# Patient Record
Sex: Female | Born: 1935 | Race: White | Hispanic: No | Smoking: Former smoker
Health system: Southern US, Community
[De-identification: ages and names within clinical notes are randomized; demographics above are authoritative.]

## PROBLEM LIST (undated history)

## (undated) DIAGNOSIS — M1990 Unspecified osteoarthritis, unspecified site: Secondary | ICD-10-CM

## (undated) DIAGNOSIS — I429 Cardiomyopathy, unspecified: Secondary | ICD-10-CM

## (undated) DIAGNOSIS — IMO0001 Reserved for inherently not codable concepts without codable children: Secondary | ICD-10-CM

## (undated) DIAGNOSIS — Z95810 Presence of automatic (implantable) cardiac defibrillator: Secondary | ICD-10-CM

## (undated) DIAGNOSIS — D649 Anemia, unspecified: Secondary | ICD-10-CM

## (undated) DIAGNOSIS — G2581 Restless legs syndrome: Secondary | ICD-10-CM

## (undated) DIAGNOSIS — I4891 Unspecified atrial fibrillation: Secondary | ICD-10-CM

## (undated) DIAGNOSIS — K219 Gastro-esophageal reflux disease without esophagitis: Secondary | ICD-10-CM

## (undated) DIAGNOSIS — R7881 Bacteremia: Secondary | ICD-10-CM

## (undated) DIAGNOSIS — Z8719 Personal history of other diseases of the digestive system: Secondary | ICD-10-CM

## (undated) DIAGNOSIS — K259 Gastric ulcer, unspecified as acute or chronic, without hemorrhage or perforation: Secondary | ICD-10-CM

## (undated) DIAGNOSIS — I509 Heart failure, unspecified: Secondary | ICD-10-CM

## (undated) DIAGNOSIS — A159 Respiratory tuberculosis unspecified: Secondary | ICD-10-CM

## (undated) DIAGNOSIS — J45909 Unspecified asthma, uncomplicated: Secondary | ICD-10-CM

## (undated) DIAGNOSIS — Z950 Presence of cardiac pacemaker: Secondary | ICD-10-CM

## (undated) DIAGNOSIS — J439 Emphysema, unspecified: Secondary | ICD-10-CM

## (undated) DIAGNOSIS — K5190 Ulcerative colitis, unspecified, without complications: Secondary | ICD-10-CM

## (undated) DIAGNOSIS — K56609 Unspecified intestinal obstruction, unspecified as to partial versus complete obstruction: Secondary | ICD-10-CM

## (undated) DIAGNOSIS — J449 Chronic obstructive pulmonary disease, unspecified: Secondary | ICD-10-CM

## (undated) DIAGNOSIS — K5790 Diverticulosis of intestine, part unspecified, without perforation or abscess without bleeding: Secondary | ICD-10-CM

## (undated) HISTORY — PX: CARDIAC CATHETERIZATION: SHX172

## (undated) HISTORY — PX: CHOLECYSTECTOMY: SHX55

## (undated) HISTORY — PX: EYE SURGERY: SHX253

## (undated) HISTORY — PX: COLON SURGERY: SHX602

---

## 1988-02-26 DIAGNOSIS — I429 Cardiomyopathy, unspecified: Secondary | ICD-10-CM

## 1988-02-26 HISTORY — PX: APPENDECTOMY: SHX54

## 1988-02-26 HISTORY — PX: ABDOMINAL HYSTERECTOMY: SHX81

## 1988-02-26 HISTORY — DX: Cardiomyopathy, unspecified: I42.9

## 1990-02-25 HISTORY — PX: COLOSTOMY: SHX63

## 1991-02-26 HISTORY — PX: COLOSTOMY TAKEDOWN: SHX5783

## 2001-02-25 HISTORY — PX: PACEMAKER INSERTION: SHX728

## 2003-02-22 ENCOUNTER — Other Ambulatory Visit: Payer: Self-pay

## 2003-09-08 ENCOUNTER — Other Ambulatory Visit: Payer: Self-pay

## 2003-11-28 ENCOUNTER — Ambulatory Visit: Payer: Self-pay | Admitting: Unknown Physician Specialty

## 2003-12-23 ENCOUNTER — Inpatient Hospital Stay: Payer: Self-pay

## 2003-12-23 ENCOUNTER — Other Ambulatory Visit: Payer: Self-pay

## 2004-01-23 ENCOUNTER — Other Ambulatory Visit: Payer: Self-pay

## 2004-01-24 ENCOUNTER — Inpatient Hospital Stay: Payer: Self-pay | Admitting: Internal Medicine

## 2004-03-28 ENCOUNTER — Ambulatory Visit: Payer: Self-pay | Admitting: Internal Medicine

## 2004-04-23 ENCOUNTER — Other Ambulatory Visit: Payer: Self-pay

## 2004-04-23 ENCOUNTER — Inpatient Hospital Stay: Payer: Self-pay | Admitting: Internal Medicine

## 2005-02-25 DIAGNOSIS — R7881 Bacteremia: Secondary | ICD-10-CM

## 2005-02-25 HISTORY — PX: VENTRICULAR CARDIAC PACEMAKER INSERTION: SHX836

## 2005-02-25 HISTORY — DX: Bacteremia: R78.81

## 2005-03-04 ENCOUNTER — Ambulatory Visit: Payer: Self-pay

## 2005-03-20 ENCOUNTER — Ambulatory Visit: Payer: Self-pay

## 2005-04-30 ENCOUNTER — Ambulatory Visit: Payer: Self-pay | Admitting: Unknown Physician Specialty

## 2005-05-27 ENCOUNTER — Ambulatory Visit: Payer: Self-pay | Admitting: Internal Medicine

## 2005-07-29 ENCOUNTER — Other Ambulatory Visit: Payer: Self-pay

## 2005-07-29 ENCOUNTER — Inpatient Hospital Stay: Payer: Self-pay | Admitting: Internal Medicine

## 2005-07-30 ENCOUNTER — Other Ambulatory Visit: Payer: Self-pay

## 2005-08-30 ENCOUNTER — Emergency Department: Payer: Self-pay | Admitting: Emergency Medicine

## 2005-11-30 ENCOUNTER — Other Ambulatory Visit: Payer: Self-pay

## 2005-11-30 ENCOUNTER — Inpatient Hospital Stay: Payer: Self-pay | Admitting: Vascular Surgery

## 2006-04-12 ENCOUNTER — Inpatient Hospital Stay: Payer: Self-pay | Admitting: Internal Medicine

## 2006-07-10 ENCOUNTER — Ambulatory Visit: Payer: Self-pay | Admitting: Internal Medicine

## 2006-08-26 ENCOUNTER — Ambulatory Visit: Payer: Self-pay | Admitting: Specialist

## 2007-07-21 ENCOUNTER — Ambulatory Visit: Payer: Self-pay | Admitting: Internal Medicine

## 2007-07-28 ENCOUNTER — Encounter: Payer: Self-pay | Admitting: Specialist

## 2008-12-02 ENCOUNTER — Inpatient Hospital Stay: Payer: Self-pay | Admitting: Cardiology

## 2008-12-02 ENCOUNTER — Ambulatory Visit: Payer: Self-pay | Admitting: Cardiology

## 2008-12-07 ENCOUNTER — Ambulatory Visit: Payer: Self-pay | Admitting: Internal Medicine

## 2009-06-04 ENCOUNTER — Inpatient Hospital Stay: Payer: Self-pay | Admitting: Internal Medicine

## 2010-03-24 ENCOUNTER — Inpatient Hospital Stay: Payer: Self-pay | Admitting: Surgery

## 2010-10-02 ENCOUNTER — Inpatient Hospital Stay: Payer: Self-pay | Admitting: Internal Medicine

## 2010-10-16 ENCOUNTER — Inpatient Hospital Stay: Payer: Self-pay | Admitting: Surgery

## 2011-06-30 ENCOUNTER — Emergency Department: Payer: Self-pay | Admitting: Emergency Medicine

## 2011-08-26 ENCOUNTER — Inpatient Hospital Stay: Payer: Self-pay | Admitting: Internal Medicine

## 2011-08-26 LAB — COMPREHENSIVE METABOLIC PANEL
Albumin: 3.8 g/dL (ref 3.4–5.0)
Alkaline Phosphatase: 91 U/L (ref 50–136)
Anion Gap: 9 (ref 7–16)
BUN: 39 mg/dL — ABNORMAL HIGH (ref 7–18)
Bilirubin,Total: 0.5 mg/dL (ref 0.2–1.0)
Creatinine: 1.17 mg/dL (ref 0.60–1.30)
Glucose: 133 mg/dL — ABNORMAL HIGH (ref 65–99)
SGOT(AST): 32 U/L (ref 15–37)
SGPT (ALT): 26 U/L
Total Protein: 7.9 g/dL (ref 6.4–8.2)

## 2011-08-26 LAB — CBC
HCT: 36.9 % (ref 35.0–47.0)
HGB: 12.2 g/dL (ref 12.0–16.0)
MCH: 32.4 pg (ref 26.0–34.0)
MCHC: 33 g/dL (ref 32.0–36.0)
RDW: 15 % — ABNORMAL HIGH (ref 11.5–14.5)
WBC: 15.9 10*3/uL — ABNORMAL HIGH (ref 3.6–11.0)

## 2011-08-26 LAB — CK TOTAL AND CKMB (NOT AT ARMC)
CK, Total: 63 U/L (ref 21–215)
CK-MB: 1.6 ng/mL (ref 0.5–3.6)

## 2011-08-26 LAB — URINALYSIS, COMPLETE
Bacteria: NONE SEEN
Bilirubin,UR: NEGATIVE
Glucose,UR: NEGATIVE mg/dL (ref 0–75)
Hyaline Cast: 17
Leukocyte Esterase: NEGATIVE
Nitrite: NEGATIVE
Protein: NEGATIVE
RBC,UR: 3 /HPF (ref 0–5)
Specific Gravity: 1.017 (ref 1.003–1.030)
WBC UR: <1 /HPF (ref 0–5)

## 2011-08-26 LAB — MAGNESIUM: Magnesium: 2.3 mg/dL

## 2011-08-26 LAB — DIGOXIN LEVEL: Digoxin: 0.83 ng/mL

## 2011-08-26 LAB — TROPONIN I: Troponin-I: <0.02 ng/mL

## 2011-08-27 LAB — BASIC METABOLIC PANEL
Anion Gap: 6 — ABNORMAL LOW (ref 7–16)
Calcium, Total: 7.5 mg/dL — ABNORMAL LOW (ref 8.5–10.1)
Creatinine: 1 mg/dL (ref 0.60–1.30)
EGFR (African American): >60
EGFR (Non-African Amer.): 52 — ABNORMAL LOW
Glucose: 96 mg/dL (ref 65–99)
Osmolality: 293 (ref 275–301)
Potassium: 4.1 mmol/L (ref 3.5–5.1)
Sodium: 144 mmol/L (ref 136–145)

## 2011-08-27 LAB — CBC WITH DIFFERENTIAL/PLATELET
Basophil %: 0.5 %
MCH: 32.6 pg (ref 26.0–34.0)
MCHC: 32.6 g/dL (ref 32.0–36.0)
Neutrophil %: 68.4 %
Platelet: 126 10*3/uL — ABNORMAL LOW (ref 150–440)
WBC: 7.3 10*3/uL (ref 3.6–11.0)

## 2011-08-28 LAB — RETICULOCYTES: Absolute Retic Count: 0.0338 10*6/uL (ref 0.024–0.084)

## 2011-08-28 LAB — BASIC METABOLIC PANEL
Anion Gap: 5 — ABNORMAL LOW (ref 7–16)
BUN: 12 mg/dL (ref 7–18)
Chloride: 113 mmol/L — ABNORMAL HIGH (ref 98–107)
Creatinine: 0.84 mg/dL (ref 0.60–1.30)
EGFR (Non-African Amer.): >60
Glucose: 93 mg/dL (ref 65–99)
Osmolality: 286 (ref 275–301)
Potassium: 4.3 mmol/L (ref 3.5–5.1)

## 2011-08-28 LAB — CBC WITH DIFFERENTIAL/PLATELET
Basophil #: 0 10*3/uL (ref 0.0–0.1)
Basophil %: 0.3 %
Eosinophil #: 0.1 10*3/uL (ref 0.0–0.7)
Eosinophil %: 1.6 %
HGB: 9.9 g/dL — ABNORMAL LOW (ref 12.0–16.0)
Lymphocyte #: 1.3 10*3/uL (ref 1.0–3.6)
MCH: 33.3 pg (ref 26.0–34.0)
MCHC: 33.2 g/dL (ref 32.0–36.0)
MCV: 100 fL (ref 80–100)
Monocyte #: 0.7 x10 3/mm (ref 0.2–0.9)
Neutrophil %: 56.8 %
Platelet: 116 10*3/uL — ABNORMAL LOW (ref 150–440)
RDW: 14.9 % — ABNORMAL HIGH (ref 11.5–14.5)

## 2011-08-28 LAB — FOLATE: Folic Acid: 54.1 ng/mL (ref 3.1–100.0)

## 2011-08-29 LAB — CBC WITH DIFFERENTIAL/PLATELET
Basophil #: 0 10*3/uL (ref 0.0–0.1)
Basophil %: 0.7 %
Eosinophil %: 2.8 %
Eosinophil: 2 %
HCT: 29.1 % — ABNORMAL LOW (ref 35.0–47.0)
Lymphocyte #: 1.7 10*3/uL (ref 1.0–3.6)
Lymphocyte %: 34.2 %
Lymphocytes: 30 %
MCV: 99 fL (ref 80–100)
Monocyte %: 12.9 %
Neutrophil #: 2.4 10*3/uL (ref 1.4–6.5)
RBC: 2.93 10*6/uL — ABNORMAL LOW (ref 3.80–5.20)
Segmented Neutrophils: 54 %
Variant Lymphocyte - H1-Rlymph: 1 %
WBC: 4.9 10*3/uL (ref 3.6–11.0)

## 2011-08-29 LAB — PROTEIN ELECTROPHORESIS(ARMC)

## 2011-08-30 LAB — BASIC METABOLIC PANEL
Anion Gap: 7 (ref 7–16)
Calcium, Total: 8.8 mg/dL (ref 8.5–10.1)
Co2: 27 mmol/L (ref 21–32)
Creatinine: 0.77 mg/dL (ref 0.60–1.30)
EGFR (African American): >60
Osmolality: 279 (ref 275–301)
Potassium: 4.2 mmol/L (ref 3.5–5.1)
Sodium: 140 mmol/L (ref 136–145)

## 2011-08-30 LAB — CBC WITH DIFFERENTIAL/PLATELET
Basophil #: 0.1 10*3/uL (ref 0.0–0.1)
Basophil %: 0.8 %
Eosinophil #: 0.1 10*3/uL (ref 0.0–0.7)
Eosinophil %: 2 %
HCT: 31.1 % — ABNORMAL LOW (ref 35.0–47.0)
HGB: 10.5 g/dL — ABNORMAL LOW (ref 12.0–16.0)
Lymphocyte #: 2.1 10*3/uL (ref 1.0–3.6)
Lymphocyte %: 31.1 %
MCHC: 33.8 g/dL (ref 32.0–36.0)
Monocyte #: 0.8 x10 3/mm (ref 0.2–0.9)
Neutrophil #: 3.7 10*3/uL (ref 1.4–6.5)
RDW: 14.5 % (ref 11.5–14.5)

## 2011-09-02 LAB — UR PROT ELECTROPHORESIS, URINE RANDOM

## 2011-11-17 ENCOUNTER — Inpatient Hospital Stay: Payer: Self-pay | Admitting: Surgery

## 2011-11-17 LAB — COMPREHENSIVE METABOLIC PANEL
Albumin: 3.7 g/dL (ref 3.4–5.0)
Alkaline Phosphatase: 85 U/L (ref 50–136)
Calcium, Total: 9.4 mg/dL (ref 8.5–10.1)
Co2: 30 mmol/L (ref 21–32)
Creatinine: 1.37 mg/dL — ABNORMAL HIGH (ref 0.60–1.30)
EGFR (African American): 42 — ABNORMAL LOW
EGFR (Non-African Amer.): 36 — ABNORMAL LOW
Glucose: 163 mg/dL — ABNORMAL HIGH (ref 65–99)
Osmolality: 291 (ref 275–301)
SGPT (ALT): 22 U/L (ref 12–78)

## 2011-11-17 LAB — URINALYSIS, COMPLETE
Ketone: NEGATIVE
Nitrite: NEGATIVE
Ph: 5 (ref 4.5–8.0)
Protein: NEGATIVE
RBC,UR: 37 /HPF (ref 0–5)
Specific Gravity: 1.017 (ref 1.003–1.030)

## 2011-11-17 LAB — CBC
HCT: 36.4 % (ref 35.0–47.0)
HGB: 12.4 g/dL (ref 12.0–16.0)
MCH: 33.2 pg (ref 26.0–34.0)
MCHC: 34 g/dL (ref 32.0–36.0)
MCV: 98 fL (ref 80–100)
RBC: 3.73 10*6/uL — ABNORMAL LOW (ref 3.80–5.20)

## 2011-11-18 LAB — BASIC METABOLIC PANEL
BUN: 24 mg/dL — ABNORMAL HIGH (ref 7–18)
Calcium, Total: 7.6 mg/dL — ABNORMAL LOW (ref 8.5–10.1)
Chloride: 112 mmol/L — ABNORMAL HIGH (ref 98–107)
Glucose: 129 mg/dL — ABNORMAL HIGH (ref 65–99)
Osmolality: 293 (ref 275–301)
Potassium: 4.6 mmol/L (ref 3.5–5.1)

## 2011-11-18 LAB — CBC WITH DIFFERENTIAL/PLATELET
Basophil #: 0 10*3/uL (ref 0.0–0.1)
Basophil %: 0.3 %
Eosinophil %: 1.1 %
HGB: 9.5 g/dL — ABNORMAL LOW (ref 12.0–16.0)
Lymphocyte #: 1.8 10*3/uL (ref 1.0–3.6)
Lymphocyte %: 20 %
MCH: 33.7 pg (ref 26.0–34.0)
MCHC: 34 g/dL (ref 32.0–36.0)
MCV: 99 fL (ref 80–100)
Monocyte #: 0.9 x10 3/mm (ref 0.2–0.9)
Monocyte %: 10.4 %
Neutrophil %: 68.2 %
Platelet: 138 10*3/uL — ABNORMAL LOW (ref 150–440)
RBC: 2.83 10*6/uL — ABNORMAL LOW (ref 3.80–5.20)
WBC: 9 10*3/uL (ref 3.6–11.0)

## 2012-05-22 ENCOUNTER — Emergency Department: Payer: Self-pay | Admitting: Emergency Medicine

## 2012-05-22 LAB — CBC WITH DIFFERENTIAL/PLATELET
Basophil #: 0 10*3/uL (ref 0.0–0.1)
Eosinophil #: 0 10*3/uL (ref 0.0–0.7)
Eosinophil %: 0.5 %
HCT: 33.2 % — ABNORMAL LOW (ref 35.0–47.0)
HGB: 11.5 g/dL — ABNORMAL LOW (ref 12.0–16.0)
Lymphocyte #: 0.9 10*3/uL — ABNORMAL LOW (ref 1.0–3.6)
Lymphocyte %: 10.2 %
MCH: 33.5 pg (ref 26.0–34.0)
MCHC: 34.6 g/dL (ref 32.0–36.0)
MCV: 97 fL (ref 80–100)
Neutrophil #: 6.9 10*3/uL — ABNORMAL HIGH (ref 1.4–6.5)
Neutrophil %: 78.1 %
RBC: 3.43 10*6/uL — ABNORMAL LOW (ref 3.80–5.20)

## 2012-05-22 LAB — COMPREHENSIVE METABOLIC PANEL
Albumin: 3.1 g/dL — ABNORMAL LOW (ref 3.4–5.0)
Anion Gap: 6 — ABNORMAL LOW (ref 7–16)
BUN: 60 mg/dL — ABNORMAL HIGH (ref 7–18)
Bilirubin,Total: 0.5 mg/dL (ref 0.2–1.0)
Calcium, Total: 8.6 mg/dL (ref 8.5–10.1)
Co2: 26 mmol/L (ref 21–32)
Creatinine: 1.26 mg/dL (ref 0.60–1.30)
EGFR (African American): 46 — ABNORMAL LOW
EGFR (Non-African Amer.): 40 — ABNORMAL LOW
Glucose: 141 mg/dL — ABNORMAL HIGH (ref 65–99)
Osmolality: 288 (ref 275–301)
SGOT(AST): 37 U/L (ref 15–37)
SGPT (ALT): 24 U/L (ref 12–78)
Sodium: 134 mmol/L — ABNORMAL LOW (ref 136–145)
Total Protein: 7.3 g/dL (ref 6.4–8.2)

## 2012-05-22 LAB — PROTIME-INR
INR: 1
Prothrombin Time: 13.2 secs (ref 11.5–14.7)

## 2014-06-14 NOTE — Discharge Summary (Signed)
PATIENT NAME:  Taylor, Abbott MR#:  [id number] DATE OF BIRTH:  October 25, 1927  DATE OF ADMISSION:  11/17/2011 DATE OF DISCHARGE:  11/20/2011  BRIEF HISTORY: [name] is an 81 year old woman with a previous history of multiple small bowel obstructions. She has a well known ventral hernia which has been followed for some time which is incarcerated but not strangulated. She has had multiple episodes of small bowel obstruction which normally resolves spontaneously. She presented again on the 22nd with onset of similar symptoms to her previous admissions. She was nauseated and vomited multiple times but actually began to feel better by the time she presented to the Emergency Room. CT scan was performed which revealed changes consistent with a partial small bowel obstruction although there was no evidence that the hernia, which is chronic, was involved in the obstruction. She was admitted to the hospital for IV rehydration and bowel rest. Her symptoms began to resolve very quickly. We started her on a liquid diet on the 23rd and was able to advance to a soft diet on the 24th. She did have a bowel movement this morning. Her abdomen is soft, although she continues to have an incarcerated hernia. We talked to her about the risks and benefits of surgical intervention. Because her symptoms have resolved, will plan discharge today.    DISCHARGE MEDICATIONS:  1. Advair Diskus 100 mcg/50 mcg b.i.d.  2. Albuterol 90 mcg inhaler 2 puffs b.i.d. p.r.n.  3. Amitriptyline 75 mg p.o. at bedtime. 4. Aspirin 81 mg p.o. daily.  5. Celebrex 200 mg p.o. b.i.d.  6. Cozaar 1 mg p.o. daily.  7. Digoxin 0.125 mg p.o. daily.  8. Colace 100 mg p.o. daily.  9. Furosemide 20 mg 3 tablets p.o. daily. 10. MiraLAX oral powder 1 capful q.3 days.  11. Omeprazole 20 mg p.o. daily.  12. Spiriva 18 mcg inhaled once a day.       FINAL DISCHARGE DIAGNOSIS: Partial small bowel obstruction.   ____________________________ Quentin Ore  III, MD rle:drc D: 11/20/2011 14:09:26 ET T: 11/21/2011 11:48:37 ET JOB#: [id number]  cc: Carmie End, MD, <Dictator> Lynnea Ferrier, MD Quentin Ore MD ELECTRONICALLY SIGNED 11/21/2011 18:21

## 2014-06-14 NOTE — H&P (Signed)
PATIENT NAME:  [name], [name] MR#:  [id number] DATE OF BIRTH:  April 18, 1927  DATE OF ADMISSION:  11/17/2011  ADMITTING DIAGNOSIS:  1. Nausea, vomiting, diarrhea, and abdominal pain.  2. History of partial small bowel obstruction.   HISTORY:   This is an 81 year old white female with a complex past surgical history who has been admitted to the hospital on multiple occasions with partial small bowel obstruction, all of which have resolved with either nasogastric tube decompression and hydration and never requiring surgical intervention. The patient recently was discharged in July of this year with similar type complaints which resolved nonoperatively. Her last admission to the hospital was in August 2012 by our service at which point she had a partial small bowel obstruction and chronic constipation. The patient resolved with nonoperative intervention. 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 Her history also dates back to a previous open cholecystectomy through a right paramedian incision.    The patient was in her usual state of health dealing with her chronic constipation with MiraLAX on an every day or every other day basis up until last night. At around 10:00 last night the patient had profuse emesis followed by several episodes of watery stools and some mild abdominal pain located in her mid abdomen. Prior to this, there was no prodrome of abdominal distention. No sick contacts. No fevers. No recent antibiotic usage. She is brought by EMS after symptoms persisted. While in the Emergency Room, the patient was found to have somewhat low blood pressure which responded nicely to fluid bolus. A CT scan was obtained which I have personally reviewed. It is consistent with partial small bowel obstruction and significant amount of  constipation. There is a distended stomach. While she has been in the Emergency Room, no further emesis has been noted. She states that she is already feeling better.   ALLERGIES: ACE inhibitors and metoprolol.   MEDICATIONS:  1. Advair Diskus. 2. Albuterol. 3. Amitriptyline. 4. Aspirin. 5. Celebrex. 6. Cozaar. 7. Digoxin. 8. Lasix. 9. MiraLAX. 10. Omeprazole. 11. Spiriva.   PAST MEDICAL HISTORY:  1. Chronic constipation.  2. History of intermittent partial small bowel obstruction.  3. History of colovesicular fistula.  4. History of cardiomyopathy, status post implanted cardioverter defibrillator and pacemaker.  5. History of sinus node dysfunction.  6. History of chronic obstructive pulmonary disease.  7. History of depression.   PAST SURGICAL HISTORY:  1. Colectomy. 2. Colostomy. the reverasl of colostomy 3. Anterior and posterior colporrhaphies. 4. Ventral hernia repair with mesh. 5. Hysterectomy. 6. Open cholecystectomy.  SOCIAL HABITS: Ex-smoker. No history of alcohol or drug use.   FAMILY HISTORY: Nonsignificant.   SOCIAL HISTORY: Lives at home alone, widowed. Cared for by multiple sons.   PHYSICAL EXAMINATION:  GENERAL: She is in no obvious distress. She is alert and oriented.   VITAL SIGNS: Temperature 96, pulse 86, blood pressure 80/50 initially, 5 foot 3 inches, 165 pounds, BMI 29.2.   GENERAL: Affect is normal. Extraocular muscles are intact. Facies are symmetrical.   NECK: Supple. No adenopathy or thyromegaly.   LUNGS: Clear bilaterally.   HEART: Regular rate and rhythm.   ABDOMEN: Abdomen demonstrates multiple scars. There is a right lower midline reducible ventral hernia. I appreciate no peritoneal signs. No other masses. No tenderness.   EXTREMITIES: Warm and well perfused.   MUSCULOSKELETAL: Normal.  RECTAL: Deferred.   SKIN: Warm and well perfused.   PSYCHIATRIC: Appropriate mood, judgment, and affect.   LABORATORY VALUES: Urinalysis  negative. White count 16.4, hemoglobin 12.4, hematocrit 36.4, platelet count 194,000. Liver function tests are normal. Creatinine is 1.37. BUN 31, glucose 163, potassium 4.0, sodium 141, chloride 101, CO2 30, total calcium 9.4.   IMPRESSION: Recurrent abdominal pain, nausea, vomiting, and diarrhea. Question of partial small bowel obstruction was raised on CT scan; however, in light of her diarrhea I suspect another etiology. Certainly partial small bowel obstruction can result in diarrhea. However, there is no prodrome of abdominal distention and pain prior to her symptoms starting last night. She does have an elevated white count. She did this last time in July which responded to hydration. She looks to be mildly dehydrated based on her BUN and creatinine and low blood pressure.   PLAN:  1. At present I plan no nasogastric tube insertion.  2. I suggest aggressive hydration with fluid bolus and foley catheter placement. 3. Serial abdominal examinations and repeat x-rays in the morning.  4. We will continue her Advair Diskus, albuterol, blood pressure medicines and digoxin once her blood pressure has stabilized.    All of her questions were answered. Dr. Michela Pitcher will assume her care in the morning.  ____________________________ Redge Gainer. Egbert Garibaldi, MD mab:ap D: 11/17/2011 11:10:09 ET T: 11/17/2011 11:31:05 ET JOB#: [id number]  cc: Loraine Leriche A. Egbert Garibaldi, MD, <Dictator> Raynald Kemp MD ELECTRONICALLY SIGNED 11/17/2011 17:30

## 2014-06-17 NOTE — Consult Note (Signed)
PATIENT NAME:  [name], [name] MR#:  [id number] DATE OF BIRTH:  September 01, 1927  DATE OF CONSULTATION:  05/22/2012  REFERRING PHYSICIAN:      Emergency Department  CONSULTING PHYSICIAN:  Claude Manges, MD  HISTORY OF PRESENT ILLNESS: The patient is an 81 year old white female with a history of multiple small bowel obstructions requiring short-term admission to the hospital. She has been placed on MiraLAX and sometimes alternates with nausea, vomiting and diarrhea. She has a chronic ventral hernia which has been followed for some time and is incarcerated but has never strangulated. She also has a significant heart disease with cardiomyopathy and has an implanted automatic implantable cardiac defibrillator for sinus node dysfunction. In addition, she has chronic obstructive pulmonary disease and depression.   I was called to see the patient in the Emergency Department as she had recurrent abdominal pain by the time I saw the patient her abdominal pain had completely resolved. I did review her CT scan which showed a single loop of intestine in the left upper quadrant that was dilated maximally to 4.5 cm.  This mostly contained air. The remaining small intestine was decompressed but there was stool and gas throughout her colon which is typical for her. Her white blood cell count was 8.8 and her pain had resolved and she wished to go home and not be admitted to the hospital.   I believe this patient is suffering from chronic partial small bowel obstruction with bacterial overgrowth syndrome in the dilated loop of intestine. Therefore I placed her on long-term chronic Flagyl and explained to her the high rate of metallic taste sensation in patients taking this medication she said that she chewed gum all the time for her dry mouth and she felt that she would be able to take it. I advised her to follow with both Dr. Michela Pitcher and Dr. Graciela Husbands and let them know that she had been placed on long-term Flagyl and if this help  her symptomatically, she should likely remain on it indefinitely.     ____________________________ Claude Manges, MD wfm:ct D: 05/22/2012 08:24:19 ET T: 05/22/2012 09:10:02 ET JOB#: [id number]  cc: Claude Manges, MD, <Dictator> Lynnea Ferrier, MD Quentin Ore III, MD Claude Manges MD ELECTRONICALLY SIGNED 05/24/2012 10:55

## 2014-06-19 NOTE — Consult Note (Signed)
PATIENT NAME:  [name], SHIROMA MR#:  [id number] DATE OF BIRTH:  04-Sep-1927  DATE OF CONSULTATION:  08/26/2011  REFERRING PHYSICIAN:   CONSULTING PHYSICIAN:  Adella Hare, MD  HISTORY OF PRESENT ILLNESS: This 81 year old female was admitted emergently with chief complaint of abdominal pain. She reports the pain was in the umbilical area, of moderate severity. She also was nauseated and vomited two times in the ambulance. She did not see what she threw up.   Does not know if it had any bile or blood in it, but does not report any knowledge of any bleeding. She also had diarrhea four times around the time of admission and some of it was incontinent. She has no knowledge of any blood in the bowel movement. She also was found to be hypoxic during her Emergency Room evaluation and was started on some oxygen, although the patient reported no actual dyspnea. She reports no associated chills or fever. She was initially evaluated by the Emergency Room staff and referred for internal medicine admission. She did have a CT scan which was consistent with small intestinal obstruction. She was admitted for hospital care and later had follow-up three-way x-ray of the abdomen this morning, which did show some gas in the small bowel but still was suspicious for small intestinal obstruction. The patient reports that she has had no further nausea or vomiting since admission and also her abdominal pain has resolved. She reports that she had a large formed bowel movement just a short time prior to surgical consultation and also passed some flatus. She had been unable to empty her bladder and just in the last hour had a catheter inserted, draining 700 mL of urine, and now has an indwelling Foley catheter. The patient  reports she feels more comfortable now.   PAST MEDICAL HISTORY:  1. Cardiomyopathy, which dates back to 17. 2. History of atrial fibrillation and congestive heart failure. Does have a biventricular pacemaker and  defibrillator which was implanted in 2007. She reports her pacemaker wires have been infected in the past but was treated with antibiotics and resolved.  3. She has a history of chronic obstructive pulmonary disease for some 20 years' duration. Also describes emphysema and asthma. Recently has been fairly stable with this, has had no episodes of dyspnea but was mildly hypoxic on admission. 4. History of diverticulitis and colovaginal fistula. Had partial colectomy in 1992 with temporary colostomy, which was closed, and since then has had multiple bowel obstructions but not needing further intestinal surgery. She has developed a large ventral hernia but has been told she was in too poor medical condition to have this fixed. 5. 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  6. She has a history of multiple ulcers, history of ulcerative colitis. 7. History of spinal stenosis.   PAST SURGICAL HISTORY:  1. Hysterectomy.  2. Cholecystectomy.  3. Partial colectomy with temporary colostomy.   MEDICATIONS:  1. Aspirin 81 mg daily.  2. Amitriptyline 75 mg daily.  3. Lasix 60 mg daily.  4. Digoxin 0.124 mg daily.  5. Prilosec 20 mg b.i.d.  6. Celebrex 200 mg b.i.d.  7. Cozaar 25 mg daily.  8. Spiriva daily.  9. Advair inhaler 100 b.i.d.  10. Albuterol inhaler 2 puffs 2 times per day.  11. MiraLAX, sometimes every day and sometimes every other day.   ALLERGIES: ACE inhibitors, Toprol, Cardizem.   SOCIAL HISTORY: Her husband died a number of years ago. She smoked in  the past but quit six years ago. Does not drink any alcohol.   FAMILY HISTORY: Positive for uterine cancer and heart disease.   REVIEW OF SYSTEMS: She reports no recent acute illness such as cough, cold, or sore throat. She recently has had eye evaluation and wears corrective lenses but no recent eye problems. She reports no swollen glands. She does occasionally get some ankle edema. She has not had any recent  dyspnea. She does generally walk slowly. She uses a walker due to difficulties with balance. She does do a fair amount of walking with the walker. She reports that prior to this illness she had been eating satisfactorily and actually had a fair amount of vegetables and fiber intake in recent days. She reports she does have a history of constipation but managing with the MiraLAX. She reports some occasional urinary incontinence, also occasional fecal incontinence. She reports no recent chest pains, occasionally may get some gaseous discomfort in the chest.  Review of systems is otherwise negative.    PHYSICAL EXAMINATION:  GENERAL: She is awake, alert, oriented.  VITAL SIGNS: Temperature 98.9, pulse 86, respirations 18, blood pressure 93/59, pulse oximetry 90% on 2 liters oxygen.   SKIN: Warm and dry without rash.   HEENT: Pupils equal, reactive to light. Extraocular movements are intact. Sclerae clear. Pharynx clear.   NECK: No palpable mass.   LUNGS: Lungs sounds were clear.   HEART: Irregular rhythm, S1 and S2. Has palpable defibrillator.  ABDOMEN: Large ventral hernia which is in the lower abdomen and to the right of the midline with a prominent bulge of some 12 to 15 cm in dimension, which is easily reducible. The abdomen otherwise does not appear to be distended. It is soft and nontender with no palpable mass.   RECTAL: Rectal exam demonstrates hypotonic sphincter. No palpable rectal mass.   EXTREMITIES: No dependent edema.   NEUROLOGIC: Awake, alert, and oriented, moving all extremities. Does have an indwelling Foley catheter.   I reviewed her CT images which are consistent with small intestinal obstruction. I also reviewed her three-way x-ray which is also consistent with small bowel obstruction but did show a small amount of gas in the colon.     CLINICAL DATA: Glucose is 133, BUN 39, creatinine 1.17. Liver panel normal. Cardiac panel normal. Digoxin 0.83, in the therapeutic  range. White blood count 15,900, hemoglobin 12.2, platelet count normal.     IMPRESSION:  1. Evidence of small bowel obstruction with current improvement in symptoms. 2. Large ventral hernia.   I discussed this with her, discussed a period of observation. Does not appear to need urgent surgery at present. We will give some time for observation and see if this resolves. I don't think we necessarily need any nasogastric tube at present as she is currently minimally symptomatic.   I will plan another x-ray of the abdomen tomorrow. Keep her n.p.o. for now.    ____________________________ J. Renda Rolls, MD jws:bjt D: 08/26/2011 19:32:05 ET T: 08/27/2011 11:06:10 ET JOB#: [id number] (Entered as 05)  cc: Adella Hare, MD, <Dictator> Adella Hare MD ELECTRONICALLY SIGNED 08/29/2011 12:47

## 2014-06-19 NOTE — H&P (Signed)
PATIENT NAME:  [name], [name] MR#:  [id number] DATE OF BIRTH:  1927/11/28  DATE OF ADMISSION:  08/26/2011  PRIMARY CARE PHYSICIAN: Daniel Nones, MD  CHIEF COMPLAINT: Abdominal pain.   HISTORY OF PRESENT ILLNESS: [name] is an 81 year old pleasant Caucasian female with history of recurrent small bowel obstruction. She has ventral abdominal hernia. She did not seek surgical repair as she was told that her general medical problems made it risky for her to have the operation. The patient was in her usual state of health until the last 24 hours and particular this morning when she started to have central abdominal pain located around the umbilical area. The pain was escalating, described as constant hurt with severity of 8 on a scale of 10. It worsened around 11:00 a.m. associated with two episodes of vomiting without hematemesis, no coffee-ground emesis. The patient also reports diarrhea, at least four bowel movements today. She denies any fever, no chills. The patient came to the Emergency Department for evaluation and a CAT scan here was consistent with high-grade small bowel obstruction. The ER physician tells me that he consulted the surgical team but they told him that since she has diarrhea she does not have bowel obstruction. Therefore, the medical team was consulted.   REVIEW OF SYSTEMS: CONSTITUTIONAL: Denies any fever. No chills. No fatigue. EYES: No blurring of vision. No double vision. ENT: No hearing impairment. No sore throat. No dysphagia. CARDIOVASCULAR: No chest pain. No shortness of breath. No syncope although she reported that she nearly fainted when the pain was severe at home. RESPIRATORY: No shortness of breath. No cough. No sputum production. No hemoptysis. GASTROINTESTINAL: Reports abdominal pain as above, nausea, two episodes of vomiting and four episodes of diarrhea. No hematochezia. No melena. GENITOURINARY: No dysuria. No frequency of urination. MUSCULOSKELETAL: No joint pain or  swelling. No muscular pain or swelling. INTEGUMENTARY: No skin rash. No ulcers. NEUROLOGY: No focal weakness. No seizure activity. No headache. PSYCHIATRY: No anxiety. No depression. ENDOCRINE: No polyuria or polydipsia. No heat or cold intolerance.      PAST MEDICAL HISTORY:  1. History of small bowel obstruction and colovesical fistula. 2. History of cardiomyopathy status post implanted cardioverter defibrillator and pacemaker.  3. History of sinus node dysfunction. 4. Chronic obstructive pulmonary disease. 5. Depression.   PAST SURGICAL HISTORY:  1. Colectomy in the past.  2. Anterior and posterior repair of bladder. 3. Ventral hernia repair. 4. Hysterectomy.  5. Cholecystectomy.   FAMILY HISTORY: Her mother died from uterine cancer. Her father died in his 35s and he was suffering from heart problems.   SOCIAL HABITS: Ex-chronic smoker. She quit about six years ago. No history of alcohol or drug abuse.   SOCIAL HISTORY: She is widowed and lives at home alone and cared for by her sons.   ADMISSION MEDICATIONS:  1. Spiriva 1 inhalation once a day. 2. Albuterol inhalation 2 puffs twice a day p.r.n.  3. Advair Diskus 100/50 one inhalation twice a day. 4. Omeprazole 20 mg twice a day.  5. Norco 5/325 mg 1 to 2 tablets every six hours p.r.n.  6. MiraLax 17 grams once a day.  7. Magnesium hydroxide 30 mL p.r.n.  8. Furosemide 20 mg taking 3 tablets, 60 mg once a day.  9. Digoxin 0.125 mg once a day. 10. Cozaar 25 mg once a day. 11. Celebrex 200 mg once a day. 12. Colace 100 mg 2 tablets twice a day. 13. Aspirin 81 mg a day. 14. Amitriptyline  75 mg once a day at bedtime.   ALLERGIES: ACE inhibitor causing cough and metoprolol probably contraindicated due to her sinus atrial node dysfunction.   PHYSICAL EXAMINATION:   VITAL SIGNS: Blood pressure 107/65, pulse 64, respiratory rate 18, temperature 97.7, and oxygen saturation 93%.   GENERAL APPEARANCE: Elderly thin-looking lady,  very pleasant, lying down in bed in no acute distress, holding her back in her hand due to her nausea and attempted vomiting.   HEAD/NECK: No pallor. No icterus. No cyanosis. Hearing was normal. Nasal mucosa, tongue, and lips were normal.   EYES: Normal iris and conjunctivae. Pupils are about 6 mm, equal and reactive to light.   NECK: Supple. Trachea at midline. No thyromegaly. No cervical lymphadenopathy.   HEART: Normal S1 and S2. No S3 or S4. No murmur. No gallop. No carotid bruits.   RESPIRATORY: Normal breathing pattern without use of accessory muscles. Decreased breath sounds bilaterally. No rales. No wheezing.   ABDOMEN: Soft without tenderness, except some mild tenderness upon deep palpation of the periumbilical area. She has a large right paramedian ventral hernia. There are a few scar tissues from previous operations.   MUSCULOSKELETAL: No joint swelling. No clubbing.   SKIN: No ulcers. No subcutaneous nodules. She has prominent varicose veins of the lower extremities, in particular around the feet and ankles.  NEUROLOGIC: Cranial nerves II through XII are intact. No focal motor deficit.   PSYCHIATRY: The patient is alert and oriented x3. Mood and affect were normal.   LABORATORY, DIAGNOSTIC AND RADIOLOGIC DATA: CAT scan of the abdomen showed findings consistent with high-grade small bowel obstruction as interpreted by the radiologist. There are left paramedian fat-containing upper ventral hernias and right paramedian lower anterior abdominal wall hernia containing nondistended small bowel. Evidence of emphysema. Also reported small 9 x 7 mm posterior lateral left lower lobe irregular subpleural nodule versus focal subpleural scarring.   EKG showed ventricular pacemaker rhythm at rate of 84 per minute.   Blood work-up showed glucose of 133, BUN 39, creatinine 1.1, sodium 138, and potassium 3.9. Normal liver function tests. CPK 63. Troponin less than 0.02. Digoxin 0.83. CBC showed  white count 15,000, hemoglobin 12, hematocrit 36, and platelet count 165.   Urinalysis was unremarkable.   ASSESSMENT:  1. Abdominal pain with findings by CAT scan consistent with high-grade small bowel obstruction along with other multiple ventral hernias.  2. Diarrhea x4 episodes today associated also with two episodes of vomiting.  3. Dehydration evidenced by elevated BUN/creatinine ratio. 4. Chronic obstructive pulmonary disease, oxygen dependent.  5. History of cardiomyopathy status post implantable cardiac defibrillator and pacemaker insertion.  6. History of sinus nodal dysfunction.  7. History of colectomy, anterior and posterior repair of bladder, ventral hernia repair, hysterectomy, and cholecystectomy.   PLAN: Keep the patient n.p.o. Admit to the medical floor. IV hydration. Insert an NG tube. Consult general surgery. Keep oral medications on hold since the patient is vomiting as she likely has partial small bowel obstruction. I spoke with the patient regarding a Living Will. She does have one and she also appointed her son to have the power of attorney.   TIME SPENT EVALUATING THIS PATIENT: More than one hour including reviewing her medical records.  ____________________________ Carney Corners. Rudene Re, MD amd:slb D: 08/26/2011 03:26:19 ET     T: 08/26/2011 08:01:27 ET       JOB#: [id number] cc: Lynnea Ferrier, MD Zollie Scale MD ELECTRONICALLY SIGNED 08/26/2011 22:23

## 2014-06-19 NOTE — Discharge Summary (Signed)
PATIENT NAME:  [name], [name] MR#:  [id number] DATE OF BIRTH:  25-Jan-1928  DATE OF ADMISSION:  08/26/2011 DATE OF DISCHARGE:  08/30/2011  REASON FOR ADMISSION: Abdominal pain.   HISTORY OF PRESENT ILLNESS: Please see the admission note done by Dr. Rudene Re. The patient is an 81 year old lady who came in with abdominal pain, found to have recurrent small bowel obstruction. She had nausea and vomiting with no hematemesis. In the Emergency Room, the patient was found to have small bowel obstruction on x-ray and was admitted for further evaluation.   PAST MEDICAL HISTORY: 1. History of SBO with colovesicular fistula.  2. Cardiomyopathy, status post defibrillator and pacemaker placement.  3. History of sinus node dysfunction. 4. Chronic obstructive pulmonary disease.  5. Depression.  6. Status post partial colectomy.  7. Status post hysterectomy. 8. Status post cholecystectomy.  9. Status post ventral hernia repair.   MEDICATIONS ON ADMISSION: Please see admission note.   ALLERGIES: ACE inhibitors and metoprolol.   SOCIAL HISTORY, FAMILY HISTORY, AND REVIEW OF SYSTEMS: As per admission note.   PHYSICAL EXAM: The patient was in mild distress. Vital signs were stable and she was afebrile. HEENT exam was unremarkable. Neck was supple without JVD. Lungs revealed decreased breath sounds. Cardiac exam revealed a regular rate and rhythm. Normal S1 and S2. Abdomen was soft, except mild tenderness in the periumbilical area. Ventral hernia was noted. Extremities: Without edema. Neurologic exam was grossly nonfocal.   HOSPITAL COURSE: The patient was admitted with recurrent small bowel obstruction. She was admitted with NG tube and IV fluids. Surgery was consulted. Conservative measures were recommended. The patient's symptoms improved with hydration and the NG tube. She did develop some anemia and thrombocytopenia. She was taken off subcutaneous heparin, and this was monitored. She remained mildly anemic  and mildly thrombocytopenic during the hospitalization without evidence of active bleeding or complications from these diagnoses.   Her diet was initiated and subsequently advanced. She was advanced to a mechanical soft diet with good results. Her bowels were moving with Colace and MiraLax. She was up and ambulating. Oxygenation was stable on room air by the time of discharge.  She is now discharged home for further care and outpatient treatment.   DISCHARGE DIAGNOSES: 1. Recurrent small bowel obstruction, resolved.  2. Abdominal pain, resolved.  3. Chronic obstructive pulmonary disease.  4. Cardiomyopathy status post defibrillator placement.  5. History of sinus node dysfunction status post pacemaker implant.  6. Anemia.  7. Thrombocytopenia.   DISCHARGE MEDICATIONS: 1. Aspirin 81 mg p.o. daily.  2. Advair 100/50 one puff b.i.d.  3. Elavil 75 mg p.o. at bedtime.  4. Lanoxin 0.125 mg p.o. daily. 5. Omeprazole 20 mg p.o. b.i.d.  6. Cozaar 25 mg p.o. daily.  7. Spiriva 1 capsule inhaled daily.  8. Albuterol 2 puffs q.4 hours p.r.n. shortness of breath.  9. Colace 100 mg p.o. daily.  10. Lasix 40 mg p.o. daily. 11. MiraLax 17 grams p.o. b.i.d.  FOLLOWUP PLANS AND APPOINTMENTS:  1. The patient was told not to use Celebrex for the time being. 2.  She was discharged on a mechanical soft diet. 3. She will follow up with Dr. Alberteen Spindle next week, sooner if needed.     ____________________________ Duane Lope Judithann Sheen, MD [id number] D: 08/30/2011 08:01:32 ET T: 08/31/2011 15:19:46 ET JOB#: [id number]  cc: Duane Lope. Judithann Sheen, MD, <Dictator> Generoso Cropper Rodena Medin MD ELECTRONICALLY SIGNED 09/01/2011 21:29

## 2014-07-27 ENCOUNTER — Encounter: Payer: Self-pay | Admitting: *Deleted

## 2014-07-27 ENCOUNTER — Inpatient Hospital Stay
Admission: EM | Admit: 2014-07-27 | Discharge: 2014-08-01 | DRG: 389 | Disposition: A | Discharge status: Home / Self Care | Payer: Medicare Other | Attending: Surgery | Admitting: Surgery

## 2014-07-27 DIAGNOSIS — J449 Chronic obstructive pulmonary disease, unspecified: Secondary | ICD-10-CM | POA: Diagnosis present

## 2014-07-27 DIAGNOSIS — Z79899 Other long term (current) drug therapy: Secondary | ICD-10-CM

## 2014-07-27 DIAGNOSIS — Z87891 Personal history of nicotine dependence: Secondary | ICD-10-CM

## 2014-07-27 DIAGNOSIS — K5660 Unspecified intestinal obstruction: Principal | ICD-10-CM | POA: Diagnosis present

## 2014-07-27 DIAGNOSIS — J45909 Unspecified asthma, uncomplicated: Secondary | ICD-10-CM | POA: Diagnosis present

## 2014-07-27 DIAGNOSIS — I509 Heart failure, unspecified: Secondary | ICD-10-CM | POA: Diagnosis present

## 2014-07-27 DIAGNOSIS — K565 Intestinal adhesions [bands], unspecified as to partial versus complete obstruction: Secondary | ICD-10-CM | POA: Diagnosis present

## 2014-07-27 DIAGNOSIS — Z7951 Long term (current) use of inhaled steroids: Secondary | ICD-10-CM

## 2014-07-27 DIAGNOSIS — Z7982 Long term (current) use of aspirin: Secondary | ICD-10-CM

## 2014-07-27 DIAGNOSIS — Z933 Colostomy status: Secondary | ICD-10-CM

## 2014-07-27 DIAGNOSIS — K56609 Unspecified intestinal obstruction, unspecified as to partial versus complete obstruction: Secondary | ICD-10-CM

## 2014-07-27 DIAGNOSIS — K469 Unspecified abdominal hernia without obstruction or gangrene: Secondary | ICD-10-CM | POA: Diagnosis present

## 2014-07-27 DIAGNOSIS — I4891 Unspecified atrial fibrillation: Secondary | ICD-10-CM | POA: Diagnosis present

## 2014-07-27 DIAGNOSIS — Z8711 Personal history of peptic ulcer disease: Secondary | ICD-10-CM

## 2014-07-27 DIAGNOSIS — I429 Cardiomyopathy, unspecified: Secondary | ICD-10-CM | POA: Diagnosis present

## 2014-07-27 DIAGNOSIS — Z8611 Personal history of tuberculosis: Secondary | ICD-10-CM

## 2014-07-27 DIAGNOSIS — Z87440 Personal history of urinary (tract) infections: Secondary | ICD-10-CM

## 2014-07-27 HISTORY — DX: Ulcerative colitis, unspecified, without complications: K51.90

## 2014-07-27 HISTORY — DX: Heart failure, unspecified: I50.9

## 2014-07-27 HISTORY — DX: Bacteremia: R78.81

## 2014-07-27 HISTORY — DX: Unspecified atrial fibrillation: I48.91

## 2014-07-27 HISTORY — DX: Cardiomyopathy, unspecified: I42.9

## 2014-07-27 HISTORY — DX: Unspecified intestinal obstruction, unspecified as to partial versus complete obstruction: K56.609

## 2014-07-27 HISTORY — DX: Chronic obstructive pulmonary disease, unspecified: J44.9

## 2014-07-27 HISTORY — DX: Unspecified asthma, uncomplicated: J45.909

## 2014-07-27 HISTORY — DX: Respiratory tuberculosis unspecified: A15.9

## 2014-07-27 HISTORY — DX: Gastric ulcer, unspecified as acute or chronic, without hemorrhage or perforation: K25.9

## 2014-07-27 NOTE — ED Notes (Signed)
Pt brought in via ems from home with nausea and vomiting.  Alert.

## 2014-07-27 NOTE — ED Notes (Signed)
Pt brought in via ems from home with nausea and vomitng since 2000 tonight.  Pt has abd pain.  No back pain.  Hx bowel obstruction.  Skin warm and dry

## 2014-07-28 ENCOUNTER — Encounter: Payer: Self-pay | Admitting: Occupational Medicine

## 2014-07-28 ENCOUNTER — Emergency Department: Payer: Medicare Other

## 2014-07-28 DIAGNOSIS — Z933 Colostomy status: Secondary | ICD-10-CM | POA: Diagnosis not present

## 2014-07-28 DIAGNOSIS — Z7951 Long term (current) use of inhaled steroids: Secondary | ICD-10-CM | POA: Diagnosis not present

## 2014-07-28 DIAGNOSIS — K469 Unspecified abdominal hernia without obstruction or gangrene: Secondary | ICD-10-CM | POA: Diagnosis present

## 2014-07-28 DIAGNOSIS — K5660 Unspecified intestinal obstruction: Secondary | ICD-10-CM | POA: Diagnosis not present

## 2014-07-28 DIAGNOSIS — Z7982 Long term (current) use of aspirin: Secondary | ICD-10-CM | POA: Diagnosis not present

## 2014-07-28 DIAGNOSIS — J449 Chronic obstructive pulmonary disease, unspecified: Secondary | ICD-10-CM | POA: Diagnosis present

## 2014-07-28 DIAGNOSIS — K565 Intestinal adhesions [bands], unspecified as to partial versus complete obstruction: Secondary | ICD-10-CM | POA: Diagnosis present

## 2014-07-28 DIAGNOSIS — Z8611 Personal history of tuberculosis: Secondary | ICD-10-CM | POA: Diagnosis not present

## 2014-07-28 DIAGNOSIS — Z87440 Personal history of urinary (tract) infections: Secondary | ICD-10-CM | POA: Diagnosis not present

## 2014-07-28 DIAGNOSIS — Z87891 Personal history of nicotine dependence: Secondary | ICD-10-CM | POA: Diagnosis not present

## 2014-07-28 DIAGNOSIS — Z8711 Personal history of peptic ulcer disease: Secondary | ICD-10-CM | POA: Diagnosis not present

## 2014-07-28 DIAGNOSIS — I509 Heart failure, unspecified: Secondary | ICD-10-CM | POA: Diagnosis present

## 2014-07-28 DIAGNOSIS — I4891 Unspecified atrial fibrillation: Secondary | ICD-10-CM | POA: Diagnosis present

## 2014-07-28 DIAGNOSIS — Z79899 Other long term (current) drug therapy: Secondary | ICD-10-CM | POA: Diagnosis not present

## 2014-07-28 DIAGNOSIS — J45909 Unspecified asthma, uncomplicated: Secondary | ICD-10-CM | POA: Diagnosis present

## 2014-07-28 DIAGNOSIS — I429 Cardiomyopathy, unspecified: Secondary | ICD-10-CM | POA: Diagnosis present

## 2014-07-28 LAB — CBC WITH DIFFERENTIAL/PLATELET
BASOS ABS: 0.1 10*3/uL (ref 0–0.1)
Basophils Relative: 0 %
EOS ABS: 0 10*3/uL (ref 0–0.7)
Eosinophils Relative: 0 %
HEMATOCRIT: 46.4 % (ref 35.0–47.0)
Hemoglobin: 15.2 g/dL (ref 12.0–16.0)
Lymphocytes Relative: 4 %
Lymphs Abs: 1.1 10*3/uL (ref 1.0–3.6)
MCH: 33 pg (ref 26.0–34.0)
MCHC: 32.8 g/dL (ref 32.0–36.0)
MCV: 100.6 fL — AB (ref 80.0–100.0)
MONO ABS: 1.4 10*3/uL — AB (ref 0.2–0.9)
MONOS PCT: 6 %
Neutro Abs: 21.9 10*3/uL — ABNORMAL HIGH (ref 1.4–6.5)
Neutrophils Relative %: 90 %
PLATELETS: 192 10*3/uL (ref 150–440)
RBC: 4.61 MIL/uL (ref 3.80–5.20)
RDW: 14.7 % — AB (ref 11.5–14.5)
WBC: 24.5 10*3/uL — AB (ref 3.6–11.0)

## 2014-07-28 LAB — BASIC METABOLIC PANEL
Anion gap: 14 (ref 5–15)
BUN: 43 mg/dL — ABNORMAL HIGH (ref 6–20)
CALCIUM: 10.8 mg/dL — AB (ref 8.9–10.3)
CHLORIDE: 88 mmol/L — AB (ref 101–111)
CO2: 38 mmol/L — AB (ref 22–32)
Creatinine, Ser: 1.51 mg/dL — ABNORMAL HIGH (ref 0.44–1.00)
GFR calc Af Amer: 35 mL/min — ABNORMAL LOW (ref >60)
GFR calc non Af Amer: 30 mL/min — ABNORMAL LOW (ref >60)
Glucose, Bld: 171 mg/dL — ABNORMAL HIGH (ref 65–99)
Potassium: 4.4 mmol/L (ref 3.5–5.1)
SODIUM: 140 mmol/L (ref 135–145)

## 2014-07-28 MED ORDER — MOMETASONE FURO-FORMOTEROL FUM 100-5 MCG/ACT IN AERO
2.0000 | INHALATION_SPRAY | Freq: Two times a day (BID) | RESPIRATORY_TRACT | Status: DC
  Administered 2014-07-28 – 2014-08-01 (×7): 2 via RESPIRATORY_TRACT
  Filled 2014-07-28 (×2): qty 8.8

## 2014-07-28 MED ORDER — ONDANSETRON HCL 4 MG/2ML IJ SOLN
4.0000 mg | Freq: Once | INTRAMUSCULAR | Status: AC
  Administered 2014-07-28: 4 mg via INTRAVENOUS

## 2014-07-28 MED ORDER — HEPARIN SODIUM (PORCINE) 5000 UNIT/ML IJ SOLN
5000.0000 [IU] | Freq: Three times a day (TID) | INTRAMUSCULAR | Status: DC
  Administered 2014-07-28 – 2014-08-01 (×13): 5000 [IU] via SUBCUTANEOUS
  Filled 2014-07-28 (×12): qty 1

## 2014-07-28 MED ORDER — ONDANSETRON HCL 4 MG/2ML IJ SOLN
4.0000 mg | Freq: Once | INTRAMUSCULAR | Status: AC
Start: 2014-07-28 — End: 2014-07-28

## 2014-07-28 MED ORDER — CELECOXIB 200 MG PO CAPS
200.0000 mg | ORAL_CAPSULE | Freq: Two times a day (BID) | ORAL | Status: DC
  Administered 2014-07-30 – 2014-08-01 (×5): 200 mg via ORAL
  Filled 2014-07-28 (×11): qty 1

## 2014-07-28 MED ORDER — MORPHINE SULFATE 2 MG/ML IJ SOLN
2.0000 mg | INTRAMUSCULAR | Status: DC | PRN
  Administered 2014-07-28 (×2): 2 mg via INTRAVENOUS
  Filled 2014-07-28 (×2): qty 1

## 2014-07-28 MED ORDER — FENTANYL CITRATE (PF) 100 MCG/2ML IJ SOLN
INTRAMUSCULAR | Status: AC
  Administered 2014-07-28: 25 ug via INTRAVENOUS
  Filled 2014-07-28: qty 2

## 2014-07-28 MED ORDER — DIGOXIN 125 MCG PO TABS
0.1250 mg | ORAL_TABLET | Freq: Every day | ORAL | Status: DC
  Administered 2014-07-28 – 2014-08-01 (×5): 0.125 mg via ORAL
  Filled 2014-07-28 (×5): qty 1

## 2014-07-28 MED ORDER — AMITRIPTYLINE HCL 50 MG PO TABS
75.0000 mg | ORAL_TABLET | Freq: Every day | ORAL | Status: DC
  Administered 2014-07-28 – 2014-07-31 (×4): 75 mg via ORAL
  Filled 2014-07-28: qty 1
  Filled 2014-07-28 (×3): qty 2

## 2014-07-28 MED ORDER — HEPARIN SODIUM (PORCINE) 5000 UNIT/ML IJ SOLN
INTRAMUSCULAR | Status: AC
  Administered 2014-07-28: 5000 [IU] via SUBCUTANEOUS
  Filled 2014-07-28: qty 1

## 2014-07-28 MED ORDER — IOHEXOL 240 MG/ML SOLN
50.0000 mL | INTRAMUSCULAR | Status: AC
  Administered 2014-07-28: 50 mL via ORAL

## 2014-07-28 MED ORDER — ALBUTEROL SULFATE (2.5 MG/3ML) 0.083% IN NEBU: 3.0000 mL | INHALATION_SOLUTION | Freq: Two times a day (BID) | RESPIRATORY_TRACT | Status: DC

## 2014-07-28 MED ORDER — ONDANSETRON HCL 4 MG/2ML IJ SOLN
4.0000 mg | Freq: Four times a day (QID) | INTRAMUSCULAR | Status: DC | PRN
  Administered 2014-07-28 – 2014-07-29 (×4): 4 mg via INTRAVENOUS
  Filled 2014-07-28 (×4): qty 2

## 2014-07-28 MED ORDER — ASPIRIN 81 MG PO CHEW
81.0000 mg | CHEWABLE_TABLET | Freq: Every day | ORAL | Status: DC
  Administered 2014-07-28 – 2014-08-01 (×5): 81 mg via ORAL
  Filled 2014-07-28 (×5): qty 1

## 2014-07-28 MED ORDER — SODIUM CHLORIDE 0.9 % IV SOLN
INTRAVENOUS | Status: DC
  Administered 2014-07-28 – 2014-07-31 (×6): via INTRAVENOUS

## 2014-07-28 MED ORDER — ONDANSETRON HCL 4 MG/2ML IJ SOLN
INTRAMUSCULAR | Status: AC
  Administered 2014-07-28: 4 mg via INTRAVENOUS
  Filled 2014-07-28: qty 2

## 2014-07-28 MED ORDER — PANTOPRAZOLE SODIUM 40 MG IV SOLR
40.0000 mg | Freq: Every day | INTRAVENOUS | Status: DC
  Administered 2014-07-28: 40 mg via INTRAVENOUS
  Filled 2014-07-28: qty 40

## 2014-07-28 MED ORDER — LOSARTAN POTASSIUM 25 MG PO TABS
25.0000 mg | ORAL_TABLET | Freq: Every day | ORAL | Status: DC
  Administered 2014-07-28 – 2014-08-01 (×4): 25 mg via ORAL
  Filled 2014-07-28 (×5): qty 1

## 2014-07-28 MED ORDER — SODIUM CHLORIDE 0.9 % IV BOLUS (SEPSIS)
500.0000 mL | Freq: Once | INTRAVENOUS | Status: AC
  Administered 2014-07-28: 500 mL via INTRAVENOUS

## 2014-07-28 MED ORDER — METRONIDAZOLE 500 MG PO TABS
ORAL_TABLET | ORAL | Status: AC
  Administered 2014-07-28: 500 mg via ORAL
  Filled 2014-07-28: qty 1

## 2014-07-28 MED ORDER — FENTANYL CITRATE (PF) 100 MCG/2ML IJ SOLN
25.0000 ug | Freq: Once | INTRAMUSCULAR | Status: AC
  Administered 2014-07-28: 25 ug via INTRAVENOUS

## 2014-07-28 MED ORDER — SODIUM CHLORIDE 0.9 % IV BOLUS (SEPSIS)
1000.0000 mL | Freq: Once | INTRAVENOUS | Status: AC
  Administered 2014-07-28: 1000 mL via INTRAVENOUS

## 2014-07-28 MED ORDER — TIOTROPIUM BROMIDE MONOHYDRATE 18 MCG IN CAPS
18.0000 ug | ORAL_CAPSULE | Freq: Every day | RESPIRATORY_TRACT | Status: DC
  Administered 2014-07-28 – 2014-08-01 (×5): 18 ug via RESPIRATORY_TRACT
  Filled 2014-07-28 (×2): qty 5

## 2014-07-28 MED ORDER — ONDANSETRON HCL 4 MG/2ML IJ SOLN
INTRAMUSCULAR | Status: AC
  Administered 2014-07-28: 4 mg
  Filled 2014-07-28: qty 2

## 2014-07-28 MED ORDER — METRONIDAZOLE 500 MG PO TABS
500.0000 mg | ORAL_TABLET | Freq: Once | ORAL | Status: AC
  Administered 2014-07-28: 500 mg via ORAL

## 2014-07-28 MED ORDER — ALBUTEROL SULFATE (2.5 MG/3ML) 0.083% IN NEBU: 3.0000 mL | INHALATION_SOLUTION | Freq: Four times a day (QID) | RESPIRATORY_TRACT | Status: DC | PRN

## 2014-07-28 NOTE — Progress Notes (Signed)
MD notified of pt bladder scan <57ml and no urine output throughout shift. New orders received see MAR

## 2014-07-28 NOTE — Progress Notes (Signed)
Subjective: She was readmitted again last evening with another bowel obstruction. It had been approximately 2 years since her last admission. All of her previous obstructions have resolved without nasogastric suction. Currently she continues to have some moderate abdominal discomfort and persistent nausea. She has not vomited. CT scan did demonstrate what appeared to be a distal small bowel obstruction not related to her known abdominal hernia.  Vital signs in last 24 hours: Temp:  [97.4 F (36.3 C)-98 F (36.7 C)] 97.4 F (36.3 C) (06/02 1555) Pulse Rate:  [40-105] 91 (06/02 1555) Resp:  [13-31] 18 (06/02 0818) BP: (92-130)/(57-96) 105/71 mmHg (06/02 1555) SpO2:  [78 %-100 %] 93 % (06/02 1555) Weight:  [74.844 kg (165 lb)] 74.844 kg (165 lb) (06/01 2311)    Intake/Output from previous day:    Exam:  Her abdomen is mildly distended nontender with active bowel sounds. Her hernia is reducible and her abdomen is otherwise quite soft. She has active bowel sounds.  Lab Results:  CBC  Recent Labs  07/27/14 2357  WBC 24.5*  HGB 15.2  HCT 46.4  PLT 192   CMP     Component Value Date/Time   NA 140 07/27/2014 2357   NA 134* 05/22/2012 0412   K 4.4 07/27/2014 2357   K 4.5 05/22/2012 0412   CL 88* 07/27/2014 2357   CL 102 05/22/2012 0412   CO2 38* 07/27/2014 2357   CO2 26 05/22/2012 0412   GLUCOSE 171* 07/27/2014 2357   GLUCOSE 141* 05/22/2012 0412   BUN 43* 07/27/2014 2357   BUN 60* 05/22/2012 0412   CREATININE 1.51* 07/27/2014 2357   CREATININE 1.26 05/22/2012 0412   CALCIUM 10.8* 07/27/2014 2357   CALCIUM 8.6 05/22/2012 0412   PROT 7.3 05/22/2012 0412   ALBUMIN 3.1* 05/22/2012 0412   AST 37 05/22/2012 0412   ALT 24 05/22/2012 0412   ALKPHOS 74 05/22/2012 0412   GFRNONAA 30* 07/27/2014 2357   GFRNONAA 40* 05/22/2012 0412   GFRAA 35* 07/27/2014 2357   GFRAA 46* 05/22/2012 0412   PT/INR No results for input(s): LABPROT, INR in the last 72  hours.  Studies/Results: Ct Abdomen Pelvis Wo Contrast  07/28/2014   CLINICAL DATA:  Left lower quadrant pain and emesis. Elevated white blood cell count. Nausea. Symptoms for 6 hours. History of appendectomy, cholecystectomy, hysterectomy, diverticulitis with perforation resulting in colostomy then colostomy reversal, ventral hernia repair, and rectovaginal fistula.  EXAM: CT ABDOMEN AND PELVIS WITHOUT CONTRAST  TECHNIQUE: Multidetector CT imaging of the abdomen and pelvis was performed following the standard protocol without IV contrast.  COMPARISON:  CT 05/22/2012  FINDINGS: Emphysema at the lung bases. Partially subpleural nodule in the left lower lobe, unchanged from prior exam.  Marked gastric distension with ingested contrast. Distended small bowel loops, proximally with contrast, distally with fluid. There is mild feculization of small bowel contents in the left lower abdomen. The distal small bowel loops are decompressed, suspect transition in the left lower quadrant. There is a wide necked ventral abdominal wall hernia containing decompressed loops of bowel, both afferent and efferent bowel loops are decompressed, this is not the site of obstruction. Moderate stool throughout the colon without colonic wall thickening. There is distal colonic diverticulosis without acute diverticulitis. Postsurgical change noted in the sigmoid colon. There is no pneumatosis, free air, free fluid or intra-abdominal fluid collection. No findings of perforation.  Postcholecystectomy without disproportionate biliary dilatation. The unenhanced liver is unremarkable. Spleen is small in size without localizing abnormality. The pancreas  is atrophic. The adrenal glands are normal. There is a simple cyst in the upper right kidney that is unchanged from prior exam. No hydronephrosis or renal stone.  No retroperitoneal adenopathy. Abdominal aorta is normal in caliber. Dense atherosclerosis of the abdominal aorta and its branches.   Pelvic anatomy is suboptimally defined. Urinary bladder appears decompressed but is not well demonstrated. The uterus is not discretely identified. Pelvic bowel loops are suboptimally defined. Superior to the bowel containing ventral hernia are upper abdominal ventral hernias containing omental fat.  Extensive degenerative change throughout the lumbar spine with multilevel vacuum phenomenon and advanced facet disease. There are no acute or suspicious osseous abnormalities.  IMPRESSION: 1. Findings consistent with small-bowel obstruction, suspect site of transition in the left lower quadrant. No perforation or free air. 2. Multiple ventral abdominal hernias, lower hernia containing normal appearing loops of decompressed small bowel. This is not the sites of obstruction. Upper abdominal ventral hernias contain only mesenteric fat.   Electronically Signed   By: Rubye Oaks M.D.   On: 07/28/2014 03:46    Assessment/Plan: She does not want to consider a nasogastric tube although I feel like that is probably the best option for her. She would be a very poor surgical candidate were we to choose operative intervention. We will continue to follow her over the next 12 hours repeat her plain films tomorrow and if she does not improve we'll strongly suggest she consider nasogastric decompression.

## 2014-07-28 NOTE — ED Provider Notes (Signed)
[facility] Emergency Department Provider Note  ____________________________________________  Time seen: 12:30 AM  I have reviewed the triage vital signs and the nursing notes.   HISTORY  Chief Complaint Nausea and Emesis      HPI [name] is a 81 y.o. female presents with bilateral lower quadrant 10 out of 10 pain and nonbloody vomiting with onset at 10 PM last night.Patient denies any aggravating or alleviating factors. Admits to 2 bowel movements today. Of note patient has a history of abdominal surgeries and multiple previous bowel obstructions.     History reviewed. No pertinent past medical history.  There are no active problems to display for this patient.   History reviewed. No pertinent past surgical history.  No current outpatient prescriptions on file.  Allergies Review of patient's allergies indicates no known allergies.  No family history on file.  Social History History  Substance Use Topics  . Smoking status: Former Games developer  . Smokeless tobacco: Not on file  . Alcohol Use: No    Review of Systems  Constitutional: Negative for fever. Eyes: Negative for visual changes. ENT: Negative for sore throat. Cardiovascular: Negative for chest pain. Respiratory: Negative for shortness of breath. Gastrointestinal: Positive for abdominal pain, positive vomiting Genitourinary: Negative for dysuria. Musculoskeletal: Negative for back pain. Skin: Negative for rash. Neurological: Negative for headaches, focal weakness or numbness.   10-point ROS otherwise negative.  ____________________________________________   PHYSICAL EXAM:  VITAL SIGNS: ED Triage Vitals  Enc Vitals Group     BP 07/27/14 2311 118/87 mmHg     Pulse Rate 07/27/14 2311 45     Resp 07/28/14 0030 17     Temp 07/27/14 2311 98 F (36.7 C)     Temp Source 07/27/14 2311 Oral     SpO2 07/27/14 2311 99 %     Weight 07/27/14 2311 165 lb (74.844 kg)   Height 07/27/14 2311  (1.6 m)     Head Cir --      Peak Flow --      Pain Score 07/27/14 2313 5     Pain Loc --      Pain Edu? --      Excl. in GC? --      Constitutional: Alert and oriented. Apparent moderate distress. Eyes: Conjunctivae are normal. PERRL. Normal extraocular movements. ENT   Head: Normocephalic and atraumatic.   Nose: No congestion/rhinnorhea.   Mouth/Throat: Mucous membranes are moist.   Neck: No stridor.. Cardiovascular: Normal rate, regular rhythm. Normal and symmetric distal pulses are present in all extremities. No murmurs, rubs, or gallops. Respiratory: Normal respiratory effort without tachypnea nor retractions. Breath sounds are clear and equal bilaterally. No wheezes/rales/rhonchi. Gastrointestinal: Positive right lower quadrant/left lower quadrant pain with palpation. No distention. There is no CVA tenderness. Genitourinary: deferred Musculoskeletal: Nontender with normal range of motion in all extremities. No joint effusions.  No lower extremity tenderness nor edema. Neurologic:  Normal speech and language. No gross focal neurologic deficits are appreciated. Speech is normal.  Skin:  Skin is warm, dry and intact. No rash noted. Psychiatric: Mood and affect are normal. Speech and behavior are normal. Patient exhibits appropriate insight and judgment.  ____________________________________________    LABS (pertinent positives/negatives)  Labs Reviewed  CBC WITH DIFFERENTIAL/PLATELET - Abnormal; Notable for the following:    WBC 24.5 (*)    MCV 100.6 (*)    RDW 14.7 (*)    Neutro Abs 21.9 (*)    Monocytes Absolute 1.4 (*)  All other components within normal limits  BASIC METABOLIC PANEL - Abnormal; Notable for the following:    Chloride 88 (*)    CO2 38 (*)    Glucose, Bld 171 (*)    BUN 43 (*)    Creatinine, Ser 1.51 (*)    Calcium 10.8 (*)    GFR calc non Af Amer 30 (*)    GFR calc Af Amer 35 (*)    All other components  within normal limits     ____________________________________________     RADIOLOGY  CT scan of the abdomen and pelvis reveals small bowel obstruction  _   INITIAL IMPRESSION / ASSESSMENT AND PLAN / ED COURSE  Pertinent labs & imaging results that were available during my care of the patient were reviewed by me and considered in my medical decision making (see chart for details).  History physical exam and CT consistent with small bowel obstruction patient discussed with Dr. Juliann Pulse for admission. Patient received IV Zofran and morphine for  antiemetic and analgesic  ____________________________________________   FINAL CLINICAL IMPRESSION(S) / ED DIAGNOSES  Final diagnoses:  Small bowel obstruction      Darci Current, MD 07/28/14 [phone]

## 2014-07-28 NOTE — H&P (Signed)
CC: Nausea/vomiting x 1 day  HPI: [name] is a pleasant 81 yo F with history of COPD and cardiomyopathy who presents with 1 day of mild abdominal pain, nausea/vomiting.  Has had extensive surgical history including colectomy with colostomy, ventral hernia s/p repair with recurrence and has been admitted multiple times by our service in 2012 and 2013 for presumed small bowel obstruction vs constipation.  Most recently was seen in ER in 2014 and thought to have bacterial overgrowth in small intestine and was began on daily flagyl with no episodes of SBO since.  Approx 8 hours ago began projectile vomiting.  No vomiting while in ER but has received nausea medicine 2x. BM x 2 today but unsure if passing flatus.  Mild abdominal pain but has chronic intermittent abdominal pain at baseline.  Also c/o some leg cramping since this all began.  No fevers/chills, headache, chest pain, shortness of breath, cough, dysuria/hematuria.  Active Ambulatory Problems    Diagnosis Date Noted  . No Active Ambulatory Problems   Resolved Ambulatory Problems    Diagnosis Date Noted  . No Resolved Ambulatory Problems   Past Medical History  Diagnosis Date  . Asthma   . COPD (chronic obstructive pulmonary disease)   . CHF (congestive heart failure)   . Cardiomyopathy 1990  . Bacteremia 2007  . Atrial fibrillation   . Bowel obstruction   . TB (tuberculosis)   . Multiple gastric ulcers   . Ulcerative colitis    Allergies: All ACE-I, metoprolol  Home meds:   Medication List    ASK your doctor about these medications        albuterol 108 (90 BASE) MCG/ACT inhaler  Commonly known as:  PROVENTIL HFA;VENTOLIN HFA  Inhale 2 puffs into the lungs 2 (two) times daily.     amitriptyline 75 MG tablet  Commonly known as:  ELAVIL  Take 75 mg by mouth at bedtime.     aspirin 81 MG chewable tablet  Chew 81 mg by mouth daily.     celecoxib 200 MG capsule  Commonly known as:  CELEBREX  Take 200 mg by mouth 2 (two)  times daily.     digoxin 0.125 MG tablet  Commonly known as:  LANOXIN  Take 0.125 mg by mouth daily.     Fluticasone-Salmeterol 100-50 MCG/DOSE Aepb  Commonly known as:  ADVAIR  Inhale 1 puff into the lungs 2 (two) times daily.     furosemide 20 MG tablet  Commonly known as:  LASIX  Take 60 mg by mouth daily.     losartan 25 MG tablet  Commonly known as:  COZAAR  Take 25 mg by mouth daily.     metroNIDAZOLE 500 MG tablet  Commonly known as:  FLAGYL  Take 500 mg by mouth once.     omeprazole 20 MG capsule  Commonly known as:  PRILOSEC  Take 20 mg by mouth 2 (two) times daily before a meal.     tiotropium 18 MCG inhalation capsule  Commonly known as:  SPIRIVA  Place 18 mcg into inhaler and inhale daily.        History   Social History  . Marital Status: Married    Spouse Name: N/A  . Number of Children: N/A  . Years of Education: N/A   Occupational History  . Not on file.   Social History Main Topics  . Smoking status: Former Games developer  . Smokeless tobacco: Not on file  . Alcohol Use: No  .  Drug Use: No  . Sexual Activity: No   Other Topics Concern  . Not on file   Social History Narrative    History reviewed. No pertinent family history. ROS: Full review of systems obtained, pertinent positives and negatives as above.    Blood pressure 116/63, pulse 94, temperature 98 F (36.7 C), temperature source Oral, resp. rate 22, height 5\' 3"  (1.6 m), weight 74.844 kg (165 lb), SpO2 92 %.  GEN: NAD/A&Ox3 HEAD: normocephalic, atraumatic EYES: No scleral icterus, no conjunctivitis FACE: no obvious facial trauma, normal external ears, normal external nose CV: RRR, no MRG RESP: moving air well, lungs clear to auscultation ABD: soft, min tender, large reducible abdominal hernia EXT: moving all ext well, strength 5/5 NEURO: CN II - XII grossly intact, sensation intact all 4 ext  Labs: All labs personally reviewed, pertinent labs summarized below  WBC 24.5 90%  neutrophils Na 140, Cl 88, Bicarb 38, BUN 43, Cr 1.51 Urinalysis pending  CT: personally reviewed Distended stomach with contrast, proximal distended bowel loops with contrast with some fecalization of sb loops, distal sb loops decompressed, moderate stool in colon Large ventral hernia with bowel loops, decompressed  A/P 81 yo with history of recurrent SBO likely secondary to multiple surgeries but examination of prior hospitalizations indicates that constipation may have been a component of her "bowel obstructions" in past.  Is on chronic flagyl for bacterial overgrowth in the context of chronic sbo.  Has not required NG tube in past.  Will admit for hydration and observation.  If does not improve will discuss NG tube as patient is very poor operative candidate.  F/u on urinalysis as has history of recurrent UTI and this would explain leukocytosis as abdominal exam and CT findings do not.

## 2014-07-28 NOTE — ED Notes (Signed)
md in with pt.  meds given for nausea.

## 2014-07-29 ENCOUNTER — Inpatient Hospital Stay: Payer: Medicare Other

## 2014-07-29 DIAGNOSIS — K5660 Unspecified intestinal obstruction: Secondary | ICD-10-CM

## 2014-07-29 LAB — CBC WITH DIFFERENTIAL/PLATELET
BASOS ABS: 0.1 10*3/uL (ref 0–0.1)
Basophils Relative: 1 %
EOS ABS: 0.1 10*3/uL (ref 0–0.7)
HCT: 35.1 % (ref 35.0–47.0)
Hemoglobin: 11.6 g/dL — ABNORMAL LOW (ref 12.0–16.0)
Lymphocytes Relative: 12 %
Lymphs Abs: 1.1 10*3/uL (ref 1.0–3.6)
MCH: 33.5 pg (ref 26.0–34.0)
MCHC: 33 g/dL (ref 32.0–36.0)
MCV: 101.2 fL — ABNORMAL HIGH (ref 80.0–100.0)
MONO ABS: 0.8 10*3/uL (ref 0.2–0.9)
Monocytes Relative: 9 %
Neutro Abs: 7.1 10*3/uL — ABNORMAL HIGH (ref 1.4–6.5)
PLATELETS: 133 10*3/uL — AB (ref 150–440)
RBC: 3.46 MIL/uL — ABNORMAL LOW (ref 3.80–5.20)
RDW: 14.9 % — AB (ref 11.5–14.5)
WBC: 9.2 10*3/uL (ref 3.6–11.0)

## 2014-07-29 LAB — URINALYSIS COMPLETE WITH MICROSCOPIC (ARMC ONLY)
Bacteria, UA: NONE SEEN
Bilirubin Urine: NEGATIVE
Glucose, UA: NEGATIVE mg/dL
Hgb urine dipstick: NEGATIVE
Ketones, ur: NEGATIVE mg/dL
Leukocytes, UA: NEGATIVE
Nitrite: NEGATIVE
Protein, ur: NEGATIVE mg/dL
Specific Gravity, Urine: 1.018 (ref 1.005–1.030)
pH: 7 (ref 5.0–8.0)

## 2014-07-29 LAB — URINE CULTURE

## 2014-07-29 LAB — BASIC METABOLIC PANEL
Anion gap: 7 (ref 5–15)
BUN: 37 mg/dL — ABNORMAL HIGH (ref 6–20)
CALCIUM: 8.2 mg/dL — AB (ref 8.9–10.3)
CO2: 34 mmol/L — AB (ref 22–32)
CREATININE: 1.03 mg/dL — AB (ref 0.44–1.00)
Chloride: 102 mmol/L (ref 101–111)
GFR calc Af Amer: 56 mL/min — ABNORMAL LOW (ref >60)
GFR, EST NON AFRICAN AMERICAN: 48 mL/min — AB (ref >60)
GLUCOSE: 131 mg/dL — AB (ref 65–99)
POTASSIUM: 3.8 mmol/L (ref 3.5–5.1)
Sodium: 143 mmol/L (ref 135–145)

## 2014-07-29 LAB — MAGNESIUM: MAGNESIUM: 2.3 mg/dL (ref 1.7–2.4)

## 2014-07-29 LAB — PHOSPHORUS: Phosphorus: 3 mg/dL (ref 2.5–4.6)

## 2014-07-29 MED ORDER — PANTOPRAZOLE SODIUM 20 MG PO TBEC
20.0000 mg | DELAYED_RELEASE_TABLET | Freq: Two times a day (BID) | ORAL | Status: DC
Start: 2014-07-29 — End: 2014-07-29
  Filled 2014-07-29 (×2): qty 1

## 2014-07-29 MED ORDER — PANTOPRAZOLE SODIUM 40 MG PO TBEC
40.0000 mg | DELAYED_RELEASE_TABLET | Freq: Two times a day (BID) | ORAL | Status: DC
  Administered 2014-07-29 – 2014-08-01 (×6): 40 mg via ORAL
  Filled 2014-07-29 (×6): qty 1

## 2014-07-29 MED ORDER — SODIUM CHLORIDE 0.9 % IV BOLUS (SEPSIS)
250.0000 mL | Freq: Once | INTRAVENOUS | Status: AC
  Administered 2014-07-29: 250 mL via INTRAVENOUS

## 2014-07-29 NOTE — Progress Notes (Signed)
Spoke with Dr. Juliann Pulse again about pts low output - has still not had any this shift - gave results of bladder scan ( ) - gave order for bolus once - continue to monitor and will call with another update later.

## 2014-07-29 NOTE — Progress Notes (Signed)
Subjective:   She feels poorly this morning but is not having any more obstructive symptoms. Her pain is improved. She does not have any significant fever. She is mildly nauseated. She has not vomited. Her urine output has improved. Her laboratory values are better with a white count down to 9000 and her creatinine back to 1.  Vital signs in last 24 hours: Temp:  [97.4 F (36.3 C)-98.6 F (37 C)] 97.5 F (36.4 C) (06/03 0907) Pulse Rate:  [73-98] 73 (06/03 0907) Resp:  [17-22] 18 (06/03 0452) BP: (105-142)/(47-71) 118/47 mmHg (06/03 0907) SpO2:  [91 %-100 %] 93 % (06/03 0907) Last BM Date: 07/27/14  Intake/Output from previous day: 06/02 0701 - 06/03 0700 In: 3544.3 [I.V.:3544.3] Out: 300 [Urine:300]  Exam:  Her abdomen is moderately distended moderately tender with no new findings. She has active bowel sounds. There is no rebound or guarding. She does not have any change in her hernias.  Lab Results:  CBC  Recent Labs  07/27/14 2357 07/29/14 0312  WBC 24.5* 9.2  HGB 15.2 11.6*  HCT 46.4 35.1  PLT 192 133*   CMP     Component Value Date/Time   NA 143 07/29/2014 0312   NA 134* 05/22/2012 0412   K 3.8 07/29/2014 0312   K 4.5 05/22/2012 0412   CL 102 07/29/2014 0312   CL 102 05/22/2012 0412   CO2 34* 07/29/2014 0312   CO2 26 05/22/2012 0412   GLUCOSE 131* 07/29/2014 0312   GLUCOSE 141* 05/22/2012 0412   BUN 37* 07/29/2014 0312   BUN 60* 05/22/2012 0412   CREATININE 1.03* 07/29/2014 0312   CREATININE 1.26 05/22/2012 0412   CALCIUM 8.2* 07/29/2014 0312   CALCIUM 8.6 05/22/2012 0412   PROT 7.3 05/22/2012 0412   ALBUMIN 3.1* 05/22/2012 0412   AST 37 05/22/2012 0412   ALT 24 05/22/2012 0412   ALKPHOS 74 05/22/2012 0412   GFRNONAA 48* 07/29/2014 0312   GFRNONAA 40* 05/22/2012 0412   GFRAA 56* 07/29/2014 0312   GFRAA 46* 05/22/2012 0412   PT/INR No results for input(s): LABPROT, INR in the last 72 hours.  Studies/Results: Ct Abdomen Pelvis Wo  Contrast  07/28/2014   CLINICAL DATA:  Left lower quadrant pain and emesis. Elevated white blood cell count. Nausea. Symptoms for 6 hours. History of appendectomy, cholecystectomy, hysterectomy, diverticulitis with perforation resulting in colostomy then colostomy reversal, ventral hernia repair, and rectovaginal fistula.  EXAM: CT ABDOMEN AND PELVIS WITHOUT CONTRAST  TECHNIQUE: Multidetector CT imaging of the abdomen and pelvis was performed following the standard protocol without IV contrast.  COMPARISON:  CT 05/22/2012  FINDINGS: Emphysema at the lung bases. Partially subpleural nodule in the left lower lobe, unchanged from prior exam.  Marked gastric distension with ingested contrast. Distended small bowel loops, proximally with contrast, distally with fluid. There is mild feculization of small bowel contents in the left lower abdomen. The distal small bowel loops are decompressed, suspect transition in the left lower quadrant. There is a wide necked ventral abdominal wall hernia containing decompressed loops of bowel, both afferent and efferent bowel loops are decompressed, this is not the site of obstruction. Moderate stool throughout the colon without colonic wall thickening. There is distal colonic diverticulosis without acute diverticulitis. Postsurgical change noted in the sigmoid colon. There is no pneumatosis, free air, free fluid or intra-abdominal fluid collection. No findings of perforation.  Postcholecystectomy without disproportionate biliary dilatation. The unenhanced liver is unremarkable. Spleen is small in size without localizing abnormality. The  pancreas is atrophic. The adrenal glands are normal. There is a simple cyst in the upper right kidney that is unchanged from prior exam. No hydronephrosis or renal stone.  No retroperitoneal adenopathy. Abdominal aorta is normal in caliber. Dense atherosclerosis of the abdominal aorta and its branches.  Pelvic anatomy is suboptimally defined. Urinary  bladder appears decompressed but is not well demonstrated. The uterus is not discretely identified. Pelvic bowel loops are suboptimally defined. Superior to the bowel containing ventral hernia are upper abdominal ventral hernias containing omental fat.  Extensive degenerative change throughout the lumbar spine with multilevel vacuum phenomenon and advanced facet disease. There are no acute or suspicious osseous abnormalities.  IMPRESSION: 1. Findings consistent with small-bowel obstruction, suspect site of transition in the left lower quadrant. No perforation or free air. 2. Multiple ventral abdominal hernias, lower hernia containing normal appearing loops of decompressed small bowel. This is not the sites of obstruction. Upper abdominal ventral hernias contain only mesenteric fat.   Electronically Signed   By: Rubye Oaks M.D.   On: 07/28/2014 03:46   Dg Abd 2 Views  07/29/2014   CLINICAL DATA:  Small bowel obstruction  EXAM: ABDOMEN - 2 VIEW  COMPARISON:  CT abdomen and pelvis [place] 2, 2016  FINDINGS: Supine and upright images obtained. Contrast reaches the colon. The stomach is somewhat distended with air. Most of the small bowel loops are fluid filled. There is mild dilatation of the stomach but currently no small or large bowel dilatation. No free air. No appreciable air-fluid levels.  IMPRESSION: Most of the small bowel loops are fluid filled. There may be a degree of residual obstruction. The appearance is more suspicious for enteritis or ileus, however. No free air. Stomach mildly distended with air.   Electronically Signed   By: Bretta Bang III M.D.   On: 07/29/2014 07:36    Assessment/Plan: She wants to start eating. I discussed situation with her sons in detail. We will put her on some liquids and see how she does. I'm concerned that this episode is worse than her previous episodes I'm not surgically recover without more aggressive intervention. We talked to her sons about the possibility  considering surgery and they are going to discuss those options between themselves. I will check on her later this afternoon.

## 2014-07-29 NOTE — Progress Notes (Signed)
Notified Dr. Juliann Pulse - pt voided - acknowledged.

## 2014-07-29 NOTE — Progress Notes (Signed)
Spoke with Dr. Juliann Pulse about progress after bolus - pt stated she thought she had the urge to urinate but once up to bedpan lost the urge - MD gave orders to have AM labs drawn early and RN will call back once results are in.

## 2014-07-30 DIAGNOSIS — K5660 Unspecified intestinal obstruction: Secondary | ICD-10-CM

## 2014-07-30 LAB — CBC
HEMATOCRIT: 33.5 % — AB (ref 35.0–47.0)
Hemoglobin: 11.1 g/dL — ABNORMAL LOW (ref 12.0–16.0)
MCH: 34.1 pg — ABNORMAL HIGH (ref 26.0–34.0)
MCHC: 33.1 g/dL (ref 32.0–36.0)
MCV: 103.1 fL — AB (ref 80.0–100.0)
Platelets: 114 10*3/uL — ABNORMAL LOW (ref 150–440)
RBC: 3.25 MIL/uL — ABNORMAL LOW (ref 3.80–5.20)
RDW: 15 % — ABNORMAL HIGH (ref 11.5–14.5)
WBC: 6.1 10*3/uL (ref 3.6–11.0)

## 2014-07-30 LAB — BASIC METABOLIC PANEL
Anion gap: 3 — ABNORMAL LOW (ref 5–15)
BUN: 19 mg/dL (ref 6–20)
CO2: 27 mmol/L (ref 22–32)
CREATININE: 0.7 mg/dL (ref 0.44–1.00)
Calcium: 7.3 mg/dL — ABNORMAL LOW (ref 8.9–10.3)
Chloride: 109 mmol/L (ref 101–111)
GFR calc Af Amer: >60 mL/min (ref >60)
GFR calc non Af Amer: >60 mL/min (ref >60)
Glucose, Bld: 98 mg/dL (ref 65–99)
Potassium: 3.9 mmol/L (ref 3.5–5.1)
Sodium: 139 mmol/L (ref 135–145)

## 2014-07-30 MED ORDER — BISACODYL 10 MG RE SUPP
10.0000 mg | Freq: Once | RECTAL | Status: AC
  Administered 2014-07-30: 10 mg via RECTAL
  Filled 2014-07-30: qty 1

## 2014-07-30 MED ORDER — ALUM & MAG HYDROXIDE-SIMETH 200-200-20 MG/5ML PO SUSP
30.0000 mL | Freq: Four times a day (QID) | ORAL | Status: DC | PRN
  Administered 2014-07-30 – 2014-07-31 (×3): 30 mL via ORAL
  Filled 2014-07-30 (×3): qty 30

## 2014-07-30 NOTE — Progress Notes (Signed)
Subjective:   She is feeling better this morning. She feels brighter and more alert mentally. She denies any abdominal pain. She is not nauseated and has tolerated clear liquid diet. She has been up to a chair. She's having a little pulmonary congestion. She's not had a bowel movement yet but is passing gas. Plain films yesterday were improved.  Vital signs in last 24 hours: Temp:  [97.5 F (36.4 C)-98.3 F (36.8 C)] 98.2 F (36.8 C) (06/04 0823) Pulse Rate:  [64-93] 64 (06/04 0823) Resp:  [18-20] 20 (06/04 0823) BP: (79-118)/(47-62) 104/62 mmHg (06/04 0823) SpO2:  [86 %-97 %] 97 % (06/04 0823) Last BM Date: 07/27/14  Intake/Output from previous day: 06/03 0701 - 06/04 0700 In: 4126.9 [P.O.:1080; I.V.:3046.9] Out: 700 [Urine:700]  Exam:  Her abdomen is soft the hernia is still palpable but is not significantly tender. She has active bowel sounds. She does not have any adventitious sounds on pulmonary exam.  Lab Results:  CBC  Recent Labs  07/29/14 0312 07/30/14 0650  WBC 9.2 6.1  HGB 11.6* 11.1*  HCT 35.1 33.5*  PLT 133* 114*   CMP     Component Value Date/Time   NA 139 07/30/2014 0650   NA 134* 05/22/2012 0412   K 3.9 07/30/2014 0650   K 4.5 05/22/2012 0412   CL 109 07/30/2014 0650   CL 102 05/22/2012 0412   CO2 27 07/30/2014 0650   CO2 26 05/22/2012 0412   GLUCOSE 98 07/30/2014 0650   GLUCOSE 141* 05/22/2012 0412   BUN 19 07/30/2014 0650   BUN 60* 05/22/2012 0412   CREATININE 0.70 07/30/2014 0650   CREATININE 1.26 05/22/2012 0412   CALCIUM 7.3* 07/30/2014 0650   CALCIUM 8.6 05/22/2012 0412   PROT 7.3 05/22/2012 0412   ALBUMIN 3.1* 05/22/2012 0412   AST 37 05/22/2012 0412   ALT 24 05/22/2012 0412   ALKPHOS 74 05/22/2012 0412   GFRNONAA >60 07/30/2014 0650   GFRNONAA 40* 05/22/2012 0412   GFRAA >60 07/30/2014 0650   GFRAA 46* 05/22/2012 0412   PT/INR No results for input(s): LABPROT, INR in the last 72 hours.  Studies/Results: Dg Abd 2  Views  07/29/2014   CLINICAL DATA:  Small bowel obstruction  EXAM: ABDOMEN - 2 VIEW  COMPARISON:  CT abdomen and pelvis [place] 2, 2016  FINDINGS: Supine and upright images obtained. Contrast reaches the colon. The stomach is somewhat distended with air. Most of the small bowel loops are fluid filled. There is mild dilatation of the stomach but currently no small or large bowel dilatation. No free air. No appreciable air-fluid levels.  IMPRESSION: Most of the small bowel loops are fluid filled. There may be a degree of residual obstruction. The appearance is more suspicious for enteritis or ileus, however. No free air. Stomach mildly distended with air.   Electronically Signed   By: Bretta Bang III M.D.   On: 07/29/2014 07:36    Assessment/Plan: Overall she is improving. We will advance her diet and give her some Dulcolax suppository to stimulate some bowel function see how she does. We would anticipate discharge in the next 24 hours.

## 2014-07-31 DIAGNOSIS — K5660 Unspecified intestinal obstruction: Secondary | ICD-10-CM

## 2014-07-31 NOTE — Progress Notes (Signed)
Subjective:   She feels better this morning. She has eaten some breakfast and has not had any further nausea. She has not vomited. She still has some mild indigestion symptoms. She relates that her abdomen is much less tender and she is not have the bloating sensation she had earlier. She had 2 bowel movements yesterday. She is breathing comfortably.  Vital signs in last 24 hours: Temp:  [97.7 F (36.5 C)-98.4 F (36.9 C)] 97.7 F (36.5 C) (06/05 0820) Pulse Rate:  [78-85] 78 (06/05 0820) Resp:  [17-18] 17 (06/05 0820) BP: (107-118)/(57-62) 108/61 mmHg (06/05 0820) SpO2:  [91 %-96 %] 96 % (06/05 0820) Last BM Date: 07/30/14  Intake/Output from previous day: 06/04 0701 - 06/05 0700 In: 2302.5 [P.O.:480; I.V.:1822.5] Out: 1200 [Urine:1200]  Exam:  Her abdomen is soft and nontender with no abdominal distention and active bowel sounds. She does not have any tenderness in her midline hernias.  Lab Results:  CBC  Recent Labs  07/29/14 0312 07/30/14 0650  WBC 9.2 6.1  HGB 11.6* 11.1*  HCT 35.1 33.5*  PLT 133* 114*   CMP     Component Value Date/Time   NA 139 07/30/2014 0650   NA 134* 05/22/2012 0412   K 3.9 07/30/2014 0650   K 4.5 05/22/2012 0412   CL 109 07/30/2014 0650   CL 102 05/22/2012 0412   CO2 27 07/30/2014 0650   CO2 26 05/22/2012 0412   GLUCOSE 98 07/30/2014 0650   GLUCOSE 141* 05/22/2012 0412   BUN 19 07/30/2014 0650   BUN 60* 05/22/2012 0412   CREATININE 0.70 07/30/2014 0650   CREATININE 1.26 05/22/2012 0412   CALCIUM 7.3* 07/30/2014 0650   CALCIUM 8.6 05/22/2012 0412   PROT 7.3 05/22/2012 0412   ALBUMIN 3.1* 05/22/2012 0412   AST 37 05/22/2012 0412   ALT 24 05/22/2012 0412   ALKPHOS 74 05/22/2012 0412   GFRNONAA >60 07/30/2014 0650   GFRNONAA 40* 05/22/2012 0412   GFRAA >60 07/30/2014 0650   GFRAA 46* 05/22/2012 0412   PT/INR No results for input(s): LABPROT, INR in the last 72 hours.  Studies/Results: No results  found.  Assessment/Plan: Overall seems be doing pretty well. We will increase her activity today and tentatively plan for discharge tomorrow.

## 2014-08-01 NOTE — Progress Notes (Signed)
Subjective:   Patient feels markedly improved. She wishes to go home today. She has been having bowel movements and passing lots of flatus. She is tolerating a soft diet.  Vital signs in last 24 hours: Temp:  [97.9 F (36.6 C)-98.5 F (36.9 C)] 97.9 F (36.6 C) (06/06 0752) Pulse Rate:  [78-88] 88 (06/06 0752) Resp:  [18-20] 20 (06/05 2323) BP: (111-153)/(74-96) 122/81 mmHg (06/06 0752) SpO2:  [93 %-99 %] 93 % (06/06 0752) Last BM Date: 07/30/14  Intake/Output from previous day: 06/05 0701 - 06/06 0700 In: 1096.1 [P.O.:240; I.V.:856.1] Out: 1950 [Urine:1950]  Exam:  Abdomen is soft, flat, nontender, nondistended.  Lab Results:  CBC  Recent Labs  07/30/14 0650  WBC 6.1  HGB 11.1*  HCT 33.5*  PLT 114*   CMP     Component Value Date/Time   NA 139 07/30/2014 0650   NA 134* 05/22/2012 0412   K 3.9 07/30/2014 0650   K 4.5 05/22/2012 0412   CL 109 07/30/2014 0650   CL 102 05/22/2012 0412   CO2 27 07/30/2014 0650   CO2 26 05/22/2012 0412   GLUCOSE 98 07/30/2014 0650   GLUCOSE 141* 05/22/2012 0412   BUN 19 07/30/2014 0650   BUN 60* 05/22/2012 0412   CREATININE 0.70 07/30/2014 0650   CREATININE 1.26 05/22/2012 0412   CALCIUM 7.3* 07/30/2014 0650   CALCIUM 8.6 05/22/2012 0412   PROT 7.3 05/22/2012 0412   ALBUMIN 3.1* 05/22/2012 0412   AST 37 05/22/2012 0412   ALT 24 05/22/2012 0412   ALKPHOS 74 05/22/2012 0412   GFRNONAA >60 07/30/2014 0650   GFRNONAA 40* 05/22/2012 0412   GFRAA >60 07/30/2014 0650   GFRAA 46* 05/22/2012 0412   PT/INR No results for input(s): LABPROT, INR in the last 72 hours.  Studies/Results: No results found.  Assessment/Plan: Resolved small bowel obstruction. Discharge home.

## 2014-08-01 NOTE — Care Management (Signed)
Spoke with patient who is up for discharge. Patient is alert and oriented and in good spirits.  She stated that she uses a Retail banker and has a wheelchair at home. She stated that she if fairly independent and that she is able to perform ADLs.  She relies on son for help with transportation who lives very close to her. Does not use 02 at home. PCP is Dr Graciela Husbands. Is able to afford medications. Patient has ambulated around the floor with son several time per self report. No CM need identified.

## 2014-08-01 NOTE — Progress Notes (Signed)
Patient going home today and wants to wait until she gets home to have a bath.

## 2014-08-01 NOTE — Progress Notes (Signed)
Pt a A and O x 4 . VSS. Pt tolerating diet well. No complaints of pain or nausea. No skin issues. Pt was given discharge instructions an voiced that she understood with no further questions. Pt left via wheelchair with nurse aide.

## 2014-08-10 ENCOUNTER — Ambulatory Visit: Payer: Self-pay | Admitting: Surgery

## 2014-08-17 ENCOUNTER — Encounter: Payer: Self-pay | Admitting: Surgery

## 2014-08-17 ENCOUNTER — Ambulatory Visit (INDEPENDENT_AMBULATORY_CARE_PROVIDER_SITE_OTHER): Payer: Medicare Other | Admitting: Surgery

## 2014-08-17 VITALS — BP 129/69 | HR 65 | Temp 98.4°F | Ht 66.0 in | Wt 155.4 lb

## 2014-08-17 DIAGNOSIS — K565 Intestinal adhesions [bands], unspecified as to partial versus complete obstruction: Secondary | ICD-10-CM

## 2014-08-17 NOTE — Patient Instructions (Signed)
Follow-up with our office as needed.  Please call with any questions or concerns.

## 2014-08-19 NOTE — Discharge Summary (Signed)
Patient ID: [name] MRN: [id number] DOB/AGE: 79-Dec-1929 79 y.o.  Admit date: 07/27/2014 Discharge date: 08/19/2014  Discharge Diagnoses:  Small bowel obstruction  Ventral hernia  Procedures Performed: None  Discharged Condition: fair  Hospital Course: She was admitted on [facility] 2 with symptoms consistent with partial small bowel obstruction. She's had intermittent problems over the last several years. She's had multiple previous abdominal surgeries. CT scan on admission demonstrated what appeared to be transition zone away from her abdominal wall hernia consistent with previous bowel obstructions. She has significant chronic obstructive lung disease after discussing the situation with her children and the patient we elected to treat her conservatively.  She declines nasogastric suction and underwent IV rehydration. Her symptoms improved over the next 48 hours. She is able tolerate liquid diet and was passing gas without difficulty. We were able start regular diet on the fifth and she was up active tolerating a standard diet by [place] 6. She was discharged home at that time for follow-up in the office.  Discharge Orders: Discharge Instructions    Call MD for:  persistant nausea and vomiting    Complete by:  As directed      Call MD for:  severe uncontrolled pain    Complete by:  As directed      Diet - low sodium heart healthy    Complete by:  As directed      Increase activity slowly    Complete by:  As directed            Disposition: 01-Home or Self Care  Discharge Medications: No current facility-administered medications for this encounter.  Current outpatient prescriptions:  .  albuterol (PROVENTIL HFA;VENTOLIN HFA) 108 (90 BASE) MCG/ACT inhaler, Inhale 2 puffs into the lungs 2 (two) times daily., Disp: , Rfl:  .  amitriptyline (ELAVIL) 75 MG tablet, Take 75 mg by mouth at bedtime., Disp: , Rfl:  .  aspirin 81 MG chewable tablet, Chew 81 mg by mouth daily., Disp: , Rfl:   .  celecoxib (CELEBREX) 200 MG capsule, Take 200 mg by mouth 2 (two) times daily., Disp: , Rfl:  .  digoxin (LANOXIN) 0.125 MG tablet, Take 0.125 mg by mouth daily., Disp: , Rfl:  .  Fluticasone-Salmeterol (ADVAIR) 100-50 MCG/DOSE AEPB, Inhale 1 puff into the lungs 2 (two) times daily., Disp: , Rfl:  .  furosemide (LASIX) 20 MG tablet, Take 60 mg by mouth daily., Disp: , Rfl:  .  losartan (COZAAR) 25 MG tablet, Take 25 mg by mouth daily., Disp: , Rfl:  .  omeprazole (PRILOSEC) 20 MG capsule, Take 20 mg by mouth daily. , Disp: , Rfl:  .  tiotropium (SPIRIVA) 18 MCG inhalation capsule, Place 18 mcg into inhaler and inhale daily., Disp: , Rfl:  .  magnesium oxide (MAG-OX) 400 MG tablet, Take 800 mg by mouth daily., Disp: , Rfl:  .  metroNIDAZOLE (FLAGYL) 500 MG tablet, Take 1 tablet by mouth every 8 (eight) hours as needed., Disp: , Rfl: 1 .  polyethylene glycol (MIRALAX / GLYCOLAX) packet, Take 17 g by mouth daily., Disp: , Rfl:   Follwup: Follow-up Information    Follow up with [facility] On 08/10/2014.   Why:  at 9:15am in the [place] office with Dr. Anda Kraft for follow up SBO   Contact information:   [address] [place] [address] [phone]      Signed: Tiney Rouge III 08/19/2014, 7:54 PM

## 2014-08-22 NOTE — Progress Notes (Signed)
Patient ID: [name], female   DOB: [birth year], 81 y.o.   MRN: [id number]  Chief Complaint  Patient presents with  . Hospitalization Follow-up    Small Bowel Obstruction    [name] is a 81 y.o. female. Recently discharged from the hospital with a partial small bowel obstruction which resolved without nasogastric tube insertion. The patient's had a previous history of multiple partial small bowel obstructions and multiple previous abdominal procedures. Today she is feeling well. She is on chronic laxities. She is accompanied by her son. Prior to this she was on chronic Flagyl.  HPI  Past Medical History  Diagnosis Date  . Asthma   . COPD (chronic obstructive pulmonary disease)   . CHF (congestive heart failure)   . Cardiomyopathy 1990  . Bacteremia 2007    asystole pacemaker failed to capture due to lead wire  . Atrial fibrillation     elected not to take coumadin  . Bowel obstruction     multiple  . 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 Multiple gastric ulcers     in recent years  . Ulcerative colitis     2007    Past Surgical History  Procedure Laterality Date  . Pacemaker insertion  2003  . Ventricular cardiac pacemaker insertion      biventricular pacer with defib implanted 2007 concerto [id number] serial [id number]    Family History  Problem Relation Age of Onset  . Cancer Mother     Uterine  . Diabetes Father   . Heart disease Father     Social History History  Substance Use Topics  . Smoking status: Former Games developer  . Smokeless tobacco: Not on file  . Alcohol Use: No    No Known Allergies  Current Outpatient Prescriptions  Medication Sig Dispense Refill  . albuterol (PROVENTIL HFA;VENTOLIN HFA) 108 (90 BASE) MCG/ACT inhaler Inhale 2 puffs into the lungs 2 (two) times daily.    Marland Kitchen amitriptyline (ELAVIL) 75 MG tablet Take 75 mg by mouth at bedtime.    Marland Kitchen aspirin 81 MG chewable tablet Chew 81 mg by mouth daily.    . celecoxib  (CELEBREX) 200 MG capsule Take 200 mg by mouth 2 (two) times daily.    . digoxin (LANOXIN) 0.125 MG tablet Take 0.125 mg by mouth daily.    . Fluticasone-Salmeterol (ADVAIR) 100-50 MCG/DOSE AEPB Inhale 1 puff into the lungs 2 (two) times daily.    . furosemide (LASIX) 20 MG tablet Take 60 mg by mouth daily.    Marland Kitchen losartan (COZAAR) 25 MG tablet Take 25 mg by mouth daily.    . magnesium oxide (MAG-OX) 400 MG tablet Take 800 mg by mouth daily.    . metroNIDAZOLE (FLAGYL) 500 MG tablet Take 1 tablet by mouth every 8 (eight) hours as needed.  1  . omeprazole (PRILOSEC) 20 MG capsule Take 20 mg by mouth daily.     . polyethylene glycol (MIRALAX / GLYCOLAX) packet Take 17 g by mouth daily.    Marland Kitchen tiotropium (SPIRIVA) 18 MCG inhalation capsule Place 18 mcg into inhaler and inhale daily.     No current facility-administered medications for this visit.      Review of Systems A 10 point review of systems was asked and was negative except for the following positive findings as outlined above.  Blood pressure 129/69, pulse 65, temperature 98.4 F (36.9 C), temperature source Oral, height 5\' 6"  (1.676 m), weight 155 lb  6.4 oz (70.489 kg).  Physical Exam CONSTITUTIONAL:  Pleasant, well-developed, well-nourished, and in no acute distress. EYES: Pupils equal and reactive to light, Sclera non-icteric EARS, NOSE, MOUTH AND THROAT:  The oropharynx was clear.  Dentition is good repair.  Oral mucosa pink and moist. LYMPH NODES:  Lymph nodes in the neck and axillae were normal RESPIRATORY:  Lungs were clear.  Normal respiratory effort without pathologic use of accessory muscles of respiration CARDIOVASCULAR: Heart was regular without murmurs.  There were no carotid bruits. GI: The abdomen was soft, nontender, and nondistended. There were no palpable masses. There was no hepatosplenomegaly. There were normal bowel sounds in all quadrants. Scars are present. There is no tenderness. There is a large reducible  ventral hernia.  GU:  Rectal deferred.   MUSCULOSKELETAL:  Normal muscle strength and tone.  No clubbing or cyanosis.   SKIN:  There were no pathologic skin lesions.  There were no nodules on palpation. NEUROLOGIC:  Sensation is normal.  Cranial nerves are grossly intact. PSYCH:  Oriented to person, place and time.  Mood and affect are normal.  Data Reviewed CT scans from the hospitalization were reviewed.  I have personally reviewed the patient's imaging, laboratory findings and medical records.    Assessment    81 year old white female with chronic intermittent partial small bowel obstruction and chronic constipation. Currently she is doing well.    Plan    The refill was provided for 250 mg by mouth Flagyl by mouth daily.  She will see [place] back in the office as needed.  She has a follow-up with her PCP next week.       Natale Lay 08/22/2014, 1:27 PM

## 2014-10-11 ENCOUNTER — Inpatient Hospital Stay
Admission: EM | Admit: 2014-10-11 | Discharge: 2014-10-13 | DRG: 389 | Disposition: A | Discharge status: Home / Self Care | Payer: Medicare Other | Attending: General Surgery | Admitting: General Surgery

## 2014-10-11 ENCOUNTER — Emergency Department: Payer: Medicare Other

## 2014-10-11 ENCOUNTER — Encounter: Payer: Self-pay | Admitting: Emergency Medicine

## 2014-10-11 DIAGNOSIS — Z9981 Dependence on supplemental oxygen: Secondary | ICD-10-CM | POA: Diagnosis not present

## 2014-10-11 DIAGNOSIS — Z888 Allergy status to other drugs, medicaments and biological substances status: Secondary | ICD-10-CM

## 2014-10-11 DIAGNOSIS — Z7951 Long term (current) use of inhaled steroids: Secondary | ICD-10-CM | POA: Diagnosis not present

## 2014-10-11 DIAGNOSIS — Z8711 Personal history of peptic ulcer disease: Secondary | ICD-10-CM

## 2014-10-11 DIAGNOSIS — I509 Heart failure, unspecified: Secondary | ICD-10-CM | POA: Diagnosis present

## 2014-10-11 DIAGNOSIS — K5660 Unspecified intestinal obstruction: Secondary | ICD-10-CM | POA: Diagnosis not present

## 2014-10-11 DIAGNOSIS — Z833 Family history of diabetes mellitus: Secondary | ICD-10-CM | POA: Diagnosis not present

## 2014-10-11 DIAGNOSIS — Z87891 Personal history of nicotine dependence: Secondary | ICD-10-CM | POA: Diagnosis not present

## 2014-10-11 DIAGNOSIS — I429 Cardiomyopathy, unspecified: Secondary | ICD-10-CM | POA: Diagnosis present

## 2014-10-11 DIAGNOSIS — Z808 Family history of malignant neoplasm of other organs or systems: Secondary | ICD-10-CM

## 2014-10-11 DIAGNOSIS — Z95810 Presence of automatic (implantable) cardiac defibrillator: Secondary | ICD-10-CM | POA: Diagnosis not present

## 2014-10-11 DIAGNOSIS — Z8674 Personal history of sudden cardiac arrest: Secondary | ICD-10-CM | POA: Diagnosis not present

## 2014-10-11 DIAGNOSIS — Z8249 Family history of ischemic heart disease and other diseases of the circulatory system: Secondary | ICD-10-CM

## 2014-10-11 DIAGNOSIS — Z7982 Long term (current) use of aspirin: Secondary | ICD-10-CM

## 2014-10-11 DIAGNOSIS — I4891 Unspecified atrial fibrillation: Secondary | ICD-10-CM | POA: Diagnosis present

## 2014-10-11 DIAGNOSIS — Z79899 Other long term (current) drug therapy: Secondary | ICD-10-CM | POA: Diagnosis not present

## 2014-10-11 DIAGNOSIS — J449 Chronic obstructive pulmonary disease, unspecified: Secondary | ICD-10-CM | POA: Diagnosis present

## 2014-10-11 DIAGNOSIS — R0902 Hypoxemia: Secondary | ICD-10-CM | POA: Diagnosis present

## 2014-10-11 DIAGNOSIS — K565 Intestinal adhesions [bands], unspecified as to partial versus complete obstruction: Secondary | ICD-10-CM | POA: Diagnosis present

## 2014-10-11 DIAGNOSIS — J45909 Unspecified asthma, uncomplicated: Secondary | ICD-10-CM | POA: Diagnosis present

## 2014-10-11 DIAGNOSIS — K5669 Other intestinal obstruction: Secondary | ICD-10-CM | POA: Diagnosis not present

## 2014-10-11 DIAGNOSIS — K56609 Unspecified intestinal obstruction, unspecified as to partial versus complete obstruction: Secondary | ICD-10-CM | POA: Diagnosis present

## 2014-10-11 LAB — COMPREHENSIVE METABOLIC PANEL
ALBUMIN: 3.9 g/dL (ref 3.5–5.0)
ALT: 23 U/L (ref 14–54)
AST: 31 U/L (ref 15–41)
Alkaline Phosphatase: 60 U/L (ref 38–126)
Anion gap: 13 (ref 5–15)
BUN: 29 mg/dL — AB (ref 6–20)
CHLORIDE: 97 mmol/L — AB (ref 101–111)
CO2: 29 mmol/L (ref 22–32)
Calcium: 9.8 mg/dL (ref 8.9–10.3)
Creatinine, Ser: 0.89 mg/dL (ref 0.44–1.00)
GFR calc Af Amer: >60 mL/min (ref >60)
GFR calc non Af Amer: 57 mL/min — ABNORMAL LOW (ref >60)
GLUCOSE: 149 mg/dL — AB (ref 65–99)
POTASSIUM: 4.1 mmol/L (ref 3.5–5.1)
Sodium: 139 mmol/L (ref 135–145)
Total Bilirubin: 0.6 mg/dL (ref 0.3–1.2)
Total Protein: 7.6 g/dL (ref 6.5–8.1)

## 2014-10-11 LAB — CBC WITH DIFFERENTIAL/PLATELET
BASOS ABS: 0 10*3/uL (ref 0–0.1)
Eosinophils Absolute: 0 10*3/uL (ref 0–0.7)
HCT: 39.5 % (ref 35.0–47.0)
Hemoglobin: 12.9 g/dL (ref 12.0–16.0)
Lymphocytes Relative: 6 %
Lymphs Abs: 1 10*3/uL (ref 1.0–3.6)
MCH: 32.4 pg (ref 26.0–34.0)
MCHC: 32.7 g/dL (ref 32.0–36.0)
MCV: 99 fL (ref 80.0–100.0)
MONO ABS: 0.8 10*3/uL (ref 0.2–0.9)
Monocytes Relative: 5 %
NEUTROS ABS: 13.8 10*3/uL — AB (ref 1.4–6.5)
Neutrophils Relative %: 88 %
PLATELETS: 168 10*3/uL (ref 150–440)
RBC: 3.99 MIL/uL (ref 3.80–5.20)
RDW: 14.1 % (ref 11.5–14.5)
WBC: 15.6 10*3/uL — ABNORMAL HIGH (ref 3.6–11.0)

## 2014-10-11 LAB — URINALYSIS COMPLETE WITH MICROSCOPIC (ARMC ONLY)
BILIRUBIN URINE: NEGATIVE
Bacteria, UA: NONE SEEN
GLUCOSE, UA: NEGATIVE mg/dL
HGB URINE DIPSTICK: NEGATIVE
KETONES UR: NEGATIVE mg/dL
LEUKOCYTES UA: NEGATIVE
Nitrite: NEGATIVE
Protein, ur: NEGATIVE mg/dL
Specific Gravity, Urine: 1.015 (ref 1.005–1.030)
pH: 7 (ref 5.0–8.0)

## 2014-10-11 LAB — SAMPLE TO BLOOD BANK

## 2014-10-11 LAB — DIGOXIN LEVEL: DIGOXIN LVL: 1.1 ng/mL (ref 0.8–2.0)

## 2014-10-11 LAB — LIPASE, BLOOD: LIPASE: 13 U/L — AB (ref 22–51)

## 2014-10-11 LAB — TROPONIN I: Troponin I: 0.03 ng/mL (ref <0.031)

## 2014-10-11 LAB — BRAIN NATRIURETIC PEPTIDE: B NATRIURETIC PEPTIDE 5: 148 pg/mL — AB (ref 0.0–100.0)

## 2014-10-11 MED ORDER — DIGOXIN 125 MCG PO TABS
0.1250 mg | ORAL_TABLET | Freq: Every day | ORAL | Status: DC
Start: 2014-10-11 — End: 2014-10-13
  Administered 2014-10-11 – 2014-10-13 (×3): 0.125 mg via ORAL
  Filled 2014-10-11 (×3): qty 1

## 2014-10-11 MED ORDER — SODIUM CHLORIDE 0.9 % IV BOLUS (SEPSIS)
1000.0000 mL | Freq: Once | INTRAVENOUS | Status: AC
  Administered 2014-10-11: 1000 mL via INTRAVENOUS

## 2014-10-11 MED ORDER — IOHEXOL 240 MG/ML SOLN
50.0000 mL | INTRAMUSCULAR | Status: AC
  Administered 2014-10-11 (×2): 50 mL via ORAL

## 2014-10-11 MED ORDER — ONDANSETRON HCL 4 MG/2ML IJ SOLN
4.0000 mg | Freq: Once | INTRAMUSCULAR | Status: AC
  Administered 2014-10-11: 4 mg via INTRAVENOUS
  Filled 2014-10-11: qty 2

## 2014-10-11 MED ORDER — ONDANSETRON 4 MG PO TBDP: 4.0000 mg | ORAL_TABLET | Freq: Four times a day (QID) | ORAL | Status: DC | PRN

## 2014-10-11 MED ORDER — IOHEXOL 300 MG/ML  SOLN
100.0000 mL | Freq: Once | INTRAMUSCULAR | Status: AC | PRN
  Administered 2014-10-11: 100 mL via INTRAVENOUS

## 2014-10-11 MED ORDER — KCL IN DEXTROSE-NACL 20-5-0.45 MEQ/L-%-% IV SOLN
INTRAVENOUS | Status: DC
  Administered 2014-10-11 – 2014-10-12 (×3): via INTRAVENOUS
  Filled 2014-10-11 (×8): qty 1000

## 2014-10-11 MED ORDER — HYDRALAZINE HCL 20 MG/ML IJ SOLN: 10.0000 mg | INTRAMUSCULAR | Status: DC | PRN

## 2014-10-11 MED ORDER — ALBUTEROL SULFATE (2.5 MG/3ML) 0.083% IN NEBU
3.0000 mL | INHALATION_SOLUTION | Freq: Two times a day (BID) | RESPIRATORY_TRACT | Status: DC
  Administered 2014-10-11 – 2014-10-13 (×4): 3 mL via RESPIRATORY_TRACT
  Filled 2014-10-11 (×4): qty 3

## 2014-10-11 MED ORDER — AMITRIPTYLINE HCL 25 MG PO TABS
75.0000 mg | ORAL_TABLET | Freq: Every day | ORAL | Status: DC
  Administered 2014-10-11 – 2014-10-12 (×2): 75 mg via ORAL
  Filled 2014-10-11 (×3): qty 3

## 2014-10-11 MED ORDER — MOMETASONE FURO-FORMOTEROL FUM 100-5 MCG/ACT IN AERO
2.0000 | INHALATION_SPRAY | Freq: Two times a day (BID) | RESPIRATORY_TRACT | Status: DC
  Administered 2014-10-12 – 2014-10-13 (×3): 2 via RESPIRATORY_TRACT
  Filled 2014-10-11: qty 8.8

## 2014-10-11 MED ORDER — ONDANSETRON HCL 4 MG/2ML IJ SOLN
4.0000 mg | Freq: Four times a day (QID) | INTRAMUSCULAR | Status: DC | PRN
Start: 2014-10-11 — End: 2014-10-13

## 2014-10-11 MED ORDER — TIOTROPIUM BROMIDE MONOHYDRATE 18 MCG IN CAPS
18.0000 ug | ORAL_CAPSULE | Freq: Every day | RESPIRATORY_TRACT | Status: DC
  Administered 2014-10-11 – 2014-10-13 (×3): 18 ug via RESPIRATORY_TRACT
  Filled 2014-10-11: qty 5

## 2014-10-11 MED ORDER — LOSARTAN POTASSIUM 50 MG PO TABS
25.0000 mg | ORAL_TABLET | Freq: Every day | ORAL | Status: DC
  Administered 2014-10-11 – 2014-10-13 (×3): 25 mg via ORAL
  Filled 2014-10-11: qty 1
  Filled 2014-10-11: qty 2
  Filled 2014-10-11: qty 1

## 2014-10-11 MED ORDER — MORPHINE SULFATE (PF) 2 MG/ML IV SOLN: 2.0000 mg | INTRAVENOUS | Status: DC | PRN

## 2014-10-11 MED ORDER — ALBUTEROL SULFATE (2.5 MG/3ML) 0.083% IN NEBU: 3.0000 mL | INHALATION_SOLUTION | Freq: Two times a day (BID) | RESPIRATORY_TRACT | Status: DC

## 2014-10-11 MED ORDER — ENOXAPARIN SODIUM 40 MG/0.4ML ~~LOC~~ SOLN
40.0000 mg | SUBCUTANEOUS | Status: DC
  Administered 2014-10-11 – 2014-10-12 (×2): 40 mg via SUBCUTANEOUS
  Filled 2014-10-11 (×2): qty 0.4

## 2014-10-11 NOTE — ED Provider Notes (Signed)
CSN: [id number]     Arrival date & time 10/11/14  1057 History   First MD Initiated Contact with Patient 10/11/14 1059     Chief Complaint  Patient presents with  . Abdominal Pain     (Consider location/radiation/quality/duration/timing/severity/associated sxs/prior Treatment) The history is provided by the patient.  [name] is a 81 y.o. female hx of COPD, CHF, afib not on anticoagulation, multiple abdominal surgeries here with abdominal pain, vomiting. Patient has bilious and yellow vomit since this morning. Has lower abdominal pain as well. Denies fevers. Still passing some gas, last bowel movement was yesterday. Was admitted several months ago for SBO and resolved with NG tube.    Past Medical History  Diagnosis Date  . Asthma   . COPD (chronic obstructive pulmonary disease)   . CHF (congestive heart failure)   . Cardiomyopathy 1990  . Bacteremia 2007    asystole pacemaker failed to capture due to lead wire  . Atrial fibrillation     elected not to take coumadin  . Bowel obstruction     multiple  . 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 Multiple gastric ulcers     in recent years  . Ulcerative colitis     2007   Past Surgical History  Procedure Laterality Date  . Pacemaker insertion  2003  . Ventricular cardiac pacemaker insertion      biventricular pacer with defib implanted 2007 concerto [id number] serial [id number]   Family History  Problem Relation Age of Onset  . Cancer Mother     Uterine  . Diabetes Father   . Heart disease Father    Social History  Substance Use Topics  . Smoking status: Former Games developer  . Smokeless tobacco: None  . Alcohol Use: No   OB History    No data available     Review of Systems  Gastrointestinal: Positive for vomiting and abdominal pain.  All other systems reviewed and are negative.     Allergies  Review of patient's allergies indicates no known allergies.  Home Medications   Prior to Admission  medications   Medication Sig Start Date End Date Taking? Authorizing Provider  albuterol (PROVENTIL HFA;VENTOLIN HFA) 108 (90 BASE) MCG/ACT inhaler Inhale 2 puffs into the lungs 2 (two) times daily.    Historical Provider, MD  amitriptyline (ELAVIL) 75 MG tablet Take 75 mg by mouth at bedtime.    Historical Provider, MD  aspirin 81 MG chewable tablet Chew 81 mg by mouth daily.    Historical Provider, MD  celecoxib (CELEBREX) 200 MG capsule Take 200 mg by mouth 2 (two) times daily.    Historical Provider, MD  digoxin (LANOXIN) 0.125 MG tablet Take 0.125 mg by mouth daily.    Historical Provider, MD  Fluticasone-Salmeterol (ADVAIR) 100-50 MCG/DOSE AEPB Inhale 1 puff into the lungs 2 (two) times daily.    Historical Provider, MD  furosemide (LASIX) 20 MG tablet Take 60 mg by mouth daily.    Historical Provider, MD  losartan (COZAAR) 25 MG tablet Take 25 mg by mouth daily.    Historical Provider, MD  magnesium oxide (MAG-OX) 400 MG tablet Take 800 mg by mouth daily.    Historical Provider, MD  metroNIDAZOLE (FLAGYL) 500 MG tablet Take 1 tablet by mouth every 8 (eight) hours as needed. 06/21/14   Historical Provider, MD  omeprazole (PRILOSEC) 20 MG capsule Take 20 mg by mouth daily.     Historical Provider, MD  polyethylene glycol (MIRALAX / GLYCOLAX) packet Take 17 g by mouth daily.    Historical Provider, MD  tiotropium (SPIRIVA) 18 MCG inhalation capsule Place 18 mcg into inhaler and inhale daily.    Historical Provider, MD   BP 116/87 mmHg  Pulse 95  Temp(Src) 97.8 F (36.6 C) (Oral)  Resp 19  Ht 5\' 3"  (1.6 m)  Wt 160 lb (72.576 kg)  BMI 28.35 kg/m2  SpO2 96% Physical Exam  Constitutional: She is oriented to person, place, and time.  Chronically ill, dehydrated   HENT:  Head: Normocephalic.  Eyes: Conjunctivae are normal. Pupils are equal, round, and reactive to light.  Neck: Normal range of motion. Neck supple.  Cardiovascular: Normal rate, regular rhythm and normal heart sounds.    Pulmonary/Chest: Effort normal.  Crackles bilateral bases   Abdominal: Soft. Bowel sounds are normal.  Mild lower abdominal tenderness, + distended   Musculoskeletal: Normal range of motion.  1+ edema bilaterally   Neurological: She is alert and oriented to person, place, and time.  Skin: Skin is warm and dry.  Psychiatric: She has a normal mood and affect. Her behavior is normal. Judgment and thought content normal.  Nursing note and vitals reviewed.   ED Course  Procedures (including critical care time) Labs Review Labs Reviewed  URINALYSIS COMPLETEWITH MICROSCOPIC ([facility] ONLY) - Abnormal; Notable for the following:    Color, Urine AMBER (*)    APPearance CLOUDY (*)    Squamous Epithelial / LPF 0-5 (*)    All other components within normal limits  CBC WITH DIFFERENTIAL/PLATELET  COMPREHENSIVE METABOLIC PANEL  LIPASE, BLOOD  TROPONIN I  DIGOXIN LEVEL  BRAIN NATRIURETIC PEPTIDE  SAMPLE TO BLOOD BANK    Imaging Review Ct Abdomen Pelvis W Contrast  10/11/2014   CLINICAL DATA:  Nausea and vomiting with abdominal pain starting this morning. History of bowel obstructions. Ulcerative colitis with prior colon resection.  EXAM: CT ABDOMEN AND PELVIS WITH CONTRAST  TECHNIQUE: Multidetector CT imaging of the abdomen and pelvis was performed using the standard protocol following bolus administration of intravenous contrast.  CONTRAST:  OMNIPAQUE IOHEXOL 300 MG/ML  SOLN  COMPARISON:  Multiple exams, including 07/28/2014  FINDINGS: Lower chest: Bandlike and nodular opacities in the right middle lobe have an inflammatory appearance on images 1 through 7 of series 4. 0.7 by 0.6 cm left lower lobe nodule, image 5 series 4, previously 0.8 by 0.5 cm on 06/04/2009. Various leads project over the atrial appendage and right heart. Old rib deformities noted from prior fractures.  Hepatobiliary: Mild intrahepatic biliary dilatation. Common bile duct 1.1 cm diameter. Gallbladder absent.  Pancreas:  Atrophic.  Spleen: Unremarkable  Adrenals/Urinary Tract: Stable fluid density lesions the right kidney, largest 2.0 cm in the lower pole, compatible with cysts. Smaller nonspecific hypodense lesions in the left kidney are likely cysts as well.  Stomach/Bowel: Distended dome stomach with mildly dilated loops of proximal small bowel. Progressive dilution of contrast column in the anatomic pelvis. Mildly angulated loops of small bowel in the left lower quadrant as on images 47 through 51 of series 2 including in the vicinity of apparent transition in caliber on image 50 of series 2, raising the possibility of adhesions. Proximal small bowel loops up to 4 cm. Distal small bowel loops are nondistended.  Prominence of stool in the colon. Rectosigmoid anastomotic staple line observed.  Vascular/Lymphatic: Aortoiliac atherosclerotic vascular disease.  Reproductive: Uterus absent.  Ovaries not well seen.  Other: No supplemental non-categorized findings.  Musculoskeletal: Right paracentral ventral hernia contains nondilated loops of small bowel as on image 50 series 2. Hernia mesh is present in the upper abdomen along the midline. To the left of this are several ventral herniations of omental adipose tissue, as on images 27-41 of series 2.  As before, there is prominent lumbar spondylosis and degenerative disc disease along with a chronic appearing superior endplate compression fracture at L2. Probable impingement at the L3-4 level.  IMPRESSION: 1. Mild small bowel obstruction, with angulated loop of small bowel in the left lower quadrant anteriorly at the lead point, suspicious for adhesion. 2. Mild prominence of stool in the colon may reflect constipation or functional colonic abnormality. 3. Several hernias are present including a right lower quadrant ventral hernia containing loops of small bowel and left upper abdominal paramedian herniations of omental adipose tissue. None of these appear to be contributing to the  obstruction. 4. Chronic intrahepatic and extrahepatic biliary dilatation. Although potentially related to the cholecystectomy, other causes of chronic biliary dilatation are not excluded. 5. Lumbar spondylosis and degenerative disc disease, with suspected impingement at the L3-4 level. 6. Chronic left lower lobe pulmonary nodule is stable for years and considered benign. Clustered nodularity in the right middle lobe appears inflammatory, likely from atypical infectious process.   Electronically Signed   By: Gaylyn Rong M.D.   On: 10/11/2014 14:19   Dg Chest Port 1 View  10/11/2014   CLINICAL DATA:  Nausea, vomiting since this morning. History of bowel obstruction. Low O2 sats  EXAM: PORTABLE CHEST - 1 VIEW  COMPARISON:  08/26/2011  FINDINGS: Right pacer remains in place, unchanged. Heart is normal size. Mediastinal contours are within normal limits. No confluent airspace opacity or effusion. No acute bony abnormality.  IMPRESSION: No active disease.   Electronically Signed   By: Charlett Nose M.D.   On: 10/11/2014 11:53   I have personally reviewed and evaluated these images and lab results as part of my medical decision-making.   EKG Interpretation None       ED ECG REPORT I, YAO, DAVID, the attending physician, personally viewed and interpreted this ECG.   Date: 10/11/2014  EKG Time: 11:07  Rate: 110  Rhythm: sinus tachycardia  Axis: normal  Intervals:none  ST&T Change: PVCs               MDM   Final diagnoses:  None   [name] is a 81 y.o. female here with vomiting, ab pain. Likely SBO again. She is hypoxic to 89% but patient is on oxygen at home and patient states that this is baseline and has no worsening shortness of breath or chest pain. Will get labs, CXR, CT ab/pel   2:59 PM CT showed SBO. NG tube ordered. Called Dr. Tonita Cong, surgery, who will see patient and likely admit.    Richardean Canal, MD 10/11/14 [phone]

## 2014-10-11 NOTE — ED Notes (Signed)
Pt reports that she has had nausea and vomiting this morning with some abdominal pain. Pt reports hx of bowel obstructions. Pt reports that her pain is 4/10 right now, but has been more intense at times this morning. Pt alert & oriented with warm, dry skin. NAD noted.

## 2014-10-11 NOTE — ED Notes (Signed)
Resumed care from [facility] rn.  Pt alert.  Family at bedside.  Pt waiting on admission.

## 2014-10-11 NOTE — ED Notes (Signed)
Pt reports abd pain onset this am. N/v.  pts O2 sat 89 but states that's normal for her, no resp distress

## 2014-10-11 NOTE — H&P (Signed)
Patient ID: [name], female   DOB: 12/30/1927, 81 y.o.   MRN: [id number]  History of Present Illness HPI: [name] is a pleasant 81 yo F with history of COPD and cardiomyopathy who presents with 1 day of mild abdominal pain, nausea/vomiting. She reports that she has not had any vomiting since about 9 this morning but has remained distended Has had extensive surgical history including colectomy with colostomy, ventral hernia s/p repair with recurrence and has been admitted multiple times by our service in 2012 and 2013 and earlier this year for presumed small bowel obstruction vs constipation. No vomiting while in ER but has received nausea medicine. BM yesterday, unsure if passing flatus today. Mild abdominal pain but has chronic intermittent abdominal pain at baseline.  No fevers/chills, headache, chest pain, shortness of breath, cough, dysuria/hematuria.  Past Medical History Past Medical History  Diagnosis Date  . Asthma   . COPD (chronic obstructive pulmonary disease)   . CHF (congestive heart failure)   . Cardiomyopathy 1990  . Bacteremia 2007    asystole pacemaker failed to capture due to lead wire  . Atrial fibrillation     elected not to take coumadin  . Bowel obstruction     multiple  . 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 Multiple gastric ulcers     in recent years  . Ulcerative colitis     2007    unchanged  Past Surgical History  Procedure Laterality Date  . Pacemaker insertion  2003  . Ventricular cardiac pacemaker insertion      biventricular pacer with defib implanted 2007 concerto [id number] serial [id number]    Allergies  Allergen Reactions  . Toprol Xl [Metoprolol] Other (See Comments)    Reaction: skin pulls away from the bottom of feet    No current facility-administered medications for this encounter.   Current Outpatient Prescriptions  Medication Sig Dispense Refill  . albuterol (PROVENTIL HFA;VENTOLIN HFA) 108 (90 BASE)  MCG/ACT inhaler Inhale 2 puffs into the lungs 2 (two) times daily.    Marland Kitchen amitriptyline (ELAVIL) 75 MG tablet Take 75 mg by mouth at bedtime.    Marland Kitchen aspirin 81 MG chewable tablet Chew 81 mg by mouth daily.    . celecoxib (CELEBREX) 200 MG capsule Take 200 mg by mouth 2 (two) times daily.    . digoxin (LANOXIN) 0.125 MG tablet Take 0.125 mg by mouth daily.    . Fluticasone-Salmeterol (ADVAIR) 100-50 MCG/DOSE AEPB Inhale 1 puff into the lungs 2 (two) times daily.    . furosemide (LASIX) 20 MG tablet Take 60 mg by mouth daily.    Marland Kitchen losartan (COZAAR) 25 MG tablet Take 25 mg by mouth daily.    Marland Kitchen omeprazole (PRILOSEC) 20 MG capsule Take 20 mg by mouth daily.     . polyethylene glycol (MIRALAX / GLYCOLAX) packet Take 17 g by mouth daily.    Marland Kitchen tiotropium (SPIRIVA) 18 MCG inhalation capsule Place 18 mcg into inhaler and inhale daily.      Family History Family History  Problem Relation Age of Onset  . Cancer Mother     Uterine  . Diabetes Father   . Heart disease Father      unchanged  Social History Social History  Substance Use Topics  . Smoking status: Former Games developer  . Smokeless tobacco: None  . Alcohol Use: No     No tobacco use in 10 years, occasional wine consumption only  Review of Systems  Constitutional: Negative for fever, chills and malaise/fatigue.  HENT: Negative for congestion.   Eyes: Negative for photophobia and pain.  Respiratory: Negative for cough.   Cardiovascular: Negative for chest pain and palpitations.  Gastrointestinal: Positive for nausea, vomiting and abdominal pain. Negative for heartburn, diarrhea, constipation, blood in stool and melena.  Genitourinary: Negative for dysuria.  Skin: Negative for itching.  Neurological: Negative for dizziness and headaches.  Endo/Heme/Allergies: Does not bruise/bleed easily.  Psychiatric/Behavioral: Negative for substance abuse.     Physical Exam Blood pressure 110/81, pulse 77, temperature 97.8 F (36.6 C),  temperature source Oral, resp. rate 13, height 5\' 3"  (1.6 m), weight 72.576 kg (160 lb), SpO2 93 %.  CONSTITUTIONAL: No acute distress EYES: Pupils equal, round, and reactive to light, Sclera non-icteric. EARS, NOSE, MOUTH AND THROAT: The oropharynx is clear. Oral mucosa is pink and moist. Hearing is intact to voice.  NECK: Trachea is midline, and there is no jugular venous distension. Thyroid is without palpable abnormalities. LYMPH NODES:  Lymph nodes in the neck are not enlarged. RESPIRATORY:  Lungs are clear, and breath sounds are equal bilaterally. Normal respiratory effort without pathologic use of accessory muscles. CARDIOVASCULAR: Heart is regular without murmurs, gallops, or rubs. Pacer/Defibrilator present in right chest GI: The abdomen is  soft, nontender, and minimally distended. There is a palpable hernia in her right lower abdomen that is soft and easily reducible. There was no hepatosplenomegaly. There were normal bowel sounds. GU: Deferred  MUSCULOSKELETAL:  Normal muscle strength and tone in all four extremities.    SKIN: Skin turgor is normal. There are no pathologic skin lesions.  NEUROLOGIC:  Motor and sensation is grossly normal.  Cranial nerves are grossly intact. PSYCH:  Alert and oriented to person, place and time. Affect is normal.  Data Reviewed Images and labs reviewed my myself. CT scan with dilated loops of small bowel in left lower abdomen with a possible transition point consistent with small bowel obstruction. Labs with a leukocytosis, but otherwise unremarkable.  I have personally reviewed the patient's imaging and medical records.    Assessment    81 year old female with a small bowel obstruction  Plan    Will admit to ward Keep NPO, if persistent nausea/vomiting discussed with patient that she would have an NGT placed IVF and IV medications  Face-to-face time spent with the patient and care providers was 30 minutes, with more than 50% of the time  spent counseling, educating, and coordinating care of the patient.     [name] 10/11/2014, 4:17 PM

## 2014-10-12 DIAGNOSIS — K565 Intestinal adhesions [bands] with obstruction (postprocedural) (postinfection): Secondary | ICD-10-CM

## 2014-10-12 LAB — CBC WITH DIFFERENTIAL/PLATELET
BASOS ABS: 0 10*3/uL (ref 0–0.1)
BASOS PCT: 0 %
EOS ABS: 0.1 10*3/uL (ref 0–0.7)
EOS PCT: 1 %
HCT: 33 % — ABNORMAL LOW (ref 35.0–47.0)
Hemoglobin: 11 g/dL — ABNORMAL LOW (ref 12.0–16.0)
Lymphocytes Relative: 17 %
Lymphs Abs: 1.4 10*3/uL (ref 1.0–3.6)
MCH: 33.6 pg (ref 26.0–34.0)
MCHC: 33.3 g/dL (ref 32.0–36.0)
MCV: 100.8 fL — AB (ref 80.0–100.0)
MONO ABS: 0.8 10*3/uL (ref 0.2–0.9)
Monocytes Relative: 10 %
Neutro Abs: 5.9 10*3/uL (ref 1.4–6.5)
Neutrophils Relative %: 72 %
PLATELETS: 135 10*3/uL — AB (ref 150–440)
RBC: 3.28 MIL/uL — ABNORMAL LOW (ref 3.80–5.20)
RDW: 14.5 % (ref 11.5–14.5)
WBC: 8.2 10*3/uL (ref 3.6–11.0)

## 2014-10-12 LAB — BASIC METABOLIC PANEL
Anion gap: 4 — ABNORMAL LOW (ref 5–15)
BUN: 22 mg/dL — ABNORMAL HIGH (ref 6–20)
CALCIUM: 8.2 mg/dL — AB (ref 8.9–10.3)
CO2: 31 mmol/L (ref 22–32)
CREATININE: 0.86 mg/dL (ref 0.44–1.00)
Chloride: 105 mmol/L (ref 101–111)
GFR calc non Af Amer: 60 mL/min — ABNORMAL LOW (ref >60)
Glucose, Bld: 129 mg/dL — ABNORMAL HIGH (ref 65–99)
Potassium: 4.8 mmol/L (ref 3.5–5.1)
SODIUM: 140 mmol/L (ref 135–145)

## 2014-10-12 LAB — MAGNESIUM: MAGNESIUM: 1.9 mg/dL (ref 1.7–2.4)

## 2014-10-12 LAB — PHOSPHORUS: PHOSPHORUS: 3.4 mg/dL (ref 2.5–4.6)

## 2014-10-12 MED ORDER — PNEUMOCOCCAL VAC POLYVALENT 25 MCG/0.5ML IJ INJ: 0.5000 mL | INJECTION | INTRAMUSCULAR | Status: AC

## 2014-10-12 NOTE — Progress Notes (Signed)
Subjective: 81 year old female admitted for recurrent SBO. She reports no nausea/vomitting since admission. Does report continued bloating and a lack of flatus. She is not hungry. Pain she was having is much improved, but tenderness remains in LLQ.  Vital signs in last 24 hours: Temp:  [97.8 F (36.6 C)-98.9 F (37.2 C)] 97.8 F (36.6 C) (08/17 0805) Pulse Rate:  [69-95] 69 (08/17 0805) Resp:  [13-24] 18 (08/17 0805) BP: (90-136)/(41-87) 95/53 mmHg (08/17 0805) SpO2:  [89 %-100 %] 96 % (08/17 0900) Weight:  [72.576 kg (160 lb)] 72.576 kg (160 lb) (08/16 1102) Last BM Date: 10/10/14  Intake/Output from previous day: 08/16 0701 - 08/17 0700 In: 706 [I.V.:706] Out: 350 [Urine:350]  Exam: GEN: NAD Resp: CTA CV: RRR ABD: Soft, TTP to deep palpation in LLQ, hernia in RLQ remains soft and easily reducible.  Lab Results:  CBC  Recent Labs  10/11/14 1120 10/12/14 0551  WBC 15.6* 8.2  HGB 12.9 11.0*  HCT 39.5 33.0*  PLT 168 135*   CMP     Component Value Date/Time   NA 140 10/12/2014 0551   NA 134* 05/22/2012 0412   K 4.8 10/12/2014 0551   K 4.5 05/22/2012 0412   CL 105 10/12/2014 0551   CL 102 05/22/2012 0412   CO2 31 10/12/2014 0551   CO2 26 05/22/2012 0412   GLUCOSE 129* 10/12/2014 0551   GLUCOSE 141* 05/22/2012 0412   BUN 22* 10/12/2014 0551   BUN 60* 05/22/2012 0412   CREATININE 0.86 10/12/2014 0551   CREATININE 1.26 05/22/2012 0412   CALCIUM 8.2* 10/12/2014 0551   CALCIUM 8.6 05/22/2012 0412   PROT 7.6 10/11/2014 1120   PROT 7.3 05/22/2012 0412   ALBUMIN 3.9 10/11/2014 1120   ALBUMIN 3.1* 05/22/2012 0412   AST 31 10/11/2014 1120   AST 37 05/22/2012 0412   ALT 23 10/11/2014 1120   ALT 24 05/22/2012 0412   ALKPHOS 60 10/11/2014 1120   ALKPHOS 74 05/22/2012 0412   BILITOT 0.6 10/11/2014 1120   BILITOT 0.5 05/22/2012 0412   GFRNONAA 60* 10/12/2014 0551   GFRNONAA 40* 05/22/2012 0412   GFRAA >60 10/12/2014 0551   GFRAA 46* 05/22/2012 0412   PT/INR No  results for input(s): LABPROT, INR in the last 72 hours.  Studies/Results: Ct Abdomen Pelvis W Contrast  10/11/2014   CLINICAL DATA:  Nausea and vomiting with abdominal pain starting this morning. History of bowel obstructions. Ulcerative colitis with prior colon resection.  EXAM: CT ABDOMEN AND PELVIS WITH CONTRAST  TECHNIQUE: Multidetector CT imaging of the abdomen and pelvis was performed using the standard protocol following bolus administration of intravenous contrast.  CONTRAST:  OMNIPAQUE IOHEXOL 300 MG/ML  SOLN  COMPARISON:  Multiple exams, including 07/28/2014  FINDINGS: Lower chest: Bandlike and nodular opacities in the right middle lobe have an inflammatory appearance on images 1 through 7 of series 4. 0.7 by 0.6 cm left lower lobe nodule, image 5 series 4, previously 0.8 by 0.5 cm on 06/04/2009. Various leads project over the atrial appendage and right heart. Old rib deformities noted from prior fractures.  Hepatobiliary: Mild intrahepatic biliary dilatation. Common bile duct 1.1 cm diameter. Gallbladder absent.  Pancreas: Atrophic.  Spleen: Unremarkable  Adrenals/Urinary Tract: Stable fluid density lesions the right kidney, largest 2.0 cm in the lower pole, compatible with cysts. Smaller nonspecific hypodense lesions in the left kidney are likely cysts as well.  Stomach/Bowel: Distended dome stomach with mildly dilated loops of proximal small bowel. Progressive  dilution of contrast column in the anatomic pelvis. Mildly angulated loops of small bowel in the left lower quadrant as on images 47 through 51 of series 2 including in the vicinity of apparent transition in caliber on image 50 of series 2, raising the possibility of adhesions. Proximal small bowel loops up to 4 cm. Distal small bowel loops are nondistended.  Prominence of stool in the colon. Rectosigmoid anastomotic staple line observed.  Vascular/Lymphatic: Aortoiliac atherosclerotic vascular disease.  Reproductive: Uterus absent.   Ovaries not well seen.  Other: No supplemental non-categorized findings.  Musculoskeletal: Right paracentral ventral hernia contains nondilated loops of small bowel as on image 50 series 2. Hernia mesh is present in the upper abdomen along the midline. To the left of this are several ventral herniations of omental adipose tissue, as on images 27-41 of series 2.  As before, there is prominent lumbar spondylosis and degenerative disc disease along with a chronic appearing superior endplate compression fracture at L2. Probable impingement at the L3-4 level.  IMPRESSION: 1. Mild small bowel obstruction, with angulated loop of small bowel in the left lower quadrant anteriorly at the lead point, suspicious for adhesion. 2. Mild prominence of stool in the colon may reflect constipation or functional colonic abnormality. 3. Several hernias are present including a right lower quadrant ventral hernia containing loops of small bowel and left upper abdominal paramedian herniations of omental adipose tissue. None of these appear to be contributing to the obstruction. 4. Chronic intrahepatic and extrahepatic biliary dilatation. Although potentially related to the cholecystectomy, other causes of chronic biliary dilatation are not excluded. 5. Lumbar spondylosis and degenerative disc disease, with suspected impingement at the L3-4 level. 6. Chronic left lower lobe pulmonary nodule is stable for years and considered benign. Clustered nodularity in the right middle lobe appears inflammatory, likely from atypical infectious process.   Electronically Signed   By: Gaylyn Rong M.D.   On: 10/11/2014 14:19   Dg Chest Port 1 View  10/11/2014   CLINICAL DATA:  Nausea, vomiting since this morning. History of bowel obstruction. Low O2 sats  EXAM: PORTABLE CHEST - 1 VIEW  COMPARISON:  08/26/2011  FINDINGS: Right pacer remains in place, unchanged. Heart is normal size. Mediastinal contours are within normal limits. No confluent  airspace opacity or effusion. No acute bony abnormality.  IMPRESSION: No active disease.   Electronically Signed   By: Charlett Nose M.D.   On: 10/11/2014 11:53    Assessment/Plan: 81 year old female with SBO. Appears to be resolving. WIll continue NPO. Should Nausea or vomiting return will place NGT Continue IVF Will possibly give trial of clears later today   [name], MD [facility] General Surgeon [facility]

## 2014-10-13 LAB — CBC WITH DIFFERENTIAL/PLATELET
Basophils Absolute: 0 10*3/uL (ref 0–0.1)
Basophils Relative: 0 %
EOS ABS: 0.1 10*3/uL (ref 0–0.7)
EOS PCT: 1 %
HCT: 30.9 % — ABNORMAL LOW (ref 35.0–47.0)
Hemoglobin: 10.3 g/dL — ABNORMAL LOW (ref 12.0–16.0)
LYMPHS ABS: 1.3 10*3/uL (ref 1.0–3.6)
Lymphocytes Relative: 22 %
MCH: 33.7 pg (ref 26.0–34.0)
MCHC: 33.3 g/dL (ref 32.0–36.0)
MCV: 101.1 fL — ABNORMAL HIGH (ref 80.0–100.0)
Monocytes Absolute: 0.7 10*3/uL (ref 0.2–0.9)
Monocytes Relative: 11 %
Neutro Abs: 3.9 10*3/uL (ref 1.4–6.5)
Neutrophils Relative %: 66 %
PLATELETS: 129 10*3/uL — AB (ref 150–440)
RBC: 3.06 MIL/uL — AB (ref 3.80–5.20)
RDW: 13.8 % (ref 11.5–14.5)
WBC: 6 10*3/uL (ref 3.6–11.0)

## 2014-10-13 LAB — BASIC METABOLIC PANEL
ANION GAP: 6 (ref 5–15)
BUN: 12 mg/dL (ref 6–20)
CALCIUM: 8.1 mg/dL — AB (ref 8.9–10.3)
CO2: 26 mmol/L (ref 22–32)
Chloride: 107 mmol/L (ref 101–111)
Creatinine, Ser: 0.63 mg/dL (ref 0.44–1.00)
GLUCOSE: 117 mg/dL — AB (ref 65–99)
Potassium: 4.3 mmol/L (ref 3.5–5.1)
Sodium: 139 mmol/L (ref 135–145)

## 2014-10-13 LAB — MAGNESIUM: Magnesium: 1.9 mg/dL (ref 1.7–2.4)

## 2014-10-13 LAB — PHOSPHORUS: Phosphorus: 2.5 mg/dL (ref 2.5–4.6)

## 2014-10-13 NOTE — Discharge Instructions (Signed)

## 2014-10-13 NOTE — Discharge Summary (Signed)
Patient ID: [name] MRN: [id number] DOB/AGE: [birth year] 79 y.o.  Admit date: 10/11/2014 Discharge date: 10/13/2014  Discharge Diagnoses:  Small bowel obstruction  Procedures Performed: None   Discharged Condition: good  Hospital Course: Admitted from ER with small bowel obstruction. Resolved with bowel rest. Able to tolerate diet and have bowel function prior to discharge  Discharge Orders: Discharged to home; resume normal diet, activity and medications  Disposition: 01-Home or Self Care  Discharge Medications:  Current facility-administered medications:  .  albuterol (PROVENTIL) (2.5 MG/3ML) 0.083% nebulizer solution 3 mL, 3 mL, Inhalation, BID, [name], MD, 3 mL at 10/13/14 0829 .  amitriptyline (ELAVIL) tablet 75 mg, 75 mg, Oral, QHS, [name], MD, 75 mg at 10/12/14 2115 .  digoxin (LANOXIN) tablet 0.125 mg, 0.125 mg, Oral, Daily, [name], MD, 0.125 mg at 10/13/14 1010 .  losartan (COZAAR) tablet 25 mg, 25 mg, Oral, Daily, [name], MD, 25 mg at 10/13/14 1009 .  mometasone-formoterol (DULERA) 100-5 MCG/ACT inhaler 2 puff, 2 puff, Inhalation, BID, [name], MD, 2 puff at 10/13/14 1014 .  tiotropium [facility]) inhalation capsule 18 mcg, 18 mcg, Inhalation, Daily, [name], MD, 18 mcg at 10/13/14 1014  Follwup: Follow-up Information    Call [facility].   Why:  As needed   Contact information:   [address] [place] [address] [phone]      Signed: [name] 10/13/2014, 2:04 PM

## 2014-10-13 NOTE — Care Management Important Message (Signed)
Important Message  Patient Details  Name: Ja LAURAANNE EYMARD MRN: [id number] Date of Birth: [birth year]   Medicare Important Message Given:  Yes-second notification given    Verita Schneiders Allmond 10/13/2014, 10:47 AM

## 2014-10-13 NOTE — Progress Notes (Signed)
CC: SBO Subjective: 81 year old female admitted for SBO. Has tolerated clear liquids overnight and states that she is hungry this morning and wants "real food". Denies any pain, nausea or vomiting and has continued to pass flatus and had a BM yesterday.  Objective: Vital signs in last 24 hours: Temp:  [97.9 F (36.6 C)-98.4 F (36.9 C)] 98.1 F (36.7 C) (08/18 0756) Pulse Rate:  [63-97] 68 (08/18 0756) Resp:  [16-18] 18 (08/18 0756) BP: (103-119)/(46-60) 118/60 mmHg (08/18 0756) SpO2:  [91 %-96 %] 94 % (08/18 0831) Last BM Date: 10/12/14  Intake/Output from previous day: 08/17 0701 - 08/18 0700 In: 3163 [P.O.:800; I.V.:2363] Out: 2250 [Urine:2250] Intake/Output this shift:    Physical exam:  GEN: NAD CHEST: CTA and RRR ABD: Soft, NT, ND. Hernia in RLQ remains soft and easily reducible.  Lab Results: CBC   Recent Labs  10/12/14 0551 10/13/14 0555  WBC 8.2 6.0  HGB 11.0* 10.3*  HCT 33.0* 30.9*  PLT 135* 129*   BMET  Recent Labs  10/12/14 0551 10/13/14 0555  NA 140 139  K 4.8 4.3  CL 105 107  CO2 31 26  GLUCOSE 129* 117*  BUN 22* 12  CREATININE 0.86 0.63  CALCIUM 8.2* 8.1*   PT/INR No results for input(s): LABPROT, INR in the last 72 hours. ABG No results for input(s): PHART, HCO3 in the last 72 hours.  Invalid input(s): PCO2, PO2  Studies/Results: Ct Abdomen Pelvis W Contrast  10/11/2014   CLINICAL DATA:  Nausea and vomiting with abdominal pain starting this morning. History of bowel obstructions. Ulcerative colitis with prior colon resection.  EXAM: CT ABDOMEN AND PELVIS WITH CONTRAST  TECHNIQUE: Multidetector CT imaging of the abdomen and pelvis was performed using the standard protocol following bolus administration of intravenous contrast.  CONTRAST:  OMNIPAQUE IOHEXOL 300 MG/ML  SOLN  COMPARISON:  Multiple exams, including 07/28/2014  FINDINGS: Lower chest: Bandlike and nodular opacities in the right middle lobe have an inflammatory appearance  on images 1 through 7 of series 4. 0.7 by 0.6 cm left lower lobe nodule, image 5 series 4, previously 0.8 by 0.5 cm on 06/04/2009. Various leads project over the atrial appendage and right heart. Old rib deformities noted from prior fractures.  Hepatobiliary: Mild intrahepatic biliary dilatation. Common bile duct 1.1 cm diameter. Gallbladder absent.  Pancreas: Atrophic.  Spleen: Unremarkable  Adrenals/Urinary Tract: Stable fluid density lesions the right kidney, largest 2.0 cm in the lower pole, compatible with cysts. Smaller nonspecific hypodense lesions in the left kidney are likely cysts as well.  Stomach/Bowel: Distended dome stomach with mildly dilated loops of proximal small bowel. Progressive dilution of contrast column in the anatomic pelvis. Mildly angulated loops of small bowel in the left lower quadrant as on images 47 through 51 of series 2 including in the vicinity of apparent transition in caliber on image 50 of series 2, raising the possibility of adhesions. Proximal small bowel loops up to 4 cm. Distal small bowel loops are nondistended.  Prominence of stool in the colon. Rectosigmoid anastomotic staple line observed.  Vascular/Lymphatic: Aortoiliac atherosclerotic vascular disease.  Reproductive: Uterus absent.  Ovaries not well seen.  Other: No supplemental non-categorized findings.  Musculoskeletal: Right paracentral ventral hernia contains nondilated loops of small bowel as on image 50 series 2. Hernia mesh is present in the upper abdomen along the midline. To the left of this are several ventral herniations of omental adipose tissue, as on images 27-41 of series 2.  As before, there is prominent lumbar spondylosis and degenerative disc disease along with a chronic appearing superior endplate compression fracture at L2. Probable impingement at the L3-4 level.  IMPRESSION: 1. Mild small bowel obstruction, with angulated loop of small bowel in the left lower quadrant anteriorly at the lead point,  suspicious for adhesion. 2. Mild prominence of stool in the colon may reflect constipation or functional colonic abnormality. 3. Several hernias are present including a right lower quadrant ventral hernia containing loops of small bowel and left upper abdominal paramedian herniations of omental adipose tissue. None of these appear to be contributing to the obstruction. 4. Chronic intrahepatic and extrahepatic biliary dilatation. Although potentially related to the cholecystectomy, other causes of chronic biliary dilatation are not excluded. 5. Lumbar spondylosis and degenerative disc disease, with suspected impingement at the L3-4 level. 6. Chronic left lower lobe pulmonary nodule is stable for years and considered benign. Clustered nodularity in the right middle lobe appears inflammatory, likely from atypical infectious process.   Electronically Signed   By: Gaylyn Rong M.D.   On: 10/11/2014 14:19   Dg Chest Port 1 View  10/11/2014   CLINICAL DATA:  Nausea, vomiting since this morning. History of bowel obstruction. Low O2 sats  EXAM: PORTABLE CHEST - 1 VIEW  COMPARISON:  08/26/2011  FINDINGS: Right pacer remains in place, unchanged. Heart is normal size. Mediastinal contours are within normal limits. No confluent airspace opacity or effusion. No acute bony abnormality.  IMPRESSION: No active disease.   Electronically Signed   By: Charlett Nose M.D.   On: 10/11/2014 11:53    Anti-infectives: Anti-infectives    None      Assessment/Plan:  81 y/o female with resolving small bowel obstruction Will advance diet, encourage ambulation If tolerates regular diet potential discharge later today  Omaria Plunk T. Tonita Cong, MD, FACS  10/13/2014

## 2014-10-13 NOTE — Care Management (Signed)
Spoke with patient and son at bedside. From home with family support. Has walker at home. No other DME. Son stated no needs at this time .

## 2015-05-05 ENCOUNTER — Other Ambulatory Visit: Payer: Self-pay | Admitting: Otolaryngology

## 2015-05-05 DIAGNOSIS — R1310 Dysphagia, unspecified: Secondary | ICD-10-CM

## 2015-05-08 ENCOUNTER — Encounter
Admission: RE | Admit: 2015-05-08 | Discharge: 2015-05-08 | Disposition: A | Discharge status: Home / Self Care | Payer: Medicare Other | Source: Ambulatory Visit | Attending: Cardiology | Admitting: Cardiology

## 2015-05-08 DIAGNOSIS — Z01818 Encounter for other preprocedural examination: Secondary | ICD-10-CM | POA: Insufficient documentation

## 2015-05-08 DIAGNOSIS — R1310 Dysphagia, unspecified: Secondary | ICD-10-CM | POA: Insufficient documentation

## 2015-05-08 HISTORY — DX: Personal history of other diseases of the digestive system: Z87.19

## 2015-05-08 HISTORY — DX: Anemia, unspecified: D64.9

## 2015-05-08 HISTORY — DX: Emphysema, unspecified: J43.9

## 2015-05-08 HISTORY — DX: Diverticulosis of intestine, part unspecified, without perforation or abscess without bleeding: K57.90

## 2015-05-08 HISTORY — DX: Presence of automatic (implantable) cardiac defibrillator: Z95.810

## 2015-05-08 HISTORY — DX: Restless legs syndrome: G25.81

## 2015-05-08 HISTORY — DX: Presence of cardiac pacemaker: Z95.0

## 2015-05-08 HISTORY — DX: Reserved for inherently not codable concepts without codable children: IMO0001

## 2015-05-08 HISTORY — DX: Unspecified osteoarthritis, unspecified site: M19.90

## 2015-05-08 HISTORY — DX: Gastro-esophageal reflux disease without esophagitis: K21.9

## 2015-05-08 LAB — DIFFERENTIAL
BASOS PCT: 1 %
Basophils Absolute: 0.1 10*3/uL (ref 0–0.1)
EOS ABS: 0.1 10*3/uL (ref 0–0.7)
EOS PCT: 2 %
LYMPHS PCT: 36 %
Lymphs Abs: 2.3 10*3/uL (ref 1.0–3.6)
MONO ABS: 0.6 10*3/uL (ref 0.2–0.9)
Monocytes Relative: 9 %
NEUTROS PCT: 52 %
Neutro Abs: 3.3 10*3/uL (ref 1.4–6.5)

## 2015-05-08 LAB — URINALYSIS COMPLETE WITH MICROSCOPIC (ARMC ONLY)
BILIRUBIN URINE: NEGATIVE
Bacteria, UA: NONE SEEN
GLUCOSE, UA: NEGATIVE mg/dL
Ketones, ur: NEGATIVE mg/dL
NITRITE: NEGATIVE
Protein, ur: NEGATIVE mg/dL
RBC / HPF: NONE SEEN RBC/hpf (ref 0–5)
SPECIFIC GRAVITY, URINE: 1.008 (ref 1.005–1.030)
pH: 7 (ref 5.0–8.0)

## 2015-05-08 LAB — SURGICAL PCR SCREEN
MRSA, PCR: NEGATIVE
Staphylococcus aureus: NEGATIVE

## 2015-05-08 LAB — BASIC METABOLIC PANEL
Anion gap: 8 (ref 5–15)
BUN: 34 mg/dL — AB (ref 6–20)
CALCIUM: 9.5 mg/dL (ref 8.9–10.3)
CHLORIDE: 100 mmol/L — AB (ref 101–111)
CO2: 29 mmol/L (ref 22–32)
CREATININE: 0.92 mg/dL (ref 0.44–1.00)
GFR calc non Af Amer: 55 mL/min — ABNORMAL LOW (ref >60)
Glucose, Bld: 100 mg/dL — ABNORMAL HIGH (ref 65–99)
Potassium: 4.1 mmol/L (ref 3.5–5.1)
SODIUM: 137 mmol/L (ref 135–145)

## 2015-05-08 LAB — CBC
HCT: 39.1 % (ref 35.0–47.0)
Hemoglobin: 13 g/dL (ref 12.0–16.0)
MCH: 33.1 pg (ref 26.0–34.0)
MCHC: 33.2 g/dL (ref 32.0–36.0)
MCV: 99.5 fL (ref 80.0–100.0)
PLATELETS: 180 10*3/uL (ref 150–440)
RBC: 3.92 MIL/uL (ref 3.80–5.20)
RDW: 14.6 % — AB (ref 11.5–14.5)
WBC: 6.4 10*3/uL (ref 3.6–11.0)

## 2015-05-08 LAB — PROTIME-INR
INR: 0.97
PROTHROMBIN TIME: 13.1 s (ref 11.4–15.0)

## 2015-05-08 LAB — APTT: aPTT: 29 seconds (ref 24–36)

## 2015-05-08 NOTE — Patient Instructions (Signed)
  Your procedure is scheduled on: May 19, 2015 (Friday) Report to Day Surgery.[facility]) Second Floor To find out your arrival time please call [phone] between 1PM - 3PM on May 18, 2915 (Thursday).  Remember: Instructions that are not followed completely may result in serious medical risk, up to and including death, or upon the discretion of your surgeon and anesthesiologist your surgery may need to be rescheduled.    __x__ 1. Do not eat food or drink liquids after midnight. No gum chewing or hard candies.     __x__ 2. No Alcohol for 24 hours before or after surgery.   ____ 3. Bring all medications with you on the day of surgery if instructed.    __x__ 4. Notify your doctor if there is any change in your medical condition     (cold, fever, infections).     Do not wear jewelry, make-up, hairpins, clips or nail polish.  Do not wear lotions, powders, or perfumes. You may wear deodorant.  Do not shave 48 hours prior to surgery. Men may shave face and neck.  Do not bring valuables to the hospital.    [facility] is not responsible for any belongings or valuables.               Contacts, dentures or bridgework may not be worn into surgery.  Leave your suitcase in the car. After surgery it may be brought to your room.  For patients admitted to the hospital, discharge time is determined by your                treatment team.   Patients discharged the day of surgery will not be allowed to drive home.   Please read over the following fact sheets that you were given:   MRSA Information and Surgical Site Infection Prevention   ____ Take these medicines the morning of surgery with A SIP OF WATER:    1. TAKE ALL MEDICATIONS AS INSTRUCTED BY YOUR CARDIOLOGIST THE MORNING OF SURGERY   2.   3.   4.  5.  6.  ____ Fleet Enema (as directed)   ____ Use CHG Soap as directed  _x___ Use inhalers on the day of surgery  (use inhalers the morning of surgery)   ____ Stop metformin 2  days prior to surgery    ____ Take 1/2 of usual insulin dose the night before surgery and none on the morning of surgery.   ____ Stop Coumadin/Plavix/aspirin on   __x_ Stop Anti-inflammatories on (NO NSAIDS) Tylenol ok to take for pain if needed   ____ Stop supplements until after surgery.    ____ Bring C-Pap to the hospital.

## 2015-05-26 ENCOUNTER — Encounter
Admission: RE | Disposition: A | Discharge status: Home / Self Care | Payer: Self-pay | Source: Ambulatory Visit | Attending: Cardiology

## 2015-05-26 ENCOUNTER — Encounter: Payer: Self-pay | Admitting: *Deleted

## 2015-05-26 ENCOUNTER — Ambulatory Visit
Admission: RE | Admit: 2015-05-26 | Discharge: 2015-05-26 | Disposition: A | Discharge status: Home / Self Care | Payer: Medicare Other | Source: Ambulatory Visit | Attending: Cardiology | Admitting: Cardiology

## 2015-05-26 ENCOUNTER — Ambulatory Visit: Payer: Medicare Other | Admitting: Certified Registered Nurse Anesthetist

## 2015-05-26 ENCOUNTER — Other Ambulatory Visit: Payer: Self-pay | Admitting: Cardiology

## 2015-05-26 DIAGNOSIS — Z8711 Personal history of peptic ulcer disease: Secondary | ICD-10-CM | POA: Insufficient documentation

## 2015-05-26 DIAGNOSIS — R062 Wheezing: Secondary | ICD-10-CM | POA: Diagnosis not present

## 2015-05-26 DIAGNOSIS — Z9049 Acquired absence of other specified parts of digestive tract: Secondary | ICD-10-CM | POA: Insufficient documentation

## 2015-05-26 DIAGNOSIS — Z9849 Cataract extraction status, unspecified eye: Secondary | ICD-10-CM | POA: Diagnosis not present

## 2015-05-26 DIAGNOSIS — Z7951 Long term (current) use of inhaled steroids: Secondary | ICD-10-CM | POA: Diagnosis not present

## 2015-05-26 DIAGNOSIS — Z8611 Personal history of tuberculosis: Secondary | ICD-10-CM | POA: Diagnosis not present

## 2015-05-26 DIAGNOSIS — Z90710 Acquired absence of both cervix and uterus: Secondary | ICD-10-CM | POA: Insufficient documentation

## 2015-05-26 DIAGNOSIS — G2581 Restless legs syndrome: Secondary | ICD-10-CM | POA: Diagnosis not present

## 2015-05-26 DIAGNOSIS — Z45018 Encounter for adjustment and management of other part of cardiac pacemaker: Secondary | ICD-10-CM | POA: Diagnosis present

## 2015-05-26 DIAGNOSIS — I255 Ischemic cardiomyopathy: Secondary | ICD-10-CM | POA: Diagnosis not present

## 2015-05-26 DIAGNOSIS — K589 Irritable bowel syndrome without diarrhea: Secondary | ICD-10-CM | POA: Diagnosis not present

## 2015-05-26 DIAGNOSIS — J449 Chronic obstructive pulmonary disease, unspecified: Secondary | ICD-10-CM | POA: Insufficient documentation

## 2015-05-26 DIAGNOSIS — Z79899 Other long term (current) drug therapy: Secondary | ICD-10-CM | POA: Diagnosis not present

## 2015-05-26 DIAGNOSIS — I447 Left bundle-branch block, unspecified: Secondary | ICD-10-CM | POA: Insufficient documentation

## 2015-05-26 DIAGNOSIS — I5022 Chronic systolic (congestive) heart failure: Secondary | ICD-10-CM | POA: Insufficient documentation

## 2015-05-26 DIAGNOSIS — Z7982 Long term (current) use of aspirin: Secondary | ICD-10-CM | POA: Diagnosis not present

## 2015-05-26 DIAGNOSIS — Z87891 Personal history of nicotine dependence: Secondary | ICD-10-CM | POA: Diagnosis not present

## 2015-05-26 HISTORY — PX: IMPLANTABLE CARDIOVERTER DEFIBRILLATOR (ICD) GENERATOR CHANGE: SHX5469

## 2015-05-26 SURGERY — ICD GENERATOR CHANGE
Anesthesia: Monitor Anesthesia Care | Wound class: Clean

## 2015-05-26 MED ORDER — PROPOFOL 500 MG/50ML IV EMUL
INTRAVENOUS | Status: DC | PRN
  Administered 2015-05-26: 35 ug/kg/min via INTRAVENOUS

## 2015-05-26 MED ORDER — PHENYLEPHRINE HCL 10 MG/ML IJ SOLN
INTRAMUSCULAR | Status: DC | PRN
  Administered 2015-05-26 (×3): 100 ug via INTRAVENOUS

## 2015-05-26 MED ORDER — LIDOCAINE HCL (PF) 1 % IJ SOLN
INTRAMUSCULAR | Status: AC
  Filled 2015-05-26: qty 30

## 2015-05-26 MED ORDER — LIDOCAINE HCL (CARDIAC) 20 MG/ML IV SOLN
INTRAVENOUS | Status: DC | PRN
  Administered 2015-05-26: 60 mg via INTRAVENOUS

## 2015-05-26 MED ORDER — LIDOCAINE 1 % OPTIME INJ - NO CHARGE
INTRAMUSCULAR | Status: DC | PRN
  Administered 2015-05-26: 17 mL

## 2015-05-26 MED ORDER — SODIUM CHLORIDE 0.9 % IV SOLN
INTRAVENOUS | Status: DC
  Administered 2015-05-26: 12:00:00 via INTRAVENOUS

## 2015-05-26 MED ORDER — FENTANYL CITRATE (PF) 100 MCG/2ML IJ SOLN
INTRAMUSCULAR | Status: DC | PRN
  Administered 2015-05-26: 50 ug via INTRAVENOUS
  Administered 2015-05-26 (×2): 25 ug via INTRAVENOUS

## 2015-05-26 MED ORDER — SODIUM CHLORIDE 0.9 % IR SOLN
Freq: Once | Status: DC
  Filled 2015-05-26: qty 2

## 2015-05-26 MED ORDER — CEFAZOLIN SODIUM-DEXTROSE 2-4 GM/100ML-% IV SOLN
2.0000 g | Freq: Once | INTRAVENOUS | Status: AC
  Administered 2015-05-26: 2 g via INTRAVENOUS

## 2015-05-26 MED ORDER — SODIUM CHLORIDE 0.9 % IR SOLN
Status: DC | PRN
  Administered 2015-05-26: 200 mL

## 2015-05-26 SURGICAL SUPPLY — 46 items
BAG DECANTER FOR FLEXI CONT (MISCELLANEOUS) ×3 IMPLANT
BLADE SURG SZ10 CARB STEEL (BLADE) ×3 IMPLANT
CABLE SURG 12 DISP A/V CHANNEL (MISCELLANEOUS) ×3 IMPLANT
CANISTER SUCT 1200ML W/VALVE (MISCELLANEOUS) ×3 IMPLANT
CHLORAPREP W/TINT 26ML (MISCELLANEOUS) ×3 IMPLANT
CLOSURE WOUND 1/2 X4 (GAUZE/BANDAGES/DRESSINGS) ×1
COVER LIGHT HANDLE STERIS (MISCELLANEOUS) ×6 IMPLANT
DRAPE C-ARM XRAY 36X54 (DRAPES) ×3 IMPLANT
DRAPE CAMERA CLOSED 9X96 (DRAPES) ×3 IMPLANT
DRAPE LAPAROTOMY 77X122 PED (DRAPES) ×3 IMPLANT
DRSG TEGADERM 4X4.75 (GAUZE/BANDAGES/DRESSINGS) ×3 IMPLANT
DRSG TEGADERM 6X8 (GAUZE/BANDAGES/DRESSINGS) ×3 IMPLANT
ELECT REM PT RETURN 9FT ADLT (ELECTROSURGICAL) ×3
ELECTRODE REM PT RTRN 9FT ADLT (ELECTROSURGICAL) ×1 IMPLANT
GLOVE BIO SURGEON STRL SZ7.5 (GLOVE) ×3 IMPLANT
GOWN STRL REUS W/ TWL LRG LVL3 (GOWN DISPOSABLE) ×1 IMPLANT
GOWN STRL REUS W/ TWL XL LVL3 (GOWN DISPOSABLE) ×1 IMPLANT
GOWN STRL REUS W/TWL LRG LVL3 (GOWN DISPOSABLE) ×2
GOWN STRL REUS W/TWL XL LVL3 (GOWN DISPOSABLE) ×2
GRADUATE 1200CC STRL 31836 (MISCELLANEOUS) ×3 IMPLANT
IV NS 1000ML (IV SOLUTION) ×2
IV NS 1000ML BAXH (IV SOLUTION) ×1 IMPLANT
KIT RM TURNOVER STRD PROC AR (KITS) ×3 IMPLANT
NEEDLE FILTER BLUNT 18X 1/2SAF (NEEDLE) ×2
NEEDLE FILTER BLUNT 18X1 1/2 (NEEDLE) ×1 IMPLANT
NEEDLE HYPO 25X1 1.5 SAFETY (NEEDLE) ×3 IMPLANT
NEEDLE SPNL 22GX3.5 QUINCKE BK (NEEDLE) ×3 IMPLANT
NS IRRIG 500ML POUR BTL (IV SOLUTION) ×3 IMPLANT
PACEMAKER VIVA CRT-P (Pacemaker) ×3 IMPLANT
PACK BASIN MINOR ARMC (MISCELLANEOUS) ×3 IMPLANT
PAD STATPAD (MISCELLANEOUS) ×3 IMPLANT
SPONGE XRAY 4X4 16PLY STRL (MISCELLANEOUS) ×3 IMPLANT
STRIP CLOSURE SKIN 1/2X4 (GAUZE/BANDAGES/DRESSINGS) ×2 IMPLANT
SUT DVC V-LOC 4-0 90 CLR P-12 (SUTURE) ×3
SUT DVC VLOC 3-0 CL 6 P-12 (SUTURE) ×3 IMPLANT
SUT SILK 0 SH 30 (SUTURE) ×3 IMPLANT
SUT SILK 2 0 SH (SUTURE) ×3 IMPLANT
SUT VIC AB 2-0 CT1 27 (SUTURE) ×2
SUT VIC AB 2-0 CT1 TAPERPNT 27 (SUTURE) ×1 IMPLANT
SUT VIC AB 2-0 CT2 27 (SUTURE) ×3 IMPLANT
SUT VIC AB 3-0 PS2 18 (SUTURE) ×3 IMPLANT
SUT VIC AB 4-0 PS2 18 (SUTURE) ×3 IMPLANT
SUTURE DVC V-LC4-0 90 CLR P-12 (SUTURE) ×1 IMPLANT
SYR BULB IRRIG 60ML STRL (SYRINGE) ×3 IMPLANT
SYR CONTROL 10ML (SYRINGE) ×3 IMPLANT
SYRINGE 10CC LL (SYRINGE) ×3 IMPLANT

## 2015-05-26 NOTE — Transfer of Care (Signed)
Immediate Anesthesia Transfer of Care Note  Patient: [name]  Procedure(s) Performed: Procedure(s): ICD GENERATOR CHANGE (N/A)  Patient Location: PACU  Anesthesia Type:General  Level of Consciousness: awake, alert  and oriented  Airway & Oxygen Therapy: Patient Spontanous Breathing and Patient connected to face mask oxygen  Post-op Assessment: Report given to RN and Post -op Vital signs reviewed and stable  Post vital signs: Reviewed and stable  Last Vitals:  Filed Vitals:   05/26/15 1203  BP: 142/72  Pulse: 68  Temp: 35.6 C  Resp: 18    Complications: No apparent anesthesia complications

## 2015-05-26 NOTE — Discharge Instructions (Signed)

## 2015-05-26 NOTE — Anesthesia Preprocedure Evaluation (Signed)
Anesthesia Evaluation  Patient identified by MRN, date of birth, ID band Patient awake    Reviewed: Allergy & Precautions, H&P , NPO status , Patient's Chart, lab work & pertinent test results  History of Anesthesia Complications Negative for: history of anesthetic complications  Airway Mallampati: III  TM Distance: >3 FB Neck ROM: limited    Dental  (+) Poor Dentition, Missing, Upper Dentures, Lower Dentures   Pulmonary asthma , COPD,  COPD inhaler, former smoker,    Pulmonary exam normal breath sounds clear to auscultation       Cardiovascular Exercise Tolerance: Good (-) angina+CHF  (-) Past MI Normal cardiovascular exam+ pacemaker + Cardiac Defibrillator  Rhythm:regular Rate:Normal     Neuro/Psych negative neurological ROS  negative psych ROS   GI/Hepatic Neg liver ROS, PUD, GERD  Controlled,  Endo/Other  negative endocrine ROS  Renal/GU negative Renal ROS  negative genitourinary   Musculoskeletal  (+) Arthritis ,   Abdominal   Peds  Hematology negative hematology ROS (+)   Anesthesia Other Findings Past Medical History:   Asthma                                                       COPD (chronic obstructive pulmonary disease) (*              CHF (congestive heart failure) (HCC)                         Cardiomyopathy (HCC)                            1990         Bacteremia                                      2007           Comment:asystole pacemaker failed to capture due to               lead wire   Atrial fibrillation (HCC)                                      Comment:elected not to take coumadin   Bowel obstruction (HCC)                                        Comment:multiple   TB (tuberculosis)                                              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   Multiple gastric ulcers                                        Comment:in recent years   Ulcerative colitis (HCC)  Comment:2007   Presence of permanent cardiac pacemaker                      AICD (automatic cardioverter/defibrillator) pr*              Emphysema of lung (HCC)                                      Shortness of breath dyspnea                                    Comment:with exertion   Diverticulosis                                               GERD (gastroesophageal reflux disease)                       Arthritis                                                    Anemia                                                       History of IBS                                               Restless leg syndrome                                       Past Surgical History:   PACEMAKER INSERTION                              2003         CHOLECYSTECTOMY                                               COLOSTOMY                                       Left 1992         APPENDECTOMY                                     1990         ABDOMINAL HYSTERECTOMY  1990         VENTRICULAR CARDIAC PACEMAKER INSERTION          2007           Comment:biventricular pacer with defib implanted 2007               concerto [id number] serial [id number]   COLON SURGERY                                                 EYE SURGERY                                     Bilateral                Comment:Cataract Extraction with IOL   CARDIAC CATHETERIZATION                                       COLOSTOMY TAKEDOWN                               1993        BMI    Body Mass Index   32.70 kg/m 2      Reproductive/Obstetrics negative OB ROS                             Anesthesia Physical Anesthesia Plan  ASA: IV  Anesthesia Plan: General and MAC   Post-op Pain Management:    Induction:   Airway Management Planned:   Additional Equipment:   Intra-op Plan:   Post-operative Plan:   Informed Consent: I have reviewed the patients History  and Physical, chart, labs and discussed the procedure including the risks, benefits and alternatives for the proposed anesthesia with the patient or authorized representative who has indicated his/her understanding and acceptance.   Dental Advisory Given  Plan Discussed with: Anesthesiologist, CRNA and Surgeon  Anesthesia Plan Comments:         Anesthesia Quick Evaluation

## 2015-05-26 NOTE — Anesthesia Postprocedure Evaluation (Signed)
Anesthesia Post Note  Patient: [name]  Procedure(s) Performed: Procedure(s) (LRB): ICD GENERATOR CHANGE (N/A)  Patient location during evaluation: PACU Anesthesia Type: General Level of consciousness: awake and alert Pain management: pain level controlled Vital Signs Assessment: post-procedure vital signs reviewed and stable Respiratory status: spontaneous breathing, nonlabored ventilation, respiratory function stable and patient connected to nasal cannula oxygen Cardiovascular status: blood pressure returned to baseline and stable Postop Assessment: no signs of nausea or vomiting Anesthetic complications: no    Last Vitals:  Filed Vitals:   05/26/15 1509 05/26/15 1524  BP: 143/77 143/84  Pulse:  71  Temp:  36.4 C  Resp: 14 17    Last Pain: There were no vitals filed for this visit.               Cleda Mccreedy Trinitie Mcgirr

## 2015-05-26 NOTE — H&P (Signed)
Patient Identification: [name] is a 82 y.o. female here for cardiac device management.  History: [name] is a 82 y.o. female with a history of ischemic cardiomyopathy left bundle branch block prior device infection in 2007 who presents today as her biventricular ICD is approaching ERI. He has stable New York Heart Association class II symptoms she denies any recent hospitalizations is accompanied today by her son who helps assist her with her healthcare decisions, he is a former Engineer, civil (consulting) at [employer] for many years. Distally her device was placed for some traumatic bradycardia back in 2003. In 2007 she was admitted for endocarditis and underwent device extraction. At that time it was noted that her ejection fraction was less than 15% and subsequently had a biventricular ICD placed. In 2013 she had a generator change out and was noted to have a [employer] FiDellis lead. The time of the generator change her lead was capped and a Medtronic single coil RV high-voltage lead was implanted. Device is currently right-sided the prior site with the infection was on the left side. The vendor of her current device is Medtronic  She denies chest pain, chest pressure/discomfort, claudication, dyspnea, exertional chest pressure/discomfort, irregular heart beat, lower extremity edema, near-syncope, orthopnea, paroxysmal nocturnal dyspnea and syncope   Review of symptoms is positive for fatigue dyspnea on exertion palpitations and arthritis in her hands otherwise 12 point review of systems negative    Medications: Current Outpatient Prescriptions  Medication Sig Dispense Refill  . albuterol (PROVENTIL HFA) 90 mcg/actuation inhaler Inhale 2 inhalations into the lungs every 6 (six) hours as needed for Wheezing.  Marland Kitchen amitriptyline (ELAVIL) 75 MG tablet take 1 tablet by mouth at bedtime 30 tablet 11  . aspirin 81 MG EC tablet Take 81 mg by mouth once daily.  . celecoxib (CELEBREX) 200 MG capsule Take 1 capsule (200 mg  total) by mouth 2 (two) times daily. 60 capsule 3  . digoxin (DIGITEK) 0.125 MG tablet Take 1 tablet (0.125 mg total) by mouth once daily. 30 tablet 5  . fluticasone-salmeterol (ADVAIR DISKUS) 500-50 mcg/dose diskus inhaler Inhale 1 inhalation into the lungs every 12 (twelve) hours.  . FUROsemide (LASIX) 20 MG tablet take 2-3 tablets by mouth once daily if needed 90 tablet 11  . losartan (COZAAR) 25 MG tablet Take 1 tablet (25 mg total) by mouth once daily. 30 tablet 5  . magnesium oxide (MAG-OX) 400 mg tablet Take 800 mg by mouth as needed.  Marland Kitchen omeprazole (PRILOSEC) 20 MG DR capsule Take 1 capsule (20 mg total) by mouth every morning. 30 capsule 5  . pantoprazole (PROTONIX) 40 MG DR tablet Take 1 tablet (40 mg total) by mouth once daily. 30 tablet 11  . polyethylene glycol (MIRALAX) powder Take 17 g by mouth every other day. Mix in 4-8ounces of fluid prior to taking. 527 g 2  . tiotropium (SPIRIVA WITH HANDIHALER) 18 mcg inhalation capsule Place 1 capsule (18 mcg total) into inhaler and inhale once daily. 90 capsule 4   No current facility-administered medications for this visit.   An updated, reconciled list of all medications, including OTC and herbal, was reviewed and a copy of the updated list was provided to the patient. Treatment plan and medications were discussed with the patient who voiced understanding. No barriers to learning.  Allergies: Allergies as of 04/28/2015 Loraine Leriche as Reviewed 04/28/2015  Allergen Reaction Noted  . Ace inhibitors Unknown  . Cardizem [diltiazem hcl] Other (See Comments) 05/07/2013  . Toprol  xl [metoprolol succinate] Other (See Comments) 05/07/2013   Social History: Social History   Social History  . Marital status: Widowed  Spouse name: N/A  . Number of children: N/A  . Years of education: N/A   Occupational History  . retired   Social History Main Topics  . Smoking status: Former Smoker  Types: Cigarettes  . Smokeless tobacco: Never Used  .  Alcohol use 0.0 oz/week  0 Standard drinks or equivalent per week  . Drug use: Not on file  . Sexual activity: Not on file   Other Topics Concern  . Not on file   Social History Narrative   Family History: Family History  Problem Relation Age of Onset  . Cancer Mother  . Heart disease Father   ROS: See above Review of systems otherwise negative except as indicated above in the history.  Physical Exam:  Vitals: Visit Vitals  . BP 120/60 (BP Location: Left upper arm, Patient Position: Sitting, BP Cuff Size: Small Adult)  . Pulse 61  . Ht 149.9 cm (4\' 11" )  . Wt 73.5 kg (162 lb 2 oz)  . SpO2 (!) 89%  . BMI 32.75 kg/m2   BMI: Body mass index is 32.75 kg/(m^2).  General Appearance: Appears well, no distress; Alert and oriented x 3  Neck: Carotids 2+ bilaterally, bilateral carotid bruits, JVD is flat  Lungs: clear to auscultation, without rales or wheeze, good air exchange  Heart: normal rate and regular rhythm  Abdomen: Soft, non-tender, bowel sounds active, no masses, no organomegaly  Extremities: Extremities normal, no cyanosis. no edema Bilateral  Skin: No rashes or ulcers. Device is located on the right infra-clavicular region.  Neurologic: Alert, interactive, and appropriate, grossly moving all 4 extremities      EKG 11/2014  Vent Rate (bpm) 81  QRS Interval (msec) 140  QT Interval (msec) 416  QTc (msec) 483  Narrative  Ventricular-paced rhythm Abnormal ECG   No recent echocardiograms  Assessment and plan: 1). BiV ICD- generator at ERI had a discussion with the patient and her family regarding her wishes given that she is recently a DNR at present they would like to remove the ICD generator andproceed with placement of a BiV pacemaker.   Risks and benefits explained and consent obtained.

## 2015-05-26 NOTE — Op Note (Signed)
Preoperative diagnosis Chronic systolic HF BIV ICD at [facility] Postoperative diagnosis same/ implantation of BIV PM  * BIV-ICD was replaced with BIV pacemaker  Procedure: Generator replacement; Assessment of leads   Following informed consent the patient was brought to the electrophysiology laboratory in place of the fluoroscopic table in the supine position after routine prep and drape lidocaine was infiltrated in the region of the previous incision and carried down to later the device pocket using sharp dissection and electrocautery. The pocket was opened the device was freed up and was explanted.  The leads were inspected. Repair was not needed. The leads were then attached to a VIVA CRT-Ppulse generator, serial number [id number].    Through the device the P-wave amplitude  Was  1.4 milllivolts and impedance of  437 ohms, and a pacing threshold of  1.0 volts at @ 0.62milliseconds; the  R ventricular lead  impedance of 342 ohms, and a pacing threshold of  1.2volts at 0.4 msec and the  Left ventricular lead demonstrated an  impedance of  703 ohms, and a pacing threshold of  1.5volts at 0.4 msec.      The pocket was irrigated with antibiotic containing saline solution hemostasis was assured and the leads and the device were placed in the pocket. The wound was then closed in 3 layers in normal fashion.  The patient tolerated the procedure without apparent complication.    EBL Minimal    Device:Medtronic Viva CRT-P SN [id number] Existing Leads: Atrial MDT 5076 [id number] 08/19/2005 RV MDT 2707 [id number] V 04/11/2011 HV capped LV 4194 [id number] V 08/19/2005 RV MDT 6949 capped 08/19/2005  Parameters: DDDR LR 60 UTR 120 [id number] Paced av 130  Sensed av 100

## 2015-05-26 NOTE — Anesthesia Procedure Notes (Signed)
Procedure Name: MAC Performed by: Yadhira Mckneely Pre-anesthesia Checklist: Patient identified, Emergency Drugs available, Suction available, Patient being monitored and Timeout performed Oxygen Delivery Method: Simple face mask       

## 2015-05-29 ENCOUNTER — Encounter: Payer: Self-pay | Admitting: Cardiology

## 2015-05-30 ENCOUNTER — Encounter: Payer: Self-pay | Admitting: Cardiology

## 2015-06-14 ENCOUNTER — Ambulatory Visit
Admission: RE | Admit: 2015-06-14 | Discharge: 2015-06-14 | Disposition: A | Discharge status: Home / Self Care | Payer: Medicare Other | Source: Ambulatory Visit | Attending: Otolaryngology | Admitting: Otolaryngology

## 2015-06-14 DIAGNOSIS — R1310 Dysphagia, unspecified: Secondary | ICD-10-CM | POA: Diagnosis present

## 2015-06-14 DIAGNOSIS — K224 Dyskinesia of esophagus: Secondary | ICD-10-CM | POA: Diagnosis not present

## 2015-06-14 DIAGNOSIS — K219 Gastro-esophageal reflux disease without esophagitis: Secondary | ICD-10-CM | POA: Insufficient documentation

## 2016-01-11 ENCOUNTER — Inpatient Hospital Stay
Admission: EM | Admit: 2016-01-11 | Discharge: 2016-01-15 | DRG: 389 | Disposition: A | Discharge status: Home Health | Payer: Medicare Other | Attending: General Surgery | Admitting: General Surgery

## 2016-01-11 ENCOUNTER — Emergency Department: Payer: Medicare Other

## 2016-01-11 ENCOUNTER — Inpatient Hospital Stay: Payer: Medicare Other

## 2016-01-11 ENCOUNTER — Encounter: Payer: Self-pay | Admitting: Occupational Medicine

## 2016-01-11 DIAGNOSIS — N179 Acute kidney failure, unspecified: Secondary | ICD-10-CM | POA: Diagnosis present

## 2016-01-11 DIAGNOSIS — Z7951 Long term (current) use of inhaled steroids: Secondary | ICD-10-CM | POA: Diagnosis not present

## 2016-01-11 DIAGNOSIS — Z7982 Long term (current) use of aspirin: Secondary | ICD-10-CM | POA: Diagnosis not present

## 2016-01-11 DIAGNOSIS — Z950 Presence of cardiac pacemaker: Secondary | ICD-10-CM | POA: Diagnosis not present

## 2016-01-11 DIAGNOSIS — I429 Cardiomyopathy, unspecified: Secondary | ICD-10-CM | POA: Diagnosis present

## 2016-01-11 DIAGNOSIS — I509 Heart failure, unspecified: Secondary | ICD-10-CM | POA: Diagnosis present

## 2016-01-11 DIAGNOSIS — Z800 Family history of malignant neoplasm of digestive organs: Secondary | ICD-10-CM

## 2016-01-11 DIAGNOSIS — Z8249 Family history of ischemic heart disease and other diseases of the circulatory system: Secondary | ICD-10-CM | POA: Diagnosis not present

## 2016-01-11 DIAGNOSIS — Z66 Do not resuscitate: Secondary | ICD-10-CM | POA: Diagnosis present

## 2016-01-11 DIAGNOSIS — Z79899 Other long term (current) drug therapy: Secondary | ICD-10-CM

## 2016-01-11 DIAGNOSIS — Z9841 Cataract extraction status, right eye: Secondary | ICD-10-CM

## 2016-01-11 DIAGNOSIS — E875 Hyperkalemia: Secondary | ICD-10-CM | POA: Diagnosis present

## 2016-01-11 DIAGNOSIS — Z90710 Acquired absence of both cervix and uterus: Secondary | ICD-10-CM

## 2016-01-11 DIAGNOSIS — K56600 Partial intestinal obstruction, unspecified as to cause: Principal | ICD-10-CM | POA: Diagnosis present

## 2016-01-11 DIAGNOSIS — K56609 Unspecified intestinal obstruction, unspecified as to partial versus complete obstruction: Secondary | ICD-10-CM | POA: Diagnosis present

## 2016-01-11 DIAGNOSIS — R2681 Unsteadiness on feet: Secondary | ICD-10-CM

## 2016-01-11 DIAGNOSIS — R0902 Hypoxemia: Secondary | ICD-10-CM | POA: Diagnosis present

## 2016-01-11 DIAGNOSIS — Z833 Family history of diabetes mellitus: Secondary | ICD-10-CM | POA: Diagnosis not present

## 2016-01-11 DIAGNOSIS — M6281 Muscle weakness (generalized): Secondary | ICD-10-CM

## 2016-01-11 DIAGNOSIS — Z888 Allergy status to other drugs, medicaments and biological substances status: Secondary | ICD-10-CM

## 2016-01-11 DIAGNOSIS — K219 Gastro-esophageal reflux disease without esophagitis: Secondary | ICD-10-CM | POA: Diagnosis present

## 2016-01-11 DIAGNOSIS — Z87891 Personal history of nicotine dependence: Secondary | ICD-10-CM

## 2016-01-11 DIAGNOSIS — I4891 Unspecified atrial fibrillation: Secondary | ICD-10-CM | POA: Diagnosis present

## 2016-01-11 DIAGNOSIS — Z9842 Cataract extraction status, left eye: Secondary | ICD-10-CM | POA: Diagnosis not present

## 2016-01-11 DIAGNOSIS — Z4659 Encounter for fitting and adjustment of other gastrointestinal appliance and device: Secondary | ICD-10-CM

## 2016-01-11 DIAGNOSIS — Z961 Presence of intraocular lens: Secondary | ICD-10-CM | POA: Diagnosis present

## 2016-01-11 LAB — CBC WITH DIFFERENTIAL/PLATELET
Basophils Absolute: 0.1 10*3/uL (ref 0–0.1)
Basophils Relative: 1 %
EOS PCT: 0 %
Eosinophils Absolute: 0 10*3/uL (ref 0–0.7)
HCT: 36 % (ref 35.0–47.0)
Hemoglobin: 11.6 g/dL — ABNORMAL LOW (ref 12.0–16.0)
LYMPHS ABS: 0.7 10*3/uL — AB (ref 1.0–3.6)
LYMPHS PCT: 4 %
MCH: 31.3 pg (ref 26.0–34.0)
MCHC: 32.2 g/dL (ref 32.0–36.0)
MCV: 97.1 fL (ref 80.0–100.0)
MONO ABS: 0.6 10*3/uL (ref 0.2–0.9)
Monocytes Relative: 4 %
Neutro Abs: 15.2 10*3/uL — ABNORMAL HIGH (ref 1.4–6.5)
Neutrophils Relative %: 91 %
PLATELETS: 236 10*3/uL (ref 150–440)
RBC: 3.71 MIL/uL — ABNORMAL LOW (ref 3.80–5.20)
RDW: 14 % (ref 11.5–14.5)
WBC: 16.6 10*3/uL — ABNORMAL HIGH (ref 3.6–11.0)

## 2016-01-11 LAB — COMPREHENSIVE METABOLIC PANEL
ALT: 18 U/L (ref 14–54)
AST: 29 U/L (ref 15–41)
Albumin: 4.1 g/dL (ref 3.5–5.0)
Alkaline Phosphatase: 57 U/L (ref 38–126)
Anion gap: 10 (ref 5–15)
BUN: 40 mg/dL — ABNORMAL HIGH (ref 6–20)
CHLORIDE: 99 mmol/L — AB (ref 101–111)
CO2: 29 mmol/L (ref 22–32)
CREATININE: 1.23 mg/dL — AB (ref 0.44–1.00)
Calcium: 9.5 mg/dL (ref 8.9–10.3)
GFR, EST AFRICAN AMERICAN: 45 mL/min — AB (ref >60)
GFR, EST NON AFRICAN AMERICAN: 39 mL/min — AB (ref >60)
Glucose, Bld: 183 mg/dL — ABNORMAL HIGH (ref 65–99)
POTASSIUM: 4.6 mmol/L (ref 3.5–5.1)
Sodium: 138 mmol/L (ref 135–145)
Total Bilirubin: 0.6 mg/dL (ref 0.3–1.2)
Total Protein: 7.8 g/dL (ref 6.5–8.1)

## 2016-01-11 LAB — LIPASE, BLOOD: LIPASE: 25 U/L (ref 11–51)

## 2016-01-11 MED ORDER — AMITRIPTYLINE HCL 25 MG PO TABS
75.0000 mg | ORAL_TABLET | Freq: Every day | ORAL | Status: DC
  Administered 2016-01-11 – 2016-01-14 (×4): 75 mg via ORAL
  Filled 2016-01-11 (×4): qty 3

## 2016-01-11 MED ORDER — IOPAMIDOL (ISOVUE-300) INJECTION 61%
75.0000 mL | Freq: Once | INTRAVENOUS | Status: AC | PRN
Start: 2016-01-11 — End: 2016-01-11
  Administered 2016-01-11: 75 mL via INTRAVENOUS

## 2016-01-11 MED ORDER — TIOTROPIUM BROMIDE MONOHYDRATE 18 MCG IN CAPS
18.0000 ug | ORAL_CAPSULE | Freq: Every day | RESPIRATORY_TRACT | Status: DC
  Administered 2016-01-11 – 2016-01-14 (×4): 18 ug via RESPIRATORY_TRACT
  Filled 2016-01-11: qty 5

## 2016-01-11 MED ORDER — SODIUM CHLORIDE 0.9 % IV SOLN
1000.0000 mL | Freq: Once | INTRAVENOUS | Status: AC
  Administered 2016-01-11: 1000 mL via INTRAVENOUS

## 2016-01-11 MED ORDER — FLUTICASONE FUROATE-VILANTEROL 100-25 MCG/INH IN AEPB
1.0000 | INHALATION_SPRAY | Freq: Every day | RESPIRATORY_TRACT | Status: DC
  Administered 2016-01-11 – 2016-01-14 (×4): 1 via RESPIRATORY_TRACT
  Filled 2016-01-11: qty 28

## 2016-01-11 MED ORDER — FUROSEMIDE 40 MG PO TABS
40.0000 mg | ORAL_TABLET | Freq: Every day | ORAL | Status: DC
  Administered 2016-01-11 – 2016-01-15 (×5): 40 mg via ORAL
  Filled 2016-01-11 (×6): qty 1

## 2016-01-11 MED ORDER — HYDROMORPHONE HCL 1 MG/ML IJ SOLN
0.5000 mg | Freq: Once | INTRAMUSCULAR | Status: AC
  Administered 2016-01-11: 0.5 mg via INTRAVENOUS
  Filled 2016-01-11: qty 1

## 2016-01-11 MED ORDER — ONDANSETRON HCL 4 MG/2ML IJ SOLN
4.0000 mg | Freq: Four times a day (QID) | INTRAMUSCULAR | Status: DC | PRN
  Administered 2016-01-11 – 2016-01-12 (×2): 4 mg via INTRAVENOUS
  Filled 2016-01-11 (×2): qty 2

## 2016-01-11 MED ORDER — DIPHENHYDRAMINE HCL 50 MG/ML IJ SOLN: 12.5000 mg | Freq: Four times a day (QID) | INTRAMUSCULAR | Status: DC | PRN

## 2016-01-11 MED ORDER — ORAL CARE MOUTH RINSE
15.0000 mL | Freq: Two times a day (BID) | OROMUCOSAL | Status: DC
  Administered 2016-01-11 – 2016-01-14 (×6): 15 mL via OROMUCOSAL

## 2016-01-11 MED ORDER — MORPHINE SULFATE (PF) 4 MG/ML IV SOLN: 2.0000 mg | INTRAVENOUS | Status: DC | PRN

## 2016-01-11 MED ORDER — ALBUTEROL SULFATE (2.5 MG/3ML) 0.083% IN NEBU: 2.5000 mg | INHALATION_SOLUTION | Freq: Two times a day (BID) | RESPIRATORY_TRACT | Status: DC

## 2016-01-11 MED ORDER — LACTATED RINGERS IV SOLN
INTRAVENOUS | Status: DC
  Administered 2016-01-11 – 2016-01-13 (×6): via INTRAVENOUS

## 2016-01-11 MED ORDER — DIPHENHYDRAMINE HCL 12.5 MG/5ML PO ELIX: 12.5000 mg | ORAL_SOLUTION | Freq: Four times a day (QID) | ORAL | Status: DC | PRN

## 2016-01-11 MED ORDER — ALBUTEROL SULFATE (2.5 MG/3ML) 0.083% IN NEBU
2.5000 mg | INHALATION_SOLUTION | Freq: Four times a day (QID) | RESPIRATORY_TRACT | Status: DC | PRN
  Administered 2016-01-14: 2.5 mg via RESPIRATORY_TRACT
  Filled 2016-01-11: qty 3

## 2016-01-11 MED ORDER — PANTOPRAZOLE SODIUM 40 MG IV SOLR
40.0000 mg | Freq: Every day | INTRAVENOUS | Status: DC
  Administered 2016-01-11 – 2016-01-12 (×2): 40 mg via INTRAVENOUS
  Filled 2016-01-11 (×2): qty 40

## 2016-01-11 MED ORDER — DIGOXIN 125 MCG PO TABS
0.1250 mg | ORAL_TABLET | Freq: Every day | ORAL | Status: DC
  Administered 2016-01-11 – 2016-01-15 (×5): 0.125 mg via ORAL
  Filled 2016-01-11 (×7): qty 1

## 2016-01-11 MED ORDER — ENOXAPARIN SODIUM 40 MG/0.4ML ~~LOC~~ SOLN
40.0000 mg | SUBCUTANEOUS | Status: DC
  Administered 2016-01-11 – 2016-01-14 (×4): 40 mg via SUBCUTANEOUS
  Filled 2016-01-11 (×4): qty 0.4

## 2016-01-11 MED ORDER — IOPAMIDOL (ISOVUE-300) INJECTION 61%
30.0000 mL | Freq: Once | INTRAVENOUS | Status: AC
  Administered 2016-01-11: 30 mL via ORAL

## 2016-01-11 MED ORDER — ONDANSETRON HCL 4 MG/2ML IJ SOLN
4.0000 mg | Freq: Once | INTRAMUSCULAR | Status: AC
  Administered 2016-01-11: 4 mg via INTRAVENOUS
  Filled 2016-01-11: qty 2

## 2016-01-11 MED ORDER — LOSARTAN POTASSIUM 50 MG PO TABS
25.0000 mg | ORAL_TABLET | Freq: Every day | ORAL | Status: DC
  Administered 2016-01-11: 25 mg via ORAL
  Filled 2016-01-11: qty 1

## 2016-01-11 MED ORDER — HYDRALAZINE HCL 20 MG/ML IJ SOLN: 10.0000 mg | INTRAMUSCULAR | Status: DC | PRN

## 2016-01-11 MED ORDER — MORPHINE SULFATE (PF) 4 MG/ML IV SOLN
4.0000 mg | Freq: Once | INTRAVENOUS | Status: AC
  Administered 2016-01-11: 4 mg via INTRAVENOUS
  Filled 2016-01-11: qty 1

## 2016-01-11 MED ORDER — ONDANSETRON 4 MG PO TBDP: 4.0000 mg | ORAL_TABLET | Freq: Four times a day (QID) | ORAL | Status: DC | PRN

## 2016-01-11 NOTE — Progress Notes (Signed)
Pt requesting NG tube despite having any emisis. Pt stating that she is very nauseas and feels bad. Primary nurse notified Dr. Pola Corn. MD to place orders. Primary nurse to continue to monitor.

## 2016-01-11 NOTE — ED Triage Notes (Signed)
Pt present from home with abd pain lower left and right since 11pm. Nausea and vomiting since 11pm. Pt reports vomiting to many count. Four times with ems. EMS gave zofran 4mg . Pt cant remember last BM. Pt has hx bowel obstruction, abd surgery, diverticula. Pt states some SOB. O2 status 76% RA. MD at the bedside and O2 3L via [place] applied.

## 2016-01-11 NOTE — ED Provider Notes (Signed)
[facility] Emergency Department Provider Note        Time seen: ----------------------------------------- 7:05 AM on 01/11/2016 -----------------------------------------    I have reviewed the triage vital signs and the nursing notes.   HISTORY  Chief Complaint Abdominal Pain; Emesis; and Shortness of Breath    HPI [name] is a 82 y.o. female who presents ER being brought from home for abdominal pain. Patient has had abdominal pain and vomiting this morning, she received Zofran in route with some improvement in her symptoms. She does report a history of diverticulitis and bowel obstruction. Patient states she is not moving her bowels or has not passed gas and lasts 48 hours. She denies fevers or chills, states she does feel somewhat short of breath.   Past Medical History:  Diagnosis Date  . AICD (automatic cardioverter/defibrillator) present   . Anemia   . Arthritis   . Asthma   . Atrial fibrillation (HCC)    elected not to take coumadin  . Bacteremia 2007   asystole pacemaker failed to capture due to lead wire  . Bowel obstruction    multiple  . Cardiomyopathy (HCC) 1990  . CHF (congestive heart failure) (HCC)   . COPD (chronic obstructive pulmonary disease) (HCC)   . Diverticulosis   . Emphysema of lung (HCC)   . GERD (gastroesophageal reflux disease)   . History of IBS   . Multiple gastric ulcers    in recent years  . Presence of permanent cardiac pacemaker   . Restless leg syndrome   . Shortness of breath dyspnea    with exertion  . 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 Ulcerative colitis (HCC)    2007    Patient Active Problem List   Diagnosis Date Noted  . Small bowel obstruction 10/11/2014  . 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 ABDOMINAL HYSTERECTOMY  1990  . APPENDECTOMY  1990  . CARDIAC CATHETERIZATION    . CHOLECYSTECTOMY     . COLON SURGERY    . COLOSTOMY Left 1992  . COLOSTOMY TAKEDOWN  1993  . EYE SURGERY Bilateral    Cataract Extraction with IOL  . IMPLANTABLE CARDIOVERTER DEFIBRILLATOR (ICD) GENERATOR CHANGE N/A 05/26/2015   Procedure: ICD GENERATOR CHANGE;  Surgeon: Sharion Settler, MD;  Location: [facility] ORS;  Service: Cardiovascular;  Laterality: N/A;  . PACEMAKER INSERTION  2003  . VENTRICULAR CARDIAC PACEMAKER INSERTION  2007   biventricular pacer with defib implanted 2007 concerto [id number] serial [id number]    Allergies Toprol xl [metoprolol] and Ace inhibitors  Social History Social History  Substance Use Topics  . Smoking status: Former Smoker    Packs/day: 1.00    Types: Cigarettes    Quit date: 03/28/2005  . Smokeless tobacco: Never Used  . Alcohol use Yes     Comment: occ    Review of Systems Constitutional: Negative for fever. Cardiovascular: Negative for chest pain. Respiratory: Positive for shortness of breath Gastrointestinal: Positive for abdominal pain, vomiting Genitourinary: Negative for dysuria. Musculoskeletal: Negative for back pain. Skin: Negative for rash. Neurological: Negative for headaches, focal weakness or numbness.  10-point ROS otherwise negative.  ____________________________________________   PHYSICAL EXAM:  VITAL SIGNS: ED Triage Vitals  Enc Vitals Group     BP      Pulse      Resp      Temp  Temp src      SpO2      Weight      Height      Head Circumference      Peak Flow      Pain Score      Pain Loc      Pain Edu?      Excl. in GC?     Constitutional: Alert and oriented. Mild distress Eyes: Conjunctivae are normal. PERRL. Normal extraocular movements. ENT   Head: Normocephalic and atraumatic.   Nose: No congestion/rhinnorhea.   Mouth/Throat: Mucous membranes are moist.   Neck: No stridor. Cardiovascular: Normal rate, regular rhythm. No murmurs, rubs, or gallops. Respiratory: Normal respiratory effort without  tachypnea nor retractions. Breath sounds are clear and equal bilaterally. No wheezes/rales/rhonchi. Gastrointestinal: Tender, distended. Bowel sounds normal at this time Musculoskeletal: Nontender with normal range of motion in all extremities. No lower extremity tenderness nor edema. Neurologic:  Normal speech and language. No gross focal neurologic deficits are appreciated.  Skin:  Skin is warm, dry and intact. No rash noted. Psychiatric: Mood and affect are normal. Speech and behavior are normal.  ____________________________________________  EKG: Interpreted by me. Atrial sensed ventricular paced complexes with a rate of 77 bpm, no other acute changes are identified  ____________________________________________  ED COURSE:  Pertinent labs & imaging results that were available during my care of the patient were reviewed by me and considered in my medical decision making (see chart for details). Clinical Course   Patient presents the ER with abdominal pain and vomiting, likely bowel obstruction. We will assess with labs and imaging. Unclear etiology for her hypoxia  Procedures ____________________________________________   LABS (pertinent positives/negatives)  Labs Reviewed  CBC WITH DIFFERENTIAL/PLATELET - Abnormal; Notable for the following:       Result Value   WBC 16.6 (*)    RBC 3.71 (*)    Hemoglobin 11.6 (*)    Neutro Abs 15.2 (*)    Lymphs Abs 0.7 (*)    All other components within normal limits  COMPREHENSIVE METABOLIC PANEL - Abnormal; Notable for the following:    Chloride 99 (*)    Glucose, Bld 183 (*)    BUN 40 (*)    Creatinine, Ser 1.23 (*)    GFR calc non Af Amer 39 (*)    GFR calc Af Amer 45 (*)    All other components within normal limits  LIPASE, BLOOD  URINALYSIS COMPLETEWITH MICROSCOPIC ([facility] ONLY)    RADIOLOGY Images were viewed by me  Acute abdominal series IMPRESSION: Moderate stool throughout colon. No bowel obstruction or free air. There  is upper lobe bullous disease. No edema or consolidation. There is aortic atherosclerosis. IMPRESSION: Recurrent high-grade small bowel obstruction, probably at the jejunal ileal junction. No evidence of perforation.  Chronic intra and extra hepatic biliary duct dilatation.  Aortic atherosclerosis.  Partial compression fractures L2 and L3, slightly progressive at L3 since August of 2016.  Ventral hernia containing fat and nonobstructed small intestine.   ____________________________________________  FINAL ASSESSMENT AND PLAN  Bowel obstruction  Plan: Patient with labs and imaging as dictated above. Patient presented clinically with small bowel traction and CT findings confirmed same. I discuss with general surgery who will evaluate the patient the ER and likely admit with bowel rest.   Emily FilbertWilliams, Maresha Anastos E, MD   Note: This dictation was prepared with Dragon dictation. Any transcriptional errors that result from this process are unintentional    Emily FilbertJonathan E Alee Katen, MD 01/11/16 1037

## 2016-01-11 NOTE — H&P (Signed)
Patient ID: [name], female   DOB: [birth year], 81 y.o.   MRN: [id number]  CC: Bowel obstruction  HPI [name] is a 82 y.o. female that is well known to the surgery department presents to the emergency department with complaints of abdominal pain, nausea, vomiting. This has happened numerous times before. This attack initially started with her not feeling well over the last 2 weeks however last 24 hours her abdominal pain worsened to the point where she decided to come the ER for further evaluation and treatment. Her last admission for bowel obstruction was August of last year. She's had numerous abdominal surgeries including bowel resections and hernia repairs. She states she has felt ill but denies any fevers or chills. She has chronic shortness of breath which is unchanged, she denies any chest pain, diarrhea, constipation. She's also had some dysuria that she was seen for in the clinic recently with a negative UA  HPI  Past Medical History:  Diagnosis Date  . AICD (automatic cardioverter/defibrillator) present   . Anemia   . Arthritis   . Asthma   . Atrial fibrillation (HCC)    elected not to take coumadin  . Bacteremia 2007   asystole pacemaker failed to capture due to lead wire  . Bowel obstruction    multiple  . Cardiomyopathy (HCC) 1990  . CHF (congestive heart failure) (HCC)   . COPD (chronic obstructive pulmonary disease) (HCC)   . Diverticulosis   . Emphysema of lung (HCC)   . GERD (gastroesophageal reflux disease)   . History of IBS   . Multiple gastric ulcers    in recent years  . Presence of permanent cardiac pacemaker   . Restless leg syndrome   . Shortness of breath dyspnea    with exertion  . 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 Ulcerative colitis (HCC)    2007    Past Surgical History:  Procedure Laterality Date  . ABDOMINAL HYSTERECTOMY  1990  . APPENDECTOMY  1990  . CARDIAC CATHETERIZATION    . CHOLECYSTECTOMY    . COLON  SURGERY    . COLOSTOMY Left 1992  . COLOSTOMY TAKEDOWN  1993  . EYE SURGERY Bilateral    Cataract Extraction with IOL  . IMPLANTABLE CARDIOVERTER DEFIBRILLATOR (ICD) GENERATOR CHANGE N/A 05/26/2015   Procedure: ICD GENERATOR CHANGE;  Surgeon: Sharion Settler, MD;  Location: [facility] ORS;  Service: Cardiovascular;  Laterality: N/A;  . PACEMAKER INSERTION  2003  . VENTRICULAR CARDIAC PACEMAKER INSERTION  2007   biventricular pacer with defib implanted 2007 concerto [id number] serial [id number]    Family History  Problem Relation Age of Onset  . Cancer Mother     Uterine  . Diabetes Father   . Heart disease Father     Social History Social History  Substance Use Topics  . Smoking status: Former Smoker    Packs/day: 1.00    Types: Cigarettes    Quit date: 03/28/2005  . Smokeless tobacco: Never Used  . Alcohol use Yes     Comment: occ    Allergies  Allergen Reactions  . Toprol Xl [Metoprolol] Other (See Comments)    Reaction: skin pulls away from the bottom of feet  . Ace Inhibitors Cough    Current Facility-Administered Medications  Medication Dose Route Frequency Provider Last Rate Last Dose  . HYDROmorphone (DILAUDID) injection 0.5 mg  0.5 mg Intravenous Once Emily Filbert, MD  Current Outpatient Prescriptions  Medication Sig Dispense Refill  . albuterol (PROVENTIL HFA;VENTOLIN HFA) 108 (90 BASE) MCG/ACT inhaler Inhale 2 puffs into the lungs 2 (two) times daily.    Marland Kitchen amitriptyline (ELAVIL) 75 MG tablet Take 75 mg by mouth at bedtime.    Marland Kitchen aspirin 81 MG chewable tablet Chew 81 mg by mouth daily.    . celecoxib (CELEBREX) 200 MG capsule Take 200 mg by mouth daily.     . digoxin (LANOXIN) 0.125 MG tablet Take 0.125 mg by mouth daily.    . Fluticasone-Salmeterol (ADVAIR) 100-50 MCG/DOSE AEPB Inhale 1 puff into the lungs 2 (two) times daily.    . furosemide (LASIX) 20 MG tablet Take 40 mg by mouth daily.     Marland Kitchen losartan (COZAAR) 25 MG tablet Take 25 mg by mouth daily.     Marland Kitchen omeprazole (PRILOSEC) 20 MG capsule Take 20 mg by mouth daily.     . pantoprazole (PROTONIX) 20 MG tablet Take 20 mg by mouth at bedtime.    . polyethylene glycol (MIRALAX / GLYCOLAX) packet Take 17 g by mouth daily.    Marland Kitchen tiotropium (SPIRIVA) 18 MCG inhalation capsule Place 18 mcg into inhaler and inhale daily.       Review of Systems A Multi-point review of systems was asked and was negative except for the findings documented in the history of present illness  Physical Exam Blood pressure 134/68, pulse 83, temperature 98 F (36.7 C), temperature source Oral, resp. rate 20, height 5\' 3"  (1.6 m), weight 73 kg (161 lb), SpO2 97 %. CONSTITUTIONAL: Resting in bed in no acute distress. EYES: Pupils are equal, round, and reactive to light, Sclera are non-icteric. EARS, NOSE, MOUTH AND THROAT: The oropharynx is clear. The oral mucosa is pink and moist. Hearing is intact to voice. LYMPH NODES:  Lymph nodes in the neck are normal. RESPIRATORY:  Lungs are clear. There is normal respiratory effort, with equal breath sounds bilaterally, and without pathologic use of accessory muscles. CARDIOVASCULAR: Heart is regular without murmurs, gallops, or rubs. GI: The abdomen is soft, nontender, and mildly. There is a large, soft midline hernia that is reducible. There is no hepatosplenomegaly. There are normal bowel sounds in all quadrants. GU: Rectal deferred.   MUSCULOSKELETAL: Normal muscle strength and tone. No cyanosis or edema.   SKIN: Turgor is good and there are no pathologic skin lesions or ulcers. NEUROLOGIC: Motor and sensation is grossly normal. Cranial nerves are grossly intact. PSYCH:  Oriented to person, place and time. Affect is normal.  Data Reviewed Images and labs reviewed. Labs show a leukocytosis of 16.6, a mildly elevated creatinine of 1.23. CT scan shows a dilated stomach and proximal small bowel with a transition point in the pelvis at the site of the prior anastomosis. There is  decompressed normal bowel distal to this. The hernia is uninvolved with the obstruction. There is no evidence of free fluid or free air. I have personally reviewed the patient's imaging, laboratory findings and medical records.    Assessment    Small bowel obstruction    Plan    82 year old female with a recurrent small bowel obstruction. Discussed the diagnosis with the patient again. She understands the treatment protocol of bowel rest and IV fluid. She strongly requests not to have an NG tube placed unless she actually has to. Discussed that should she develop vomiting again in spite of her anti-emetics that we should place 1 and she accepted this plan. Plan for admission, IV  fluids for hydration, electrolyte replacement as needed, serial exams.     Time spent with the patient was 50 minutes, with more than 50% of the time spent in face-to-face education, counseling and care coordination.     Ricarda Frameharles Osric Klopf, MD FACS General Surgeon 01/11/2016, 11:39 AM

## 2016-01-11 NOTE — Progress Notes (Signed)
Anticoagulation monitoring(Lovenox):  82 yo  female ordered Lovenox 30 mg Q24h  Filed Weights   01/11/16 0710  Weight: 161 lb (73 kg)   BMI    Lab Results  Component Value Date   CREATININE 1.23 (H) 01/11/2016   CREATININE 0.92 05/08/2015   CREATININE 0.63 10/13/2014   Estimated Creatinine Clearance: 31.4 mL/min (by C-G formula based on SCr of 1.23 mg/dL (H)). Hemoglobin & Hematocrit     Component Value Date/Time   HGB 11.6 (L) 01/11/2016 0716   HGB 11.5 (L) 05/22/2012 0412   HCT 36.0 01/11/2016 0716   HCT 33.2 (L) 05/22/2012 1021     Per Protocol for Patient with estCrcl > 30 ml/min and BMI < 40, will transition to Lovenox 40 mg Q24h.

## 2016-01-11 NOTE — ED Notes (Signed)
Received a call from, Annabelle and she stated that the room is not ready/clean - I informed her that my secretary documented at 1211 that the bed is ready   No response received  - I will continue to monitor the pt while we wait

## 2016-01-11 NOTE — Progress Notes (Signed)
NG tube placed. Dr. Letitia Libra notified and and orders received for chest xray. Primary nurse to continue to monitor.

## 2016-01-11 NOTE — Consult Note (Signed)
Reason for Consult medical management Referring Physician: Genessa Beman [name] is an 82 y.o. female.  HPI: This is an 82 year old female who was admitted by surgery for small bowel obstruction. She has a history of these before. We've been asked to consult to manage her multiple medical problems which include atrial fibrillation, COPD and cardiomyopathy. Responses she is often short of breath with little exertion and has been that way for quite some time. She is on her third pacemaker. She tells me the one she had before this was also defibrillator but at this one is just a pacemaker. She elected not to go on anticoagulation for her atrial fib. Currently she is pain-free and not having any active vomiting.  Past Medical History:  Diagnosis Date  . AICD (automatic cardioverter/defibrillator) present   . Anemia   . Arthritis   . Asthma   . Atrial fibrillation ([place])    elected not to take coumadin  . Bacteremia 2007   asystole pacemaker failed to capture due to lead wire  . Bowel obstruction    multiple  . Cardiomyopathy ([place]) 1990  . CHF (congestive heart failure) ([place])   . COPD (chronic obstructive pulmonary disease) ([place])   . Diverticulosis   . Emphysema of lung ([place])   . GERD (gastroesophageal reflux disease)   . History of IBS   . Multiple gastric ulcers    in recent years  . Presence of permanent cardiac pacemaker   . Restless leg syndrome   . Shortness of breath dyspnea    with exertion  . 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 Ulcerative colitis ([place])    2007    Past Surgical History:  Procedure Laterality Date  . ABDOMINAL HYSTERECTOMY  1990  . APPENDECTOMY  1990  . CARDIAC CATHETERIZATION    . CHOLECYSTECTOMY    . COLON SURGERY    . COLOSTOMY Left 1992  . COLOSTOMY TAKEDOWN  1993  . EYE SURGERY Bilateral    Cataract Extraction with IOL  . IMPLANTABLE CARDIOVERTER DEFIBRILLATOR (ICD) GENERATOR CHANGE N/A 05/26/2015   Procedure: ICD GENERATOR  CHANGE;  Surgeon: Marzetta Board, MD;  Location: [facility];  Service: Cardiovascular;  Laterality: N/A;  . PACEMAKER INSERTION  2003  . VENTRICULAR CARDIAC PACEMAKER INSERTION  2007   biventricular pacer with defib implanted [id number] concerto [id number] serial [id number]    Family History  Problem Relation Age of Onset  . Cancer Mother     Uterine  . Diabetes Father   . Heart disease Father     Social History:  reports that she quit smoking about 10 years ago. Her smoking use included Cigarettes. She smoked 1.00 pack per day. She has never used smokeless tobacco. She reports that she drinks alcohol. She reports that she does not use drugs.  Allergies:  Allergies  Allergen Reactions  . Toprol Xl [Metoprolol] Other (See Comments)    Reaction: skin pulls away from the bottom of feet  . Ace Inhibitors Cough    Medications:  I have reviewed the patient's current medications. Prior to Admission:  Prescriptions Prior to Admission  Medication Sig Dispense Refill Last Dose  . albuterol (PROVENTIL HFA;VENTOLIN HFA) 108 (90 BASE) MCG/ACT inhaler Inhale 2 puffs into the lungs 2 (two) times daily.   01/09/2016 at Unknown time  . amitriptyline (ELAVIL) 75 MG tablet Take 75 mg by mouth at bedtime.   01/10/2016 at 1900  . aspirin 81 MG chewable tablet Chew  81 mg by mouth daily.   01/10/2016 at 0900  . celecoxib (CELEBREX) 200 MG capsule Take 200 mg by mouth daily.    01/10/2016 at 0900  . digoxin (LANOXIN) 0.125 MG tablet Take 0.125 mg by mouth daily.   01/10/2016 at 0900  . Fluticasone-Salmeterol (ADVAIR) 100-50 MCG/DOSE AEPB Inhale 1 puff into the lungs 2 (two) times daily.   01/10/2016 at 1900  . furosemide (LASIX) 20 MG tablet Take 40 mg by mouth daily.    01/10/2016 at 0900  . losartan (COZAAR) 25 MG tablet Take 25 mg by mouth daily.   01/10/2016 at 0900  . omeprazole (PRILOSEC) 20 MG capsule Take 20 mg by mouth daily.    01/10/2016 at 0900  . pantoprazole (PROTONIX) 20 MG tablet Take 20 mg by  mouth at bedtime.   01/10/2016 at 1900  . polyethylene glycol (MIRALAX / GLYCOLAX) packet Take 17 g by mouth daily.   01/10/2016 at 1900  . tiotropium (SPIRIVA) 18 MCG inhalation capsule Place 18 mcg into inhaler and inhale daily.   01/10/2016 at 0900    Results for orders placed or performed during the hospital encounter of 01/11/16 (from the past 48 hour(s))  CBC with Differential     Status: Abnormal   Collection Time: 01/11/16  7:16 AM  Result Value Ref Range   WBC 16.6 (H) 3.6 - 11.0 K/uL   RBC 3.71 (L) 3.80 - 5.20 MIL/uL   Hemoglobin 11.6 (L) 12.0 - 16.0 g/dL   HCT 36.0 35.0 - 47.0 %   MCV 97.1 80.0 - 100.0 fL   MCH 31.3 26.0 - 34.0 pg   MCHC 32.2 32.0 - 36.0 g/dL   RDW 14.0 11.5 - 14.5 %   Platelets 236 150 - 440 K/uL   Neutrophils Relative % 91 %   Neutro Abs 15.2 (H) 1.4 - 6.5 K/uL   Lymphocytes Relative 4 %   Lymphs Abs 0.7 (L) 1.0 - 3.6 K/uL   Monocytes Relative 4 %   Monocytes Absolute 0.6 0.2 - 0.9 K/uL   Eosinophils Relative 0 %   Eosinophils Absolute 0.0 0 - 0.7 K/uL   Basophils Relative 1 %   Basophils Absolute 0.1 0 - 0.1 K/uL  Comprehensive metabolic panel     Status: Abnormal   Collection Time: 01/11/16  7:16 AM  Result Value Ref Range   Sodium 138 135 - 145 mmol/L   Potassium 4.6 3.5 - 5.1 mmol/L   Chloride 99 (L) 101 - 111 mmol/L   CO2 29 22 - 32 mmol/L   Glucose, Bld 183 (H) 65 - 99 mg/dL   BUN 40 (H) 6 - 20 mg/dL   Creatinine, Ser 1.23 (H) 0.44 - 1.00 mg/dL   Calcium 9.5 8.9 - 10.3 mg/dL   Total Protein 7.8 6.5 - 8.1 g/dL   Albumin 4.1 3.5 - 5.0 g/dL   AST 29 15 - 41 U/L   ALT 18 14 - 54 U/L   Alkaline Phosphatase 57 38 - 126 U/L   Total Bilirubin 0.6 0.3 - 1.2 mg/dL   GFR calc non Af Amer 39 (L) >60 mL/min   GFR calc Af Amer 45 (L) >60 mL/min    Comment: (NOTE) The eGFR has been calculated using the CKD EPI equation. This calculation has not been validated in all clinical situations. eGFR's persistently <60 mL/min signify possible Chronic  Kidney Disease.    Anion gap 10 5 - 15  Lipase, blood     Status: None  Collection Time: 01/11/16  7:16 AM  Result Value Ref Range   Lipase 25 11 - 51 U/L    Ct Abdomen Pelvis W Contrast  Result Date: 01/11/2016 CLINICAL DATA:  Diffuse abdominal pain with nausea and vomiting. History of small bowel obstruction. EXAM: CT ABDOMEN AND PELVIS WITH CONTRAST TECHNIQUE: Multidetector CT imaging of the abdomen and pelvis was performed using the standard protocol following bolus administration of intravenous contrast. CONTRAST:  35m ISOVUE-300 IOPAMIDOL (ISOVUE-300) INJECTION 61% COMPARISON:  10/11/2014 FINDINGS: Lower chest: Mild scarring.  No active process. Hepatobiliary: Intra and extra hepatic ductal dilatation again noted. No significant liver parenchymal lesion. Previous cholecystectomy. Pancreas: Fatty atrophy.  No active process. Spleen: Normal Adrenals/Urinary Tract: Adrenal glands are normal. Kidneys are slightly small with some benign appearing cysts. Stomach/Bowel: Dilated stomach and small intestine 2D jejunoileal junction where there is a transition zone indicating small bowel obstruction. The patient has had previous small bowel surgery. Vascular/Lymphatic: Aortic atherosclerosis. Atherosclerosis of the aortic branch vessels. Maximal diameter of the infrarenal aorta 2.4 cm. IVC is normal. No retroperitoneal lymphadenopathy. Reproductive: Previous hysterectomy. Other: No free fluid or air. Ventral hernia containing fat and nonobstructed small intestine. Musculoskeletal: Advanced chronic degenerative changes of the lumbar spine. Partial compression fractures of L2 and L3, slightly progressed at L3 compared to the study of August 2016. IMPRESSION: Recurrent high-grade small bowel obstruction, probably at the jejunal ileal junction. No evidence of perforation. Chronic intra and extra hepatic biliary duct dilatation. Aortic atherosclerosis. Partial compression fractures L2 and L3, slightly  progressive at L3 since August of 2016. Ventral hernia containing fat and nonobstructed small intestine. Electronically Signed   By: MNelson ChimesM.D.   On: 01/11/2016 10:30   Dg Abd Acute W/chest  Result Date: 01/11/2016 CLINICAL DATA:  Abdominal pain with vomiting EXAM: DG ABDOMEN ACUTE W/ 1V CHEST COMPARISON:  Chest radiograph October 11, 2014; CT abdomen and pelvis October 11, 2014 FINDINGS: PA chest: There is no edema or consolidation. There is evidence suggesting bullous disease in the upper lobes. There is focal scarring in the left upper lobe. Heart size is within normal limits. Pulmonary vascularity reflects the underlying bullous disease in the upper lobes. There is aortic atherosclerosis. Pacemaker/defibrillator leads are attached to the right atrium, right ventricle, and left ventricle. Bones are osteoporotic. There is an old fracture of the lateral right seventh rib. Supine and upright abdomen: There is moderate stool throughout the colon. There is no bowel dilatation or air-fluid level suggesting bowel obstruction. No free air. There is degenerative change in the lumbar spine. There is atherosclerotic calcification in the abdominal aorta. IMPRESSION: Moderate stool throughout colon. No bowel obstruction or free air. There is upper lobe bullous disease. No edema or consolidation. There is aortic atherosclerosis. Electronically Signed   By: WLowella GripIII M.D.   On: 01/11/2016 08:49    Review of Systems  Constitutional: Negative for chills and fever.  HENT: Negative for hearing loss.   Eyes: Negative for blurred vision.  Respiratory: Positive for shortness of breath.   Cardiovascular: Negative for chest pain.  Gastrointestinal: Positive for abdominal pain, nausea and vomiting.  Genitourinary: Negative for dysuria.  Musculoskeletal: Negative for joint pain.  Skin: Negative for rash.  Neurological: Negative for sensory change.   Blood pressure 132/61, pulse 80, temperature 97.9 F  (36.6 C), temperature source Oral, resp. rate 16, height 5' 3"  (1.6 m), weight 73 kg (161 lb), SpO2 93 %. Physical Exam  Constitutional: She is oriented to person, place, and  time. She appears well-developed and well-nourished. No distress.  HENT:  Head: Normocephalic.  Mouth/Throat: Oropharynx is clear and moist. No oropharyngeal exudate.  Eyes: Pupils are equal, round, and reactive to light. No scleral icterus.  Neck: No JVD present. No tracheal deviation present. No thyromegaly present.  Cardiovascular: Normal rate.   No murmur heard. Respiratory: Effort normal and breath sounds normal. No respiratory distress. She has no wheezes.  GI:  Very mild distention. Tinkling bowel sounds. No tenderness.  Musculoskeletal: Normal range of motion. She exhibits no edema or tenderness.  Lymphadenopathy:    She has no cervical adenopathy.  Neurological: She is alert and oriented to person, place, and time. No cranial nerve deficit.  Skin: Skin is warm and dry.    Assessment/Plan: 1. Small bowel obstruction. This is being conservatively managed by surgery. Patient is currently nothing by mouth. No NG tube at this time.  2. Acute renal failure. This is mild. Likely secondary to nausea and vomiting and poor by mouth intake. Agree with gentle hydration with IV fluids. Recheck renal function tomorrow. Monitor fluids closely as not to fluid overload.  3. COPD. Patient is often short of breath secondary to this. Currently she is not wheezing on exam. We'll continue her scheduled MDIs. We'll change nebulizers to when necessary and more frequently if needed.  4. Atrial fibrillation. She elected not to go on anticoagulation but is on aspirin which is currently being held. She has a pacemaker and currently she is in a paced rhythm. We'll continue rate control with current medications.  5. CODE STATUS. She wishes to be a DO NOT RESUSCITATE.  Time spent 45 minutes.  Harrel Lemon D 01/11/2016, 3:55 PM

## 2016-01-12 ENCOUNTER — Inpatient Hospital Stay: Payer: Medicare Other

## 2016-01-12 LAB — BASIC METABOLIC PANEL
ANION GAP: 10 (ref 5–15)
BUN: 30 mg/dL — ABNORMAL HIGH (ref 6–20)
CALCIUM: 8.8 mg/dL — AB (ref 8.9–10.3)
CO2: 30 mmol/L (ref 22–32)
CREATININE: 1.01 mg/dL — AB (ref 0.44–1.00)
Chloride: 100 mmol/L — ABNORMAL LOW (ref 101–111)
GFR calc Af Amer: 57 mL/min — ABNORMAL LOW (ref >60)
GFR, EST NON AFRICAN AMERICAN: 49 mL/min — AB (ref >60)
Glucose, Bld: 113 mg/dL — ABNORMAL HIGH (ref 65–99)
Potassium: 5 mmol/L (ref 3.5–5.1)
SODIUM: 140 mmol/L (ref 135–145)

## 2016-01-12 LAB — URINALYSIS COMPLETE WITH MICROSCOPIC (ARMC ONLY)
BILIRUBIN URINE: NEGATIVE
Bacteria, UA: NONE SEEN
GLUCOSE, UA: NEGATIVE mg/dL
HGB URINE DIPSTICK: NEGATIVE
Ketones, ur: NEGATIVE mg/dL
LEUKOCYTES UA: NEGATIVE
NITRITE: NEGATIVE
Protein, ur: NEGATIVE mg/dL
SPECIFIC GRAVITY, URINE: 1.038 — AB (ref 1.005–1.030)
pH: 6 (ref 5.0–8.0)

## 2016-01-12 LAB — CBC
HEMATOCRIT: 30.2 % — AB (ref 35.0–47.0)
Hemoglobin: 10.3 g/dL — ABNORMAL LOW (ref 12.0–16.0)
MCH: 32.4 pg (ref 26.0–34.0)
MCHC: 34.2 g/dL (ref 32.0–36.0)
MCV: 94.7 fL (ref 80.0–100.0)
Platelets: 173 10*3/uL (ref 150–440)
RBC: 3.19 MIL/uL — ABNORMAL LOW (ref 3.80–5.20)
RDW: 14.4 % (ref 11.5–14.5)
WBC: 9.4 10*3/uL (ref 3.6–11.0)

## 2016-01-12 LAB — DIGOXIN LEVEL: DIGOXIN LVL: 0.5 ng/mL — AB (ref 0.8–2.0)

## 2016-01-12 LAB — MAGNESIUM: Magnesium: 2.1 mg/dL (ref 1.7–2.4)

## 2016-01-12 LAB — PHOSPHORUS: PHOSPHORUS: 3.6 mg/dL (ref 2.5–4.6)

## 2016-01-12 NOTE — Care Management (Signed)
Patient admitted with SBO.  NG in place.  Hx of SBO in the past.  Patient likes alone.  Adult son lives near by for support.  Ambulates with a walker.  PCP Daniel Nones. PT consult pending

## 2016-01-12 NOTE — Care Management Important Message (Signed)
Important Message  Patient Details  Name: [name] MRN: [id number] Date of Birth: 1927/12/12   Medicare Important Message Given:  Yes    Chapman Fitch, RN 01/12/2016, 9:58 AM

## 2016-01-12 NOTE — Progress Notes (Signed)
[facility] [place] [facility]   PATIENT NAME: [name]    MR#:  [id number]  DATE OF BIRTH:  15-May-1927  SUBJECTIVE:  CHIEF COMPLAINT:   Chief Complaint  Patient presents with  . Abdominal Pain  . Emesis  . Shortness of Breath   - Patient with multiple abdominal surgeries admitted with distention and nausea, vomiting. Small bowel obstruction noted. Currently has NG tube with minimal drainage -Nausea has improved. -On 3 L oxygen. Patient does not use home oxygen  REVIEW OF SYSTEMS:  Review of Systems  Constitutional: Negative for chills, fever and malaise/fatigue.  HENT: Negative for ear discharge, ear pain, hearing loss and nosebleeds.   Eyes: Negative for blurred vision, double vision and photophobia.  Respiratory: Positive for shortness of breath. Negative for cough, hemoptysis and wheezing.   Cardiovascular: Negative for chest pain, palpitations, orthopnea and leg swelling.  Gastrointestinal: Positive for abdominal pain. Negative for constipation, diarrhea, heartburn, melena, nausea and vomiting.  Genitourinary: Negative for dysuria.  Musculoskeletal: Negative for myalgias and neck pain.  Skin: Negative for rash.  Neurological: Negative for dizziness, sensory change, speech change, focal weakness and headaches.  Endo/Heme/Allergies: Does not bruise/bleed easily.  Psychiatric/Behavioral: Negative for depression.    DRUG ALLERGIES:   Allergies  Allergen Reactions  . Toprol Xl [Metoprolol] Other (See Comments)    Reaction: skin pulls away from the bottom of feet  . Ace Inhibitors Cough    VITALS:  Blood pressure (!) 130/53, pulse 88, temperature 98.3 F (36.8 C), temperature source Oral, resp. rate 20, height 5\' 3"  (1.6 m), weight 76.3 kg (168 lb 3.2 oz), SpO2 91 %.  PHYSICAL EXAMINATION:  Physical Exam  GENERAL:  82 y.o.-year-old patient lying in the bed with no acute distress.  EYES: Pupils equal, round, reactive to light and  accommodation. No scleral icterus. Extraocular muscles intact.  HEENT: Head atraumatic, normocephalic. Oropharynx and nasopharynx clear.  NECK:  Supple, no jugular venous distention. No thyroid enlargement, no tenderness.  LUNGS: Normal breath sounds bilaterally, no wheezing, rales,rhonchi or crepitation. No use of accessory muscles of respiration. Diminished bibasilar breath sounds. CARDIOVASCULAR: S1, S2 normal. No rubs, or gallops. 3/6 systolic murmur is present ABDOMEN: Soft, distended with hernia infraumbilical from previous surgical scars, nontender, . Bowel sounds present. No organomegaly or mass.  EXTREMITIES: No pedal edema, cyanosis, or clubbing.  NEUROLOGIC: Cranial nerves II through XII are intact. Muscle strength 5/5 in all extremities. Sensation intact. Gait not checked.  PSYCHIATRIC: The patient is alert and oriented x 3.  SKIN: No obvious rash, lesion, or ulcer.    LABORATORY PANEL:   CBC  Recent Labs Lab 01/12/16 0607  WBC 9.4  HGB 10.3*  HCT 30.2*  PLT 173   ------------------------------------------------------------------------------------------------------------------  Chemistries   Recent Labs Lab 01/11/16 0716 01/12/16 0607  NA 138 140  K 4.6 5.0  CL 99* 100*  CO2 29 30  GLUCOSE 183* 113*  BUN 40* 30*  CREATININE 1.23* 1.01*  CALCIUM 9.5 8.8*  MG  --  2.1  AST 29  --   ALT 18  --   ALKPHOS 57  --   BILITOT 0.6  --    ------------------------------------------------------------------------------------------------------------------  Cardiac Enzymes No results for input(s): TROPONINI in the last 168 hours. ------------------------------------------------------------------------------------------------------------------  RADIOLOGY:  Ct Abdomen Pelvis W Contrast  Result Date: 01/11/2016 CLINICAL DATA:  Diffuse abdominal pain with nausea and vomiting. History of small bowel obstruction. EXAM: CT ABDOMEN AND PELVIS WITH CONTRAST TECHNIQUE:  Multidetector CT  imaging of the abdomen and pelvis was performed using the standard protocol following bolus administration of intravenous contrast. CONTRAST:  75mL ISOVUE-300 IOPAMIDOL (ISOVUE-300) INJECTION 61% COMPARISON:  10/11/2014 FINDINGS: Lower chest: Mild scarring.  No active process. Hepatobiliary: Intra and extra hepatic ductal dilatation again noted. No significant liver parenchymal lesion. Previous cholecystectomy. Pancreas: Fatty atrophy.  No active process. Spleen: Normal Adrenals/Urinary Tract: Adrenal glands are normal. Kidneys are slightly small with some benign appearing cysts. Stomach/Bowel: Dilated stomach and small intestine 2D jejunoileal junction where there is a transition zone indicating small bowel obstruction. The patient has had previous small bowel surgery. Vascular/Lymphatic: Aortic atherosclerosis. Atherosclerosis of the aortic branch vessels. Maximal diameter of the infrarenal aorta 2.4 cm. IVC is normal. No retroperitoneal lymphadenopathy. Reproductive: Previous hysterectomy. Other: No free fluid or air. Ventral hernia containing fat and nonobstructed small intestine. Musculoskeletal: Advanced chronic degenerative changes of the lumbar spine. Partial compression fractures of L2 and L3, slightly progressed at L3 compared to the study of August 2016. IMPRESSION: Recurrent high-grade small bowel obstruction, probably at the jejunal ileal junction. No evidence of perforation. Chronic intra and extra hepatic biliary duct dilatation. Aortic atherosclerosis. Partial compression fractures L2 and L3, slightly progressive at L3 since August of 2016. Ventral hernia containing fat and nonobstructed small intestine. Electronically Signed   By: Paulina Fusi M.D.   On: 01/11/2016 10:30   Dg Abd Acute W/chest  Result Date: 01/11/2016 CLINICAL DATA:  Abdominal pain with vomiting EXAM: DG ABDOMEN ACUTE W/ 1V CHEST COMPARISON:  Chest radiograph October 11, 2014; CT abdomen and pelvis October 11, 2014 FINDINGS: PA chest: There is no edema or consolidation. There is evidence suggesting bullous disease in the upper lobes. There is focal scarring in the left upper lobe. Heart size is within normal limits. Pulmonary vascularity reflects the underlying bullous disease in the upper lobes. There is aortic atherosclerosis. Pacemaker/defibrillator leads are attached to the right atrium, right ventricle, and left ventricle. Bones are osteoporotic. There is an old fracture of the lateral right seventh rib. Supine and upright abdomen: There is moderate stool throughout the colon. There is no bowel dilatation or air-fluid level suggesting bowel obstruction. No free air. There is degenerative change in the lumbar spine. There is atherosclerotic calcification in the abdominal aorta. IMPRESSION: Moderate stool throughout colon. No bowel obstruction or free air. There is upper lobe bullous disease. No edema or consolidation. There is aortic atherosclerosis. Electronically Signed   By: Bretta Bang III M.D.   On: 01/11/2016 08:49   Dg Abd Portable 1v  Result Date: 01/11/2016 CLINICAL DATA:  Nasogastric tube placement. EXAM: PORTABLE ABDOMEN - 1 VIEW COMPARISON:  None. FINDINGS: AP view of the lower chest and upper abdomen. Nasogastric tube tip projects in mid stomach, side port projecting in proximal stomach. Bibasilar strandy densities/atelectasis. LEFT apical bullous changes. Cardiac defibrillator in situ. Included bowel gas pattern is nondilated and nonobstructive. IMPRESSION: Nasogastric tube tip projects in mid stomach. Electronically Signed   By: Awilda Metro M.D.   On: 01/11/2016 21:25   Dg Abd Portable 2v  Result Date: 01/12/2016 CLINICAL DATA:  Small bowel obstruction EXAM: PORTABLE ABDOMEN - 2 VIEW COMPARISON:  01/11/2016 FINDINGS: NG tube extends the stomach. Multiple cardiac leads unchanged. Dilated loop of small bowel in the central abdomen measuring 4 cm not significantly changed. Gas and  stool present throughout the colon. The rectum is not well evaluated due to contrast in the bladder. IMPRESSION: 1. NG tube in stomach. 2. No evidence of high-grade  bowel obstruction. 3. Persistent dilated loops small bowel in the mid abdomen. Electronically Signed   By: Genevive Bi M.D.   On: 01/12/2016 09:48    EKG:   Orders placed or performed during the hospital encounter of 10/11/14  . EKG 12-Lead  . EKG 12-Lead  . EKG 12-Lead  . EKG 12-Lead  . EKG    ASSESSMENT AND PLAN:   82 year old female with past medical history significant for A. fib status post pacemaker, not an anticoagulation, anemia, arthritis, multiple abdominal surgeries, history of ulcerative colitis and diverticulosis, cardiomyopathy and congestive heart failure, COPD not on home oxygen presents secondary to abdominal pain and nausea, vomiting.  #1 small bowel obstruction-conservator to treatment. Appreciate surgical input. -IV fluids, NG tube and anti-emetics. -KUB follow-up today. The drainage is decreased and KUBs improving, NG tube can be discontinued.  #2 acute hypoxia-secondary to COPD and underlying CHF. No exacerbation noted. -Continue to wean oxygen. Continue inhalers and nebulizers. -Encouraged incentive spirometry  #3 cardiomyopathy with congestive heart failure-unknown type. No recent echocardiogram done. Per cardiology notes, patient has non-ischemic cardiomyopathy. -Decreased IV fluids rate. Continue Lasix. Hold losartan due to hyperkalemia -She has pacemaker for history of complete heart block.  #4 history of atrial fibrillation-on digoxin. Rate controlled. -History of complete heart block status post pacemaker. -No anticoagulation due to patient's request  #5 DVT prophylaxis-on Lovenox   Physical therapy consult when able to. Ambulates with walker at baseline.   All the records are reviewed and case discussed with Care Management/Social Workerr. Management plans discussed with the  patient, family and they are in agreement.  CODE STATUS: Full code  TOTAL TIME TAKING CARE OF THIS PATIENT: 38 minutes.   POSSIBLE D/C IN 1-2 DAYS, DEPENDING ON CLINICAL CONDITION.   Enid Baas M.D on 01/12/2016 at 9:55 AM  Between 7am to 6pm - Pager - [phone]  After 6pm go to [email or web address] - Social research officer, government  [facility]  Office  [phone]  CC: Primary care physician; Lynnea Ferrier, MD

## 2016-01-12 NOTE — Progress Notes (Signed)
CC: Bowel obstruction Subjective: Patient reports feeling much better today than on admission yesterday. She did require placement of an NG tube overnight with near immediate output of over a liter. She states she is still passing flatus and her abdominal pain is currently gone.  Objective: Vital signs in last 24 hours: Temp:  [97.9 F (36.6 C)-98.3 F (36.8 C)] 98.3 F (36.8 C) (11/17 0444) Pulse Rate:  [77-88] 88 (11/17 0445) Resp:  [15-24] 20 (11/17 0444) BP: (113-155)/(48-95) 130/53 (11/17 0444) SpO2:  [84 %-97 %] 91 % (11/17 0445) Weight:  [76.3 kg (168 lb 3.2 oz)] 76.3 kg (168 lb 3.2 oz) (11/16 1600) Last BM Date: 01/09/16  Intake/Output from previous day: 11/16 0701 - 11/17 0700 In: 1648.2 [I.V.:1588.2; NG/GT:60] Out: 2300 [Urine:350; Emesis/NG output:1950] Intake/Output this shift: No intake/output data recorded.  Physical exam:  Gen.: No acute distress Chest: Clear to auscultation Heart: Regular rate and rhythm Abdomen: Soft, nontender, nondistended  Lab Results: CBC   Recent Labs  01/11/16 0716 01/12/16 0607  WBC 16.6* 9.4  HGB 11.6* 10.3*  HCT 36.0 30.2*  PLT 236 173   BMET  Recent Labs  01/11/16 0716 01/12/16 0607  NA 138 140  K 4.6 5.0  CL 99* 100*  CO2 29 30  GLUCOSE 183* 113*  BUN 40* 30*  CREATININE 1.23* 1.01*  CALCIUM 9.5 8.8*   PT/INR No results for input(s): LABPROT, INR in the last 72 hours. ABG No results for input(s): PHART, HCO3 in the last 72 hours.  Invalid input(s): PCO2, PO2  Studies/Results: Ct Abdomen Pelvis W Contrast  Result Date: 01/11/2016 CLINICAL DATA:  Diffuse abdominal pain with nausea and vomiting. History of small bowel obstruction. EXAM: CT ABDOMEN AND PELVIS WITH CONTRAST TECHNIQUE: Multidetector CT imaging of the abdomen and pelvis was performed using the standard protocol following bolus administration of intravenous contrast. CONTRAST:  75mL ISOVUE-300 IOPAMIDOL (ISOVUE-300) INJECTION 61% COMPARISON:   10/11/2014 FINDINGS: Lower chest: Mild scarring.  No active process. Hepatobiliary: Intra and extra hepatic ductal dilatation again noted. No significant liver parenchymal lesion. Previous cholecystectomy. Pancreas: Fatty atrophy.  No active process. Spleen: Normal Adrenals/Urinary Tract: Adrenal glands are normal. Kidneys are slightly small with some benign appearing cysts. Stomach/Bowel: Dilated stomach and small intestine 2D jejunoileal junction where there is a transition zone indicating small bowel obstruction. The patient has had previous small bowel surgery. Vascular/Lymphatic: Aortic atherosclerosis. Atherosclerosis of the aortic branch vessels. Maximal diameter of the infrarenal aorta 2.4 cm. IVC is normal. No retroperitoneal lymphadenopathy. Reproductive: Previous hysterectomy. Other: No free fluid or air. Ventral hernia containing fat and nonobstructed small intestine. Musculoskeletal: Advanced chronic degenerative changes of the lumbar spine. Partial compression fractures of L2 and L3, slightly progressed at L3 compared to the study of August 2016. IMPRESSION: Recurrent high-grade small bowel obstruction, probably at the jejunal ileal junction. No evidence of perforation. Chronic intra and extra hepatic biliary duct dilatation. Aortic atherosclerosis. Partial compression fractures L2 and L3, slightly progressive at L3 since August of 2016. Ventral hernia containing fat and nonobstructed small intestine. Electronically Signed   By: Paulina FusiMark  Shogry M.D.   On: 01/11/2016 10:30   Dg Abd Acute W/chest  Result Date: 01/11/2016 CLINICAL DATA:  Abdominal pain with vomiting EXAM: DG ABDOMEN ACUTE W/ 1V CHEST COMPARISON:  Chest radiograph October 11, 2014; CT abdomen and pelvis October 11, 2014 FINDINGS: PA chest: There is no edema or consolidation. There is evidence suggesting bullous disease in the upper lobes. There is focal scarring in the left  upper lobe. Heart size is within normal limits. Pulmonary  vascularity reflects the underlying bullous disease in the upper lobes. There is aortic atherosclerosis. Pacemaker/defibrillator leads are attached to the right atrium, right ventricle, and left ventricle. Bones are osteoporotic. There is an old fracture of the lateral right seventh rib. Supine and upright abdomen: There is moderate stool throughout the colon. There is no bowel dilatation or air-fluid level suggesting bowel obstruction. No free air. There is degenerative change in the lumbar spine. There is atherosclerotic calcification in the abdominal aorta. IMPRESSION: Moderate stool throughout colon. No bowel obstruction or free air. There is upper lobe bullous disease. No edema or consolidation. There is aortic atherosclerosis. Electronically Signed   By: Bretta Bang III M.D.   On: 01/11/2016 08:49   Dg Abd Portable 1v  Result Date: 01/11/2016 CLINICAL DATA:  Nasogastric tube placement. EXAM: PORTABLE ABDOMEN - 1 VIEW COMPARISON:  None. FINDINGS: AP view of the lower chest and upper abdomen. Nasogastric tube tip projects in mid stomach, side port projecting in proximal stomach. Bibasilar strandy densities/atelectasis. LEFT apical bullous changes. Cardiac defibrillator in situ. Included bowel gas pattern is nondilated and nonobstructive. IMPRESSION: Nasogastric tube tip projects in mid stomach. Electronically Signed   By: Awilda Metro M.D.   On: 01/11/2016 21:25   Dg Abd Portable 2v  Result Date: 01/12/2016 CLINICAL DATA:  Small bowel obstruction EXAM: PORTABLE ABDOMEN - 2 VIEW COMPARISON:  01/11/2016 FINDINGS: NG tube extends the stomach. Multiple cardiac leads unchanged. Dilated loop of small bowel in the central abdomen measuring 4 cm not significantly changed. Gas and stool present throughout the colon. The rectum is not well evaluated due to contrast in the bladder. IMPRESSION: 1. NG tube in stomach. 2. No evidence of high-grade bowel obstruction. 3. Persistent dilated loops small bowel  in the mid abdomen. Electronically Signed   By: Genevive Bi M.D.   On: 01/12/2016 09:48    Anti-infectives: Anti-infectives    None      Assessment/Plan:  82 year old female admitted for small bowel obstruction. Required NG tube placement overnight with near immediate relief of her nausea and abdominal pain. Appreciate internal medicine assistance with her chronic medical conditions. Continue NG tube for decompression. Monitor for return of bowel function. Encourage ambulation and incentive spirometer usage.  Delyla Sandeen T. Tonita Cong, MD, FACS  01/12/2016

## 2016-01-13 ENCOUNTER — Inpatient Hospital Stay: Payer: Medicare Other

## 2016-01-13 DIAGNOSIS — K56609 Unspecified intestinal obstruction, unspecified as to partial versus complete obstruction: Secondary | ICD-10-CM

## 2016-01-13 LAB — BASIC METABOLIC PANEL
ANION GAP: 6 (ref 5–15)
BUN: 23 mg/dL — ABNORMAL HIGH (ref 6–20)
CO2: 34 mmol/L — ABNORMAL HIGH (ref 22–32)
Calcium: 8.4 mg/dL — ABNORMAL LOW (ref 8.9–10.3)
Chloride: 101 mmol/L (ref 101–111)
Creatinine, Ser: 0.73 mg/dL (ref 0.44–1.00)
GLUCOSE: 88 mg/dL (ref 65–99)
POTASSIUM: 3.6 mmol/L (ref 3.5–5.1)
Sodium: 141 mmol/L (ref 135–145)

## 2016-01-13 LAB — CBC
HEMATOCRIT: 30.3 % — AB (ref 35.0–47.0)
HEMOGLOBIN: 10 g/dL — AB (ref 12.0–16.0)
MCH: 32.1 pg (ref 26.0–34.0)
MCHC: 33.1 g/dL (ref 32.0–36.0)
MCV: 97 fL (ref 80.0–100.0)
Platelets: 156 10*3/uL (ref 150–440)
RBC: 3.12 MIL/uL — AB (ref 3.80–5.20)
RDW: 14.4 % (ref 11.5–14.5)
WBC: 6.9 10*3/uL (ref 3.6–11.0)

## 2016-01-13 MED ORDER — SENNOSIDES-DOCUSATE SODIUM 8.6-50 MG PO TABS
2.0000 | ORAL_TABLET | Freq: Every day | ORAL | Status: DC
  Administered 2016-01-13: 2 via ORAL
  Filled 2016-01-13 (×2): qty 2

## 2016-01-13 MED ORDER — BISACODYL 10 MG RE SUPP
10.0000 mg | Freq: Once | RECTAL | Status: AC
  Administered 2016-01-13: 10 mg via RECTAL
  Filled 2016-01-13: qty 1

## 2016-01-13 MED ORDER — PANTOPRAZOLE SODIUM 40 MG PO TBEC
40.0000 mg | DELAYED_RELEASE_TABLET | Freq: Every day | ORAL | Status: DC
  Administered 2016-01-13 – 2016-01-15 (×3): 40 mg via ORAL
  Filled 2016-01-13 (×4): qty 1

## 2016-01-13 NOTE — Progress Notes (Signed)
[facility] - [place] [facility]   PATIENT NAME: [name]    MR#:  [id number]  DATE OF BIRTH:  [birth year]  SUBJECTIVE:  CHIEF COMPLAINT:   Chief Complaint  Patient presents with  . Abdominal Pain  . Emesis  . Shortness of Breath   - On 2 L oxygen at this time. Not on home oxygen. -Improving NG output. Passing flatness. No bowel movement yet. KUB with improvement in obstruction but stool burden noted  REVIEW OF SYSTEMS:  Review of Systems  Constitutional: Negative for chills, fever and malaise/fatigue.  HENT: Negative for ear discharge, ear pain, hearing loss and nosebleeds.   Eyes: Negative for blurred vision, double vision and photophobia.  Respiratory: Positive for shortness of breath. Negative for cough, hemoptysis and wheezing.   Cardiovascular: Negative for chest pain, palpitations, orthopnea and leg swelling.  Gastrointestinal: Positive for constipation. Negative for abdominal pain, diarrhea, melena, nausea and vomiting.  Genitourinary: Negative for dysuria.  Musculoskeletal: Negative for myalgias and neck pain.  Skin: Negative for rash.  Neurological: Negative for dizziness, sensory change, speech change, focal weakness and headaches.  Endo/Heme/Allergies: Does not bruise/bleed easily.  Psychiatric/Behavioral: Negative for depression.    DRUG ALLERGIES:   Allergies  Allergen Reactions  . Toprol Xl [Metoprolol] Other (See Comments)    Reaction: skin pulls away from the bottom of feet  . Ace Inhibitors Cough    VITALS:  Blood pressure (!) 116/50, pulse 83, temperature 97.7 F (36.5 C), temperature source Oral, resp. rate 16, height 5\' 3"  (1.6 m), weight 76.3 kg (168 lb 3.2 oz), SpO2 99 %.  PHYSICAL EXAMINATION:  Physical Exam  GENERAL:  82 y.o.-year-old patient lying in the bed with no acute distress.  EYES: Pupils equal, round, reactive to light and accommodation. No scleral icterus. Extraocular muscles intact.  HEENT: Head  atraumatic, normocephalic. Oropharynx and nasopharynx clear.  NECK:  Supple, no jugular venous distention. No thyroid enlargement, no tenderness.  LUNGS: Normal breath sounds bilaterally, no wheezing, rales,rhonchi or crepitation. No use of accessory muscles of respiration. Diminished bibasilar breath sounds. CARDIOVASCULAR: S1, S2 normal. No rubs, or gallops. 3/6 systolic murmur is present ABDOMEN: Soft, distended with hernia infraumbilical from previous surgical scars, nontender, . Bowel sounds present. No organomegaly or mass.  EXTREMITIES: No pedal edema, cyanosis, or clubbing.  NEUROLOGIC: Cranial nerves II through XII are intact. Muscle strength 5/5 in all extremities. Sensation intact. Gait not checked.  PSYCHIATRIC: The patient is alert and oriented x 3.  SKIN: No obvious rash, lesion, or ulcer.    LABORATORY PANEL:   CBC  Recent Labs Lab 01/13/16 0526  WBC 6.9  HGB 10.0*  HCT 30.3*  PLT 156   ------------------------------------------------------------------------------------------------------------------  Chemistries   Recent Labs Lab 01/11/16 0716 01/12/16 0607 01/13/16 0526  NA 138 140 141  K 4.6 5.0 3.6  CL 99* 100* 101  CO2 29 30 34*  GLUCOSE 183* 113* 88  BUN 40* 30* 23*  CREATININE 1.23* 1.01* 0.73  CALCIUM 9.5 8.8* 8.4*  MG  --  2.1  --   AST 29  --   --   ALT 18  --   --   ALKPHOS 57  --   --   BILITOT 0.6  --   --    ------------------------------------------------------------------------------------------------------------------  Cardiac Enzymes No results for input(s): TROPONINI in the last 168 hours. ------------------------------------------------------------------------------------------------------------------  RADIOLOGY:  Dg Abd Portable 1v  Result Date: 01/11/2016 CLINICAL DATA:  Nasogastric tube placement. EXAM: PORTABLE ABDOMEN -  1 VIEW COMPARISON:  None. FINDINGS: AP view of the lower chest and upper abdomen. Nasogastric tube tip  projects in mid stomach, side port projecting in proximal stomach. Bibasilar strandy densities/atelectasis. LEFT apical bullous changes. Cardiac defibrillator in situ. Included bowel gas pattern is nondilated and nonobstructive. IMPRESSION: Nasogastric tube tip projects in mid stomach. Electronically Signed   By: Awilda Metroourtnay  Bloomer M.D.   On: 01/11/2016 21:25   Dg Abd Portable 2v  Result Date: 01/13/2016 CLINICAL DATA:  Small bowel obstruction EXAM: PORTABLE ABDOMEN - 2 VIEW COMPARISON:  01/12/2016 FINDINGS: NG tube is in the stomach. Large stool burden throughout the colon. No dilated small bowel loops currently. No free air organomegaly. IMPRESSION: Improving small bowel obstruction pattern. Large stool burden in the colon. Electronically Signed   By: Charlett NoseKevin  Dover M.D.   On: 01/13/2016 07:21   Dg Abd Portable 2v  Result Date: 01/12/2016 CLINICAL DATA:  Small bowel obstruction EXAM: PORTABLE ABDOMEN - 2 VIEW COMPARISON:  01/11/2016 FINDINGS: NG tube extends the stomach. Multiple cardiac leads unchanged. Dilated loop of small bowel in the central abdomen measuring 4 cm not significantly changed. Gas and stool present throughout the colon. The rectum is not well evaluated due to contrast in the bladder. IMPRESSION: 1. NG tube in stomach. 2. No evidence of high-grade bowel obstruction. 3. Persistent dilated loops small bowel in the mid abdomen. Electronically Signed   By: Genevive BiStewart  Edmunds M.D.   On: 01/12/2016 09:48    EKG:   Orders placed or performed during the hospital encounter of 10/11/14  . EKG 12-Lead  . EKG 12-Lead  . EKG 12-Lead  . EKG 12-Lead  . EKG    ASSESSMENT AND PLAN:   82 year old female with past medical history significant for A. fib status post pacemaker, not an anticoagulation, anemia, arthritis, multiple abdominal surgeries, history of ulcerative colitis and diverticulosis, cardiomyopathy and congestive heart failure, COPD not on home oxygen presents secondary to abdominal  pain and nausea, vomiting.  #1 small bowel obstruction-conservatory treatment. Appreciate surgical input. -NG tube and anti-emetics. NG suction on hold for now. KUB with improvement. Possible removal of NG tube later today -Remains nothing by mouth until then.  #2 acute hypoxia-secondary to COPD and underlying CHF. No exacerbation noted. -Continue to wean oxygen. Now on 2 L. Continue inhalers and nebulizers. -Encouraged incentive spirometry  #3 cardiomyopathy with congestive heart failure-unknown type. No recent echocardiogram done. Per cardiology notes, patient has non-ischemic cardiomyopathy. -Stop IV fluids today. Continue Lasix. Hold losartan for now -She has pacemaker for history of complete heart block.  #4 history of atrial fibrillation-on digoxin. Rate controlled. -History of complete heart block status post pacemaker. -No anticoagulation due to patient's decision  #5 DVT prophylaxis-on Lovenox  #6 constipation- KUB showing significant stool burden. Dulcolax suppository ordered. -We'll start senna Colace   Physical therapy consult. Ambulates with walker at baseline. Updated Dr. Michela PitcherEly and also patient's son at bedside   All the records are reviewed and case discussed with Care Management/Social Workerr. Management plans discussed with the patient, family and they are in agreement.  CODE STATUS: Full code  TOTAL TIME TAKING CARE OF THIS PATIENT: 37 minutes.   POSSIBLE D/C TOMORROW, DEPENDING ON CLINICAL CONDITION.   Enid BaasKALISETTI,Una Yeomans M.D on 01/13/2016 at 11:38 AM  Between 7am to 6pm - Pager - [phone]  After 6pm go to [email or web address] - Social research officer, governmentpassword EPAS [facility]  [facility]  Office  [phone]  CC: Primary care physician; Lynnea FerrierBERT J KLEIN III, MD

## 2016-01-13 NOTE — Progress Notes (Signed)
Subjective:   The patient is well known to me from her previous admissions. She says she feels much better this morning after 24 hours of nasogastric suction. She does not have any significant abdominal pain this morning. She still is passing gas. She is not nauseated. Plain films showed improvement in her gas pattern. She does have significant stool burden. She's breathing comfortably with no significant shortness of breath.  Vital signs in last 24 hours: Temp:  [97.7 F (36.5 C)-99.1 F (37.3 C)] 97.7 F (36.5 C) (11/18 0455) Pulse Rate:  [80-87] 83 (11/18 0455) Resp:  [16] 16 (11/18 0455) BP: (109-125)/(43-78) 116/50 (11/18 0455) SpO2:  [90 %-99 %] 99 % (11/18 0455) Last BM Date: 01/09/16  Intake/Output from previous day: 11/17 0701 - 11/18 0700 In: 1752 [I.V.:1642; NG/GT:110] Out: 3150 [Urine:1900; Emesis/NG output:1250]  Exam:  Her abdomen is marginally distended but is still soft. She has hypoactive but present bowel sounds. No rebound or guarding problems are noted.  Lab Results:  CBC  Recent Labs  01/12/16 0607 01/13/16 0526  WBC 9.4 6.9  HGB 10.3* 10.0*  HCT 30.2* 30.3*  PLT 173 156   CMP     Component Value Date/Time   NA 141 01/13/2016 0526   NA 134 (L) 05/22/2012 0412   K 3.6 01/13/2016 0526   K 4.5 05/22/2012 0412   CL 101 01/13/2016 0526   CL 102 05/22/2012 0412   CO2 34 (H) 01/13/2016 0526   CO2 26 05/22/2012 0412   GLUCOSE 88 01/13/2016 0526   GLUCOSE 141 (H) 05/22/2012 0412   BUN 23 (H) 01/13/2016 0526   BUN 60 (H) 05/22/2012 0412   CREATININE 0.73 01/13/2016 0526   CREATININE 1.26 05/22/2012 0412   CALCIUM 8.4 (L) 01/13/2016 0526   CALCIUM 8.6 05/22/2012 0412   PROT 7.8 01/11/2016 0716   PROT 7.3 05/22/2012 0412   ALBUMIN 4.1 01/11/2016 0716   ALBUMIN 3.1 (L) 05/22/2012 0412   AST 29 01/11/2016 0716   AST 37 05/22/2012 0412   ALT 18 01/11/2016 0716   ALT 24 05/22/2012 0412   ALKPHOS 57 01/11/2016 0716   ALKPHOS 74 05/22/2012 0412    BILITOT 0.6 01/11/2016 0716   BILITOT 0.5 05/22/2012 0412   GFRNONAA >60 01/13/2016 0526   GFRNONAA 40 (L) 05/22/2012 0412   GFRAA >60 01/13/2016 0526   GFRAA 46 (L) 05/22/2012 0412   PT/INR No results for input(s): LABPROT, INR in the last 72 hours.  Studies/Results: Ct Abdomen Pelvis W Contrast  Result Date: 01/11/2016 CLINICAL DATA:  Diffuse abdominal pain with nausea and vomiting. History of small bowel obstruction. EXAM: CT ABDOMEN AND PELVIS WITH CONTRAST TECHNIQUE: Multidetector CT imaging of the abdomen and pelvis was performed using the standard protocol following bolus administration of intravenous contrast. CONTRAST:  75mL ISOVUE-300 IOPAMIDOL (ISOVUE-300) INJECTION 61% COMPARISON:  10/11/2014 FINDINGS: Lower chest: Mild scarring.  No active process. Hepatobiliary: Intra and extra hepatic ductal dilatation again noted. No significant liver parenchymal lesion. Previous cholecystectomy. Pancreas: Fatty atrophy.  No active process. Spleen: Normal Adrenals/Urinary Tract: Adrenal glands are normal. Kidneys are slightly small with some benign appearing cysts. Stomach/Bowel: Dilated stomach and small intestine 2D jejunoileal junction where there is a transition zone indicating small bowel obstruction. The patient has had previous small bowel surgery. Vascular/Lymphatic: Aortic atherosclerosis. Atherosclerosis of the aortic branch vessels. Maximal diameter of the infrarenal aorta 2.4 cm. IVC is normal. No retroperitoneal lymphadenopathy. Reproductive: Previous hysterectomy. Other: No free fluid or air. Ventral hernia containing fat  and nonobstructed small intestine. Musculoskeletal: Advanced chronic degenerative changes of the lumbar spine. Partial compression fractures of L2 and L3, slightly progressed at L3 compared to the study of August 2016. IMPRESSION: Recurrent high-grade small bowel obstruction, probably at the jejunal ileal junction. No evidence of perforation. Chronic intra and extra  hepatic biliary duct dilatation. Aortic atherosclerosis. Partial compression fractures L2 and L3, slightly progressive at L3 since August of 2016. Ventral hernia containing fat and nonobstructed small intestine. Electronically Signed   By: Paulina Fusi M.D.   On: 01/11/2016 10:30   Dg Abd Portable 1v  Result Date: 01/11/2016 CLINICAL DATA:  Nasogastric tube placement. EXAM: PORTABLE ABDOMEN - 1 VIEW COMPARISON:  None. FINDINGS: AP view of the lower chest and upper abdomen. Nasogastric tube tip projects in mid stomach, side port projecting in proximal stomach. Bibasilar strandy densities/atelectasis. LEFT apical bullous changes. Cardiac defibrillator in situ. Included bowel gas pattern is nondilated and nonobstructive. IMPRESSION: Nasogastric tube tip projects in mid stomach. Electronically Signed   By: Awilda Metro M.D.   On: 01/11/2016 21:25   Dg Abd Portable 2v  Result Date: 01/13/2016 CLINICAL DATA:  Small bowel obstruction EXAM: PORTABLE ABDOMEN - 2 VIEW COMPARISON:  01/12/2016 FINDINGS: NG tube is in the stomach. Large stool burden throughout the colon. No dilated small bowel loops currently. No free air organomegaly. IMPRESSION: Improving small bowel obstruction pattern. Large stool burden in the colon. Electronically Signed   By: Charlett Nose M.D.   On: 01/13/2016 07:21   Dg Abd Portable 2v  Result Date: 01/12/2016 CLINICAL DATA:  Small bowel obstruction EXAM: PORTABLE ABDOMEN - 2 VIEW COMPARISON:  01/11/2016 FINDINGS: NG tube extends the stomach. Multiple cardiac leads unchanged. Dilated loop of small bowel in the central abdomen measuring 4 cm not significantly changed. Gas and stool present throughout the colon. The rectum is not well evaluated due to contrast in the bladder. IMPRESSION: 1. NG tube in stomach. 2. No evidence of high-grade bowel obstruction. 3. Persistent dilated loops small bowel in the mid abdomen. Electronically Signed   By: Genevive Bi M.D.   On: 01/12/2016  09:48    Assessment/Plan: I reviewed her plain films. She's had multiple small bowel obstructions in the past that have resolved with decompression. I will stop the nasogastric suction but leave the tube in place for a few hours and see how she does. If she does not have any increase her symptoms we will go on and remove her tube and begin to feed her. I discussed this plan with her son and the patient. They are in agreement.

## 2016-01-13 NOTE — Evaluation (Signed)
Physical Therapy Evaluation Patient Details Name: [name] MRN: [id number] DOB: [birth year] Today's Date: 01/13/2016   History of Present Illness  [name] is a 82 y.o. female that is well known to the surgery department presents to the emergency department with complaints of abdominal pain, nausea, vomiting. This has happened numerous times before. This attack initially started with her not feeling well over the last 2 weeks however last 24 hours her abdominal pain worsened to the point where she decided to come the ER for further evaluation and treatment. Her last admission for bowel obstruction was August of last year. She's had numerous abdominal surgeries including bowel resections and hernia repairs. She states she has felt ill but denies any fevers or chills. She has chronic shortness of breath which is unchanged, she denies any chest pain, diarrhea, constipation. She's also had some dysuria that she was seen for in the clinic recently with a negative UA  Clinical Impression  Pt admitted with above diagnosis. Pt currently with functional limitations due to the deficits listed below (see PT Problem List). Pt with general deconditioning and deficits with her balance. She ambulates with significant forward trunk lean, partially corrected with cues for upright posture. Significant kyphosis remains. Intermittent LE buckling during ambulation and pt requires seated rest break after 60' due to fatigue. Upon arrival at patients room she is on 2L/min O2 and SaO2 at 83%. Requires 4L/min O2 for activity and SaO2 continues to drop to 87-88% and only recovers with seated rest break during ambulation. RN notified. Pt reports she is not on supplemental O2 at baseline. Pt would benefit from SNF placement at discharge however she is currently refusing. If she continues to refuse please arrange [facility] PT. Encouraged pt to use her rolling walker and not her rollator when she gets home for additional UE support  with LE buckling. Pt will benefit from skilled PT services to address deficits in strength, balance, and mobility in order to return to full function at home.     Follow Up Recommendations SNF;Other (comment) (Pt refuses)    Equipment Recommendations  None recommended by PT;Other (comment) (Should use her rolling walker and not rollator initially )    Recommendations for Other Services Rehab consult     Precautions / Restrictions Precautions Precautions: Fall Restrictions Weight Bearing Restrictions: No      Mobility  Bed Mobility Overal bed mobility: Needs Assistance Bed Mobility: Supine to Sit     Supine to sit: Supervision     General bed mobility comments: Pt demonstrates fair speed and sequencing with HOB elevated and use of bed rails  Transfers Overall transfer level: Needs assistance Equipment used: 4-wheeled walker Transfers: Sit to/from Stand Sit to Stand: Min guard         General transfer comment: Pt demonstrates fair speed however attempts to pull up on rolling walker. Poor balance noted with posterior leaning requiring cues for safety in standing  Ambulation/Gait Ambulation/Gait assistance: Min guard Ambulation Distance (Feet): 120 Feet Assistive device: 4-wheeled walker Gait Pattern/deviations: Decreased step length - right;Decreased step length - left Gait velocity: Decreased but functional for limited household mobility   General Gait Details: Pt ambulates with significant forward trunk lean, partially corrected with cues for upright posture. Intermittent LE buckling during ambulation and pt requires seated rest break after 60' due to fatigue. Upon arrival pt on 2L/min O2 and SaO2 at 83%. Requires 4L/min O2 for activity and SaO2 continues to drop to 87-88% and only recovers with  seated rest break during ambulation. RN notified. Pt reports she is not on supplemental O2 at baseline. Pt requires cues and assist for safe turns  Stairs             Wheelchair Mobility    Modified Rankin (Stroke Patients Only)       Balance Overall balance assessment: Needs assistance Sitting-balance support: No upper extremity supported Sitting balance-Leahy Scale: Good Sitting balance - Comments: No sitting balance deficits noted   Standing balance support: No upper extremity supported Standing balance-Leahy Scale: Fair Standing balance comment: Able to maintain wide stance but with all dynamic activities requires UE support                             Pertinent Vitals/Pain Pain Assessment: No/denies pain    Home Living Family/patient expects to be discharged to:: Private residence Living Arrangements: Alone Available Help at Discharge: Family Type of Home: House Home Access: Ramped entrance     Home Layout: One level Home Equipment: Environmental consultantWalker - 4 wheels;Walker - 2 wheels;Bedside commode;Cane - single point Additional Comments: Has fall alert bracelet    Prior Function Level of Independence: Needs assistance   Gait / Transfers Assistance Needed: Independent for ambulation with rollator.   ADL's / Homemaking Assistance Needed: Independent with ADLs, grandson and son assist with IADLs        Hand Dominance   Dominant Hand: Right    Extremity/Trunk Assessment   Upper Extremity Assessment: Overall WFL for tasks assessed           Lower Extremity Assessment: Generalized weakness         Communication   Communication: No difficulties  Cognition Arousal/Alertness: Awake/alert Behavior During Therapy: WFL for tasks assessed/performed Overall Cognitive Status: Within Functional Limits for tasks assessed                      General Comments      Exercises     Assessment/Plan    PT Assessment Patient needs continued PT services  PT Problem List Decreased strength;Decreased activity tolerance;Decreased balance;Cardiopulmonary status limiting activity;Decreased safety awareness           PT Treatment Interventions Gait training;DME instruction;Therapeutic activities;Therapeutic exercise;Balance training;Neuromuscular re-education;Patient/family education    PT Goals (Current goals can be found in the Care Plan section)  Acute Rehab PT Goals Patient Stated Goal: "I want to go home" PT Goal Formulation: With patient Time For Goal Achievement: 01/27/16 Potential to Achieve Goals: Fair    Frequency Min 2X/week   Barriers to discharge Decreased caregiver support Lives alone    Co-evaluation               End of Session Equipment Utilized During Treatment: Gait belt;Oxygen Activity Tolerance: Patient tolerated treatment well Patient left: in chair;with call bell/phone within reach;with chair alarm set Nurse Communication: Mobility status;Other (comment) (SaO2 concerns)         Time: 1040-1108 PT Time Calculation (min) (ACUTE ONLY): 28 min   Charges:   PT Evaluation $PT Eval Low Complexity: 1 Procedure PT Treatments $Gait Training: 8-22 mins   PT G Codes:       Sharalyn InkJason D Huprich PT, DPT   Huprich,Jason 01/13/2016, 12:49 PM

## 2016-01-13 NOTE — Progress Notes (Signed)
Patient is very proactive in regards to daily care and preventative measures. Patient got up to [facility] with stand-by assist and had small bowel movement. Then utilized incentive spirometer, upon return to bed. Pt resting in bed continue to assess.

## 2016-01-14 ENCOUNTER — Inpatient Hospital Stay: Payer: Medicare Other

## 2016-01-14 LAB — BASIC METABOLIC PANEL
Anion gap: 10 (ref 5–15)
BUN: 23 mg/dL — AB (ref 6–20)
CALCIUM: 8.6 mg/dL — AB (ref 8.9–10.3)
CO2: 31 mmol/L (ref 22–32)
Chloride: 99 mmol/L — ABNORMAL LOW (ref 101–111)
Creatinine, Ser: 0.74 mg/dL (ref 0.44–1.00)
GFR calc Af Amer: >60 mL/min (ref >60)
GLUCOSE: 66 mg/dL (ref 65–99)
Potassium: 3.3 mmol/L — ABNORMAL LOW (ref 3.5–5.1)
Sodium: 140 mmol/L (ref 135–145)

## 2016-01-14 MED ORDER — POTASSIUM CHLORIDE 20 MEQ PO PACK
20.0000 meq | PACK | Freq: Two times a day (BID) | ORAL | Status: DC
  Administered 2016-01-14 – 2016-01-15 (×3): 20 meq via ORAL
  Filled 2016-01-14 (×3): qty 1

## 2016-01-14 MED ORDER — POLYETHYLENE GLYCOL 3350 17 G PO PACK
17.0000 g | PACK | Freq: Every day | ORAL | Status: DC
  Administered 2016-01-14: 17 g via ORAL
  Filled 2016-01-14 (×2): qty 1

## 2016-01-14 NOTE — Progress Notes (Signed)
[employer] - [place] at [facility]   PATIENT NAME: [name]    MR#:  [id number]  DATE OF BIRTH:  Aug 07, 1927  SUBJECTIVE:  CHIEF COMPLAINT:   Chief Complaint  Patient presents with  . Abdominal Pain  . Emesis  . Shortness of Breath   - On 2 L oxygen at this time. Not on home oxygen. -Improving NG output. Passing flatness. No bowel movement yet. KUB with improvement in obstruction but stool burden noted  REVIEW OF SYSTEMS:  Review of Systems  Constitutional: Negative for chills, fever and malaise/fatigue.  HENT: Negative for ear discharge, ear pain, hearing loss and nosebleeds.   Eyes: Negative for blurred vision, double vision and photophobia.  Respiratory: Positive for shortness of breath. Negative for cough, hemoptysis and wheezing.   Cardiovascular: Negative for chest pain, palpitations, orthopnea and leg swelling.  Gastrointestinal: Positive for constipation. Negative for abdominal pain, diarrhea, melena, nausea and vomiting.  Genitourinary: Negative for dysuria.  Musculoskeletal: Negative for myalgias and neck pain.  Skin: Negative for rash.  Neurological: Negative for dizziness, sensory change, speech change, focal weakness and headaches.  Endo/Heme/Allergies: Does not bruise/bleed easily.  Psychiatric/Behavioral: Negative for depression.    DRUG ALLERGIES:   Allergies  Allergen Reactions  . Toprol Xl [Metoprolol] Other (See Comments)    Reaction: skin pulls away from the bottom of feet  . Ace Inhibitors Cough    VITALS:  Blood pressure (!) 118/96, pulse 89, temperature 98.2 F (36.8 C), temperature source Oral, resp. rate 18, height 5\' 3"  (1.6 m), weight 76.3 kg (168 lb 3.2 oz), SpO2 96 %.  PHYSICAL EXAMINATION:  Physical Exam  GENERAL:  82 y.o.-year-old patient lying in the bed with no acute distress.  EYES: Pupils equal, round, reactive to light and accommodation. No scleral icterus. Extraocular muscles intact.  HEENT: Head  atraumatic, normocephalic. Oropharynx and nasopharynx clear.  NECK:  Supple, no jugular venous distention. No thyroid enlargement, no tenderness.  LUNGS: Normal breath sounds bilaterally, no wheezing, rales,rhonchi or crepitation. No use of accessory muscles of respiration. Diminished bibasilar breath sounds. CARDIOVASCULAR: S1, S2 normal. No rubs, or gallops. 3/6 systolic murmur is present ABDOMEN: Soft, distended with hernia infraumbilical from previous surgical scars, nontender, . Bowel sounds present. No organomegaly or mass.  EXTREMITIES: No pedal edema, cyanosis, or clubbing.  NEUROLOGIC: Cranial nerves II through XII are intact. Muscle strength 5/5 in all extremities. Sensation intact. Gait not checked.  PSYCHIATRIC: The patient is alert and oriented x 3.  SKIN: No obvious rash, lesion, or ulcer.    LABORATORY PANEL:   CBC  Recent Labs Lab 01/13/16 0526  WBC 6.9  HGB 10.0*  HCT 30.3*  PLT 156   ------------------------------------------------------------------------------------------------------------------  Chemistries   Recent Labs Lab 01/11/16 0716 01/12/16 0607  01/14/16 0539  NA 138 140  < > 140  K 4.6 5.0  < > 3.3*  CL 99* 100*  < > 99*  CO2 29 30  < > 31  GLUCOSE 183* 113*  < > 66  BUN 40* 30*  < > 23*  CREATININE 1.23* 1.01*  < > 0.74  CALCIUM 9.5 8.8*  < > 8.6*  MG  --  2.1  --   --   AST 29  --   --   --   ALT 18  --   --   --   ALKPHOS 57  --   --   --   BILITOT 0.6  --   --   --   < > =  values in this interval not displayed. ------------------------------------------------------------------------------------------------------------------  Cardiac Enzymes No results for input(s): TROPONINI in the last 168 hours. ------------------------------------------------------------------------------------------------------------------  RADIOLOGY:  Dg Abd 2 Views  Result Date: 01/14/2016 CLINICAL DATA:  Followup of small bowel obstruction. EXAM: ABDOMEN  - 2 VIEW COMPARISON:  One day prior FINDINGS: Two supine views. Pacer/ AICD device. Resolution of small bowel distension. No gross free intraperitoneal air. Large volume descending colonic stool. Extensive surgical sutures. Probable vascular calcifications about the upper abdomen. Low pelvis excluded. Osteopenia with convex left lumbar spine curvature. IMPRESSION: Resolution of small bowel obstruction.  Possible constipation. Electronically Signed   By: Jeronimo Greaves M.D.   On: 01/14/2016 08:10   Dg Abd Portable 2v  Result Date: 01/13/2016 CLINICAL DATA:  Small bowel obstruction EXAM: PORTABLE ABDOMEN - 2 VIEW COMPARISON:  01/12/2016 FINDINGS: NG tube is in the stomach. Large stool burden throughout the colon. No dilated small bowel loops currently. No free air organomegaly. IMPRESSION: Improving small bowel obstruction pattern. Large stool burden in the colon. Electronically Signed   By: Charlett Nose M.D.   On: 01/13/2016 07:21    EKG:   Orders placed or performed during the hospital encounter of 10/11/14  . EKG 12-Lead  . EKG 12-Lead  . EKG 12-Lead  . EKG 12-Lead  . EKG    ASSESSMENT AND PLAN:   82 year old female with past medical history significant for A. fib status post pacemaker, not an anticoagulation, anemia, arthritis, multiple abdominal surgeries, history of ulcerative colitis and diverticulosis, cardiomyopathy and congestive heart failure, COPD not on home oxygen presents secondary to abdominal pain and nausea, vomiting.  #1 small bowel obstruction-conservatory treatment. Appreciate surgical input. -NG tube discontinued and anti-emetics. KUB with improvement. Had bowel movement - started on clear liquids today  #2 acute hypoxia-secondary to COPD and underlying CHF. No exacerbation noted. -Continue to wean oxygen. Now on 2 L. Continue inhalers and nebulizers. -Encouraged incentive spirometry  #3 cardiomyopathy with congestive heart failure-unknown type. No recent  echocardiogram done. Per cardiology notes, patient has non-ischemic cardiomyopathy. -Continue Lasix. Hold losartan for now -She has pacemaker for history of complete heart block.  #4 history of atrial fibrillation-on digoxin. Rate controlled. -History of complete heart block status post pacemaker. -No anticoagulation due to patient's decision  #5 DVT prophylaxis-on Lovenox  #6 constipation- KUB showing significant stool burden. Dulcolax suppository ordered. relieving -also started senna Colace   Physical therapy consult. Ambulates with walker at baseline. Refuses to go to rehab. Medically stable, please discharge on home meds. WILL SIGN OFF AND CALL WITH ANY CONCERNS. THANK YOU   All the records are reviewed and case discussed with Care Management/Social Workerr. Management plans discussed with the patient, family and they are in agreement.  CODE STATUS: Full code  TOTAL TIME TAKING CARE OF THIS PATIENT: 37 minutes.   POSSIBLE D/C TOMORROW, DEPENDING ON CLINICAL CONDITION.   Enid Baas M.D on 01/14/2016 at 9:43 AM  Between 7am to 6pm - Pager - [phone]  After 6pm go to [email or web address] - Social research officer, government  [facility]  Office  [phone]  CC: Primary care physician; Lynnea Ferrier, MD

## 2016-01-14 NOTE — Progress Notes (Signed)
Subjective:   She is feeling much better today. She's not nauseated. She has had 2 small bowel movements. Her plain films show resolution of her small bowel obstruction and she has tolerated having her nasogastric tube removed.  Vital signs in last 24 hours: Temp:  [97.9 F (36.6 C)-98.8 F (37.1 C)] 98.2 F (36.8 C) (11/19 0527) Pulse Rate:  [57-90] 89 (11/19 0909) Resp:  [16-18] 18 (11/19 0527) BP: (118-153)/(66-96) 118/96 (11/19 0527) SpO2:  [83 %-96 %] 96 % (11/19 0527) Last BM Date: 01/13/16  Intake/Output from previous day: 11/18 0701 - 11/19 0700 In: 262 [I.V.:262] Out: 1600 [Urine:1600]  Exam:  Her abdomen is soft. She has good bowel sounds. She's breathing easily with no respiratory insufficiency no retraction. She has no adventitious sounds.  Lab Results:  CBC  Recent Labs  01/12/16 0607 01/13/16 0526  WBC 9.4 6.9  HGB 10.3* 10.0*  HCT 30.2* 30.3*  PLT 173 156   CMP     Component Value Date/Time   NA 140 01/14/2016 0539   NA 134 (L) 05/22/2012 0412   K 3.3 (L) 01/14/2016 0539   K 4.5 05/22/2012 0412   CL 99 (L) 01/14/2016 0539   CL 102 05/22/2012 0412   CO2 31 01/14/2016 0539   CO2 26 05/22/2012 0412   GLUCOSE 66 01/14/2016 0539   GLUCOSE 141 (H) 05/22/2012 0412   BUN 23 (H) 01/14/2016 0539   BUN 60 (H) 05/22/2012 0412   CREATININE 0.74 01/14/2016 0539   CREATININE 1.26 05/22/2012 0412   CALCIUM 8.6 (L) 01/14/2016 0539   CALCIUM 8.6 05/22/2012 0412   PROT 7.8 01/11/2016 0716   PROT 7.3 05/22/2012 0412   ALBUMIN 4.1 01/11/2016 0716   ALBUMIN 3.1 (L) 05/22/2012 0412   AST 29 01/11/2016 0716   AST 37 05/22/2012 0412   ALT 18 01/11/2016 0716   ALT 24 05/22/2012 0412   ALKPHOS 57 01/11/2016 0716   ALKPHOS 74 05/22/2012 0412   BILITOT 0.6 01/11/2016 0716   BILITOT 0.5 05/22/2012 0412   GFRNONAA >60 01/14/2016 0539   GFRNONAA 40 (L) 05/22/2012 0412   GFRAA >60 01/14/2016 0539   GFRAA 46 (L) 05/22/2012 0412   PT/INR No results for input(s):  LABPROT, INR in the last 72 hours.  Studies/Results: Dg Abd 2 Views  Result Date: 01/14/2016 CLINICAL DATA:  Followup of small bowel obstruction. EXAM: ABDOMEN - 2 VIEW COMPARISON:  One day prior FINDINGS: Two supine views. Pacer/ AICD device. Resolution of small bowel distension. No gross free intraperitoneal air. Large volume descending colonic stool. Extensive surgical sutures. Probable vascular calcifications about the upper abdomen. Low pelvis excluded. Osteopenia with convex left lumbar spine curvature. IMPRESSION: Resolution of small bowel obstruction.  Possible constipation. Electronically Signed   By: Jeronimo Greaves M.D.   On: 01/14/2016 08:10   Dg Abd Portable 2v  Result Date: 01/13/2016 CLINICAL DATA:  Small bowel obstruction EXAM: PORTABLE ABDOMEN - 2 VIEW COMPARISON:  01/12/2016 FINDINGS: NG tube is in the stomach. Large stool burden throughout the colon. No dilated small bowel loops currently. No free air organomegaly. IMPRESSION: Improving small bowel obstruction pattern. Large stool burden in the colon. Electronically Signed   By: Charlett Nose M.D.   On: 01/13/2016 07:21    Assessment/Plan: Overall she's markedly improved. She's following the pattern of her previous obstructions. Her potassium is slightly low-dose we will replete that. I'll start her on liquid diet. Tentatively we will plan discharge for tomorrow.

## 2016-01-15 LAB — BASIC METABOLIC PANEL
Anion gap: 7 (ref 5–15)
BUN: 26 mg/dL — AB (ref 6–20)
CALCIUM: 8.2 mg/dL — AB (ref 8.9–10.3)
CO2: 33 mmol/L — ABNORMAL HIGH (ref 22–32)
CREATININE: 0.89 mg/dL (ref 0.44–1.00)
Chloride: 98 mmol/L — ABNORMAL LOW (ref 101–111)
GFR calc Af Amer: >60 mL/min (ref >60)
GFR, EST NON AFRICAN AMERICAN: 57 mL/min — AB (ref >60)
GLUCOSE: 109 mg/dL — AB (ref 65–99)
Potassium: 3.8 mmol/L (ref 3.5–5.1)
SODIUM: 138 mmol/L (ref 135–145)

## 2016-01-15 NOTE — Progress Notes (Signed)
CC small bowel obstruction Subjective: Patient states that she has no symptoms today she's had multiple bowel movements no further nausea vomiting and no abdominal pain she wants to advance her diet and she thinks she is ready to go home.  Objective: Vital signs in last 24 hours: Temp:  [97.9 F (36.6 C)-98.2 F (36.8 C)] 97.9 F (36.6 C) (11/20 0516) Pulse Rate:  [82-97] 82 (11/20 0516) Resp:  [16-20] 18 (11/20 0516) BP: (108-128)/(53-70) 128/70 (11/20 0516) SpO2:  [92 %-96 %] 96 % (11/20 0516) Last BM Date: 01/14/16  Intake/Output from previous day: 11/19 0701 - 11/20 0700 In: 990 [P.O.:990] Out: 1000 [Urine:1000] Intake/Output this shift: No intake/output data recorded.  Physical exam:  Abdomen is soft nontender nondistended nontympanitic calves are nontender awake alert oriented vital signs are reviewed  Lab Results: CBC   Recent Labs  01/13/16 0526  WBC 6.9  HGB 10.0*  HCT 30.3*  PLT 156   BMET  Recent Labs  01/14/16 0539 01/15/16 0523  NA 140 138  K 3.3* 3.8  CL 99* 98*  CO2 31 33*  GLUCOSE 66 109*  BUN 23* 26*  CREATININE 0.74 0.89  CALCIUM 8.6* 8.2*   PT/INR No results for input(s): LABPROT, INR in the last 72 hours. ABG No results for input(s): PHART, HCO3 in the last 72 hours.  Invalid input(s): PCO2, PO2  Studies/Results: Dg Abd 2 Views  Result Date: 01/14/2016 CLINICAL DATA:  Followup of small bowel obstruction. EXAM: ABDOMEN - 2 VIEW COMPARISON:  One day prior FINDINGS: Two supine views. Pacer/ AICD device. Resolution of small bowel distension. No gross free intraperitoneal air. Large volume descending colonic stool. Extensive surgical sutures. Probable vascular calcifications about the upper abdomen. Low pelvis excluded. Osteopenia with convex left lumbar spine curvature. IMPRESSION: Resolution of small bowel obstruction.  Possible constipation. Electronically Signed   By: Jeronimo Greaves M.D.   On: 01/14/2016 08:10     Anti-infectives: Anti-infectives    None      Assessment/Plan:  Small bowel obstruction which has resolved. Will advance diet and likely discharge later today.  Lattie Haw, MD, FACS  01/15/2016

## 2016-01-15 NOTE — Discharge Summary (Signed)
Physician Discharge Summary  Patient ID: [name] MRN: [id number] DOB/AGE: Mar 30, 1927 82 y.o.  Admit date: 01/11/2016 Discharge date: 01/15/2016   Discharge Diagnoses:  Active Problems:   SBO (small bowel obstruction)   ProceduresNone  Hospital Course: This patient admitted the hospital with diagnosis of partial small bowel obstruction. She was admitted with nasogastric suction and observation and her symptoms resolved she is currently having bowel movements passing gas tolerating a soft diet which will be advanced prior to discharge. She feels well today and has no abdominal pain. She is instructed to follow-up with her primary care physician Dr. Graciela Husbands in 2 weeks.  Consults: Internal medicine  Disposition: 01-Home or Self Care     Medication List    TAKE these medications   albuterol 108 (90 Base) MCG/ACT inhaler Commonly known as:  PROVENTIL HFA;VENTOLIN HFA Inhale 2 puffs into the lungs 2 (two) times daily.   amitriptyline 75 MG tablet Commonly known as:  ELAVIL Take 75 mg by mouth at bedtime.   aspirin 81 MG chewable tablet Chew 81 mg by mouth daily.   celecoxib 200 MG capsule Commonly known as:  CELEBREX Take 200 mg by mouth daily.   digoxin 0.125 MG tablet Commonly known as:  LANOXIN Take 0.125 mg by mouth daily.   Fluticasone-Salmeterol 100-50 MCG/DOSE Aepb Commonly known as:  ADVAIR Inhale 1 puff into the lungs 2 (two) times daily.   furosemide 20 MG tablet Commonly known as:  LASIX Take 40 mg by mouth daily.   losartan 25 MG tablet Commonly known as:  COZAAR Take 25 mg by mouth daily.   omeprazole 20 MG capsule Commonly known as:  PRILOSEC Take 20 mg by mouth daily.   pantoprazole 20 MG tablet Commonly known as:  PROTONIX Take 20 mg by mouth at bedtime.   polyethylene glycol packet Commonly known as:  MIRALAX / GLYCOLAX Take 17 g by mouth daily.   tiotropium 18 MCG inhalation capsule Commonly known as:  SPIRIVA Place 18 mcg into  inhaler and inhale daily.      Follow-up Information    BERT Maryjean Ka III, MD Follow up in 2 week(s).   Specialty:  Internal Medicine Contact information: [address] [facility] [place] [address] [phone]           Lattie Haw, MD, FACS

## 2016-01-15 NOTE — Discharge Instructions (Signed)
Resume all home medications Follow-up with Dr. Graciela Husbands in 2 weeks Resume normal activities. Advance soft to regular diet.    Small Bowel Obstruction A small bowel obstruction means that something is blocking the small bowel. The small bowel is also called the small intestine. It is the long tube that connects the stomach to the colon. An obstruction will stop food and fluids from passing through the small bowel. Treatment depends on what is causing the problem and how bad the problem is. Follow these instructions at home:  Get a lot of rest.  Follow your diet as told by your doctor. You may need to:  Only drink clear liquids until you start to get better.  Avoid solid foods as told by your doctor.  Take over-the-counter and prescription medicines only as told by your doctor.  Keep all follow-up visits as told by your doctor. This is important. Contact a doctor if:  You have a fever.  You have chills. Get help right away if:  You have pain or cramps that get worse.  You throw up (vomit) blood.  You have a feeling of being sick to your stomach (nausea) that does not go away.  You cannot stop throwing up.  You cannot drink fluids.  You feel confused.  You feel dry or thirsty (dehydrated).  Your belly gets more bloated.  You feel weak or you pass out (faint). This information is not intended to replace advice given to you by your health care provider. Make sure you discuss any questions you have with your health care provider. Document Released: 03/21/2004 Document Revised: 10/09/2015 Document Reviewed: 04/07/2014 Elsevier Interactive Patient Education  2017 [employer].

## 2016-01-15 NOTE — Care Management (Signed)
Home o2 order placed.  To be delivered to room prior to discharge.  RNCM signing off.

## 2016-01-15 NOTE — Care Management (Signed)
Orders placed for home health.  Awaiting orders for home O2

## 2016-01-15 NOTE — Care Management Important Message (Signed)
Important Message  Patient Details  Name: Maalle SAHARA SAYLER MRN: [id number] Date of Birth: 1927/10/02   Medicare Important Message Given:  Yes    Chapman Fitch, RN 01/15/2016, 10:19 AM

## 2016-01-15 NOTE — Care Management (Signed)
Patient admitted for SBO.  Plan for patient to discharge today.  Patient lives at home alone.  States that her son and daughter in law live "2 doors down".  Patient has a RW in the home for ambulation.  PCP Graciela Husbands.  Pharmacy [facility].  PT has recommend SNF.  Patient declines.  Patient agreeable to home health PT and RN.  Pt resting O2 saturations 85%.  Will need acute O2 at discharge. Patient states that she does not have home health agency preference.  Heads up referral made to [facility] with [facility].  Awaiting to discuss discharge disposition with Dr. Excell Seltzer.

## 2016-01-15 NOTE — Progress Notes (Signed)
SATURATION QUALIFICATIONS: (This note is used to comply with regulatory documentation for home oxygen)  Patient Saturations on Room Air at Rest = 85%  Patient Saturations on Room Air while Ambulating = n/a  Patient Saturations on Liters of oxygen while Ambulating = n/a  Please briefly explain why patient needs home oxygen: Patient oxygen saturation is less than 88% on room air Patient oxygen saturation is 100% on 2L

## 2016-01-16 NOTE — Progress Notes (Signed)
Patient discharged to home.  Concerns addressed. Oxygen provided. DNR form given back to patient. IV site removed.

## 2016-03-03 ENCOUNTER — Emergency Department: Payer: Medicare Other

## 2016-03-03 ENCOUNTER — Inpatient Hospital Stay
Admission: EM | Admit: 2016-03-03 | Discharge: 2016-03-06 | DRG: 544 | Disposition: A | Discharge status: Skilled Nursing Facility | Payer: Medicare Other | Attending: Internal Medicine | Admitting: Internal Medicine

## 2016-03-03 ENCOUNTER — Encounter: Payer: Self-pay | Admitting: Emergency Medicine

## 2016-03-03 DIAGNOSIS — I4891 Unspecified atrial fibrillation: Secondary | ICD-10-CM | POA: Diagnosis present

## 2016-03-03 DIAGNOSIS — Z933 Colostomy status: Secondary | ICD-10-CM

## 2016-03-03 DIAGNOSIS — M546 Pain in thoracic spine: Secondary | ICD-10-CM | POA: Diagnosis not present

## 2016-03-03 DIAGNOSIS — Z9049 Acquired absence of other specified parts of digestive tract: Secondary | ICD-10-CM

## 2016-03-03 DIAGNOSIS — I509 Heart failure, unspecified: Secondary | ICD-10-CM | POA: Diagnosis present

## 2016-03-03 DIAGNOSIS — J439 Emphysema, unspecified: Secondary | ICD-10-CM | POA: Diagnosis present

## 2016-03-03 DIAGNOSIS — Z66 Do not resuscitate: Secondary | ICD-10-CM | POA: Diagnosis present

## 2016-03-03 DIAGNOSIS — Z90710 Acquired absence of both cervix and uterus: Secondary | ICD-10-CM | POA: Diagnosis not present

## 2016-03-03 DIAGNOSIS — Z7982 Long term (current) use of aspirin: Secondary | ICD-10-CM

## 2016-03-03 DIAGNOSIS — Z888 Allergy status to other drugs, medicaments and biological substances status: Secondary | ICD-10-CM | POA: Diagnosis not present

## 2016-03-03 DIAGNOSIS — K5900 Constipation, unspecified: Secondary | ICD-10-CM | POA: Diagnosis present

## 2016-03-03 DIAGNOSIS — K219 Gastro-esophageal reflux disease without esophagitis: Secondary | ICD-10-CM | POA: Diagnosis present

## 2016-03-03 DIAGNOSIS — Z978 Presence of other specified devices: Secondary | ICD-10-CM

## 2016-03-03 DIAGNOSIS — Z95810 Presence of automatic (implantable) cardiac defibrillator: Secondary | ICD-10-CM | POA: Diagnosis not present

## 2016-03-03 DIAGNOSIS — Z87891 Personal history of nicotine dependence: Secondary | ICD-10-CM | POA: Diagnosis not present

## 2016-03-03 DIAGNOSIS — M542 Cervicalgia: Secondary | ICD-10-CM | POA: Diagnosis present

## 2016-03-03 DIAGNOSIS — W010XXA Fall on same level from slipping, tripping and stumbling without subsequent striking against object, initial encounter: Secondary | ICD-10-CM | POA: Diagnosis present

## 2016-03-03 DIAGNOSIS — M8088XA Other osteoporosis with current pathological fracture, vertebra(e), initial encounter for fracture: Secondary | ICD-10-CM | POA: Diagnosis not present

## 2016-03-03 DIAGNOSIS — Z9981 Dependence on supplemental oxygen: Secondary | ICD-10-CM | POA: Diagnosis not present

## 2016-03-03 DIAGNOSIS — S22000A Wedge compression fracture of unspecified thoracic vertebra, initial encounter for closed fracture: Secondary | ICD-10-CM | POA: Diagnosis present

## 2016-03-03 DIAGNOSIS — M1990 Unspecified osteoarthritis, unspecified site: Secondary | ICD-10-CM | POA: Diagnosis present

## 2016-03-03 DIAGNOSIS — I110 Hypertensive heart disease with heart failure: Secondary | ICD-10-CM | POA: Diagnosis present

## 2016-03-03 DIAGNOSIS — Z79899 Other long term (current) drug therapy: Secondary | ICD-10-CM | POA: Diagnosis not present

## 2016-03-03 DIAGNOSIS — Z8249 Family history of ischemic heart disease and other diseases of the circulatory system: Secondary | ICD-10-CM | POA: Diagnosis not present

## 2016-03-03 DIAGNOSIS — R2681 Unsteadiness on feet: Secondary | ICD-10-CM

## 2016-03-03 DIAGNOSIS — Y92009 Unspecified place in unspecified non-institutional (private) residence as the place of occurrence of the external cause: Secondary | ICD-10-CM

## 2016-03-03 DIAGNOSIS — G2581 Restless legs syndrome: Secondary | ICD-10-CM | POA: Diagnosis present

## 2016-03-03 DIAGNOSIS — D649 Anemia, unspecified: Secondary | ICD-10-CM | POA: Diagnosis present

## 2016-03-03 DIAGNOSIS — Z7951 Long term (current) use of inhaled steroids: Secondary | ICD-10-CM | POA: Diagnosis not present

## 2016-03-03 LAB — COMPREHENSIVE METABOLIC PANEL
ALBUMIN: 4.3 g/dL (ref 3.5–5.0)
ALT: 17 U/L (ref 14–54)
AST: 26 U/L (ref 15–41)
Alkaline Phosphatase: 65 U/L (ref 38–126)
Anion gap: 7 (ref 5–15)
BUN: 31 mg/dL — AB (ref 6–20)
CHLORIDE: 101 mmol/L (ref 101–111)
CO2: 29 mmol/L (ref 22–32)
CREATININE: 0.69 mg/dL (ref 0.44–1.00)
Calcium: 9.4 mg/dL (ref 8.9–10.3)
GFR calc Af Amer: >60 mL/min (ref >60)
GLUCOSE: 127 mg/dL — AB (ref 65–99)
Potassium: 4.3 mmol/L (ref 3.5–5.1)
Sodium: 137 mmol/L (ref 135–145)
Total Bilirubin: 0.4 mg/dL (ref 0.3–1.2)
Total Protein: 8.4 g/dL — ABNORMAL HIGH (ref 6.5–8.1)

## 2016-03-03 LAB — CBC WITH DIFFERENTIAL/PLATELET
BASOS ABS: 0.1 10*3/uL (ref 0–0.1)
Basophils Relative: 1 %
EOS PCT: 1 %
Eosinophils Absolute: 0.1 10*3/uL (ref 0–0.7)
HCT: 31.5 % — ABNORMAL LOW (ref 35.0–47.0)
Hemoglobin: 10.3 g/dL — ABNORMAL LOW (ref 12.0–16.0)
LYMPHS PCT: 10 %
Lymphs Abs: 1.2 10*3/uL (ref 1.0–3.6)
MCH: 30.9 pg (ref 26.0–34.0)
MCHC: 32.7 g/dL (ref 32.0–36.0)
MCV: 94.5 fL (ref 80.0–100.0)
Monocytes Absolute: 0.6 10*3/uL (ref 0.2–0.9)
Monocytes Relative: 5 %
Neutro Abs: 10.1 10*3/uL — ABNORMAL HIGH (ref 1.4–6.5)
Neutrophils Relative %: 83 %
PLATELETS: 254 10*3/uL (ref 150–440)
RBC: 3.33 MIL/uL — AB (ref 3.80–5.20)
RDW: 16.7 % — ABNORMAL HIGH (ref 11.5–14.5)
WBC: 12.1 10*3/uL — AB (ref 3.6–11.0)

## 2016-03-03 LAB — TYPE AND SCREEN
ABO/RH(D): O POS
Antibody Screen: NEGATIVE

## 2016-03-03 MED ORDER — LIDOCAINE 5 % EX PTCH
1.0000 | MEDICATED_PATCH | CUTANEOUS | Status: DC
  Administered 2016-03-04 – 2016-03-06 (×4): 1 via TRANSDERMAL
  Filled 2016-03-03 (×4): qty 1

## 2016-03-03 MED ORDER — ONDANSETRON HCL 4 MG/2ML IJ SOLN: 4.0000 mg | Freq: Four times a day (QID) | INTRAMUSCULAR | Status: DC | PRN

## 2016-03-03 MED ORDER — SENNOSIDES-DOCUSATE SODIUM 8.6-50 MG PO TABS
1.0000 | ORAL_TABLET | Freq: Every evening | ORAL | Status: DC | PRN
  Administered 2016-03-04: 1 via ORAL
  Filled 2016-03-03 (×2): qty 1

## 2016-03-03 MED ORDER — HYDROMORPHONE HCL 1 MG/ML IJ SOLN
0.5000 mg | Freq: Once | INTRAMUSCULAR | Status: AC
  Administered 2016-03-03: 0.5 mg via INTRAMUSCULAR
  Filled 2016-03-03: qty 1

## 2016-03-03 MED ORDER — OXYCODONE HCL 5 MG PO TABS
5.0000 mg | ORAL_TABLET | ORAL | Status: DC | PRN
  Administered 2016-03-04 – 2016-03-05 (×6): 5 mg via ORAL
  Filled 2016-03-03 (×6): qty 1

## 2016-03-03 MED ORDER — ACETAMINOPHEN 325 MG PO TABS
650.0000 mg | ORAL_TABLET | Freq: Four times a day (QID) | ORAL | Status: DC | PRN
  Administered 2016-03-04: 650 mg via ORAL
  Filled 2016-03-03 (×2): qty 2

## 2016-03-03 MED ORDER — FENTANYL CITRATE (PF) 100 MCG/2ML IJ SOLN
50.0000 ug | Freq: Once | INTRAMUSCULAR | Status: AC
  Administered 2016-03-03: 50 ug via INTRAVENOUS
  Filled 2016-03-03: qty 2

## 2016-03-03 MED ORDER — SODIUM CHLORIDE 0.9 % IV SOLN
INTRAVENOUS | Status: DC
  Administered 2016-03-04: via INTRAVENOUS

## 2016-03-03 MED ORDER — ONDANSETRON HCL 4 MG PO TABS: 4.0000 mg | ORAL_TABLET | Freq: Four times a day (QID) | ORAL | Status: DC | PRN

## 2016-03-03 MED ORDER — HEPARIN SODIUM (PORCINE) 5000 UNIT/ML IJ SOLN
5000.0000 [IU] | Freq: Three times a day (TID) | INTRAMUSCULAR | Status: DC
  Administered 2016-03-04 – 2016-03-05 (×5): 5000 [IU] via SUBCUTANEOUS
  Filled 2016-03-03 (×5): qty 1

## 2016-03-03 MED ORDER — BISACODYL 5 MG PO TBEC: 5.0000 mg | DELAYED_RELEASE_TABLET | Freq: Every day | ORAL | Status: DC | PRN

## 2016-03-03 MED ORDER — ACETAMINOPHEN 650 MG RE SUPP: 650.0000 mg | Freq: Four times a day (QID) | RECTAL | Status: DC | PRN

## 2016-03-03 MED ORDER — ALBUTEROL SULFATE (2.5 MG/3ML) 0.083% IN NEBU: 2.5000 mg | INHALATION_SOLUTION | Freq: Four times a day (QID) | RESPIRATORY_TRACT | Status: DC | PRN

## 2016-03-03 MED ORDER — SODIUM CHLORIDE 0.9 % IV SOLN
Freq: Once | INTRAVENOUS | Status: AC
  Administered 2016-03-03: 22:00:00 via INTRAVENOUS

## 2016-03-03 MED ORDER — MORPHINE SULFATE (PF) 2 MG/ML IV SOLN
1.0000 mg | INTRAVENOUS | Status: DC | PRN
  Administered 2016-03-04: 1 mg via INTRAVENOUS
  Filled 2016-03-03 (×2): qty 1

## 2016-03-03 MED ORDER — MAGNESIUM CITRATE PO SOLN
1.0000 | Freq: Once | ORAL | Status: AC | PRN
  Administered 2016-03-06: 1 via ORAL
  Filled 2016-03-03 (×2): qty 296

## 2016-03-03 NOTE — ED Notes (Signed)
RN made 2 unsuccessful IV attempts and asked Darl Pikes, RN who also attempted 2 unsuccessful attempts.

## 2016-03-03 NOTE — ED Notes (Signed)
Transporting to room 156-1A

## 2016-03-03 NOTE — ED Provider Notes (Signed)
[facility] Emergency Department Provider Note  Time seen: 5:44 PM  I have reviewed the triage vital signs and the nursing notes.   HISTORY  Chief Complaint Fall    HPI [name] is a 83 y.o. female presents to the emergency department after mechanical fall. According to the patient she was attempting to dress herself when she lost her balance and fell backwards landing on her bottom and back. Denies hitting her head. Denies loss of consciousness. Denies vomiting. Patient does take a baby/81 mg aspirin daily. States her only discomfort is in her mid back which is worse with movement or when she takes a deep breath. Denies any difficulty breathing. Patient is currently wearing 2 L of oxygen for comfort. States she is prescribed oxygen as needed at home. Moderate discomfort at this time especially with movement or palpation.  Past Medical History:  Diagnosis Date  . AICD (automatic cardioverter/defibrillator) present   . Anemia   . Arthritis   . Asthma   . Atrial fibrillation (HCC)    elected not to take coumadin  . Bacteremia 2007   asystole pacemaker failed to capture due to lead wire  . Bowel obstruction    multiple  . Cardiomyopathy (HCC) 1990  . CHF (congestive heart failure) (HCC)   . COPD (chronic obstructive pulmonary disease) (HCC)   . Diverticulosis   . Emphysema of lung (HCC)   . GERD (gastroesophageal reflux disease)   . History of IBS   . Multiple gastric ulcers    in recent years  . Presence of permanent cardiac pacemaker   . Restless leg syndrome   . Shortness of breath dyspnea    with exertion  . 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 Ulcerative colitis (HCC)    2007    Patient Active Problem List   Diagnosis Date Noted  . SBO (small bowel obstruction) 01/11/2016  . Small bowel obstruction 10/11/2014  . Intestinal obstruction due to adhesions 07/28/2014    Past Surgical History:  Procedure Laterality  Date  . ABDOMINAL HYSTERECTOMY  1990  . APPENDECTOMY  1990  . CARDIAC CATHETERIZATION    . CHOLECYSTECTOMY    . COLON SURGERY    . COLOSTOMY Left 1992  . COLOSTOMY TAKEDOWN  1993  . EYE SURGERY Bilateral    Cataract Extraction with IOL  . IMPLANTABLE CARDIOVERTER DEFIBRILLATOR (ICD) GENERATOR CHANGE N/A 05/26/2015   Procedure: ICD GENERATOR CHANGE;  Surgeon: Sharion Settler, MD;  Location: [facility] ORS;  Service: Cardiovascular;  Laterality: N/A;  . PACEMAKER INSERTION  2003  . VENTRICULAR CARDIAC PACEMAKER INSERTION  2007   biventricular pacer with defib implanted 2007 concerto [id number] serial [id number]    Prior to Admission medications   Medication Sig Start Date End Date Taking? Authorizing Provider  albuterol (PROVENTIL HFA;VENTOLIN HFA) 108 (90 BASE) MCG/ACT inhaler Inhale 2 puffs into the lungs 2 (two) times daily.    Historical Provider, MD  amitriptyline (ELAVIL) 75 MG tablet Take 75 mg by mouth at bedtime.    Historical Provider, MD  aspirin 81 MG chewable tablet Chew 81 mg by mouth daily.    Historical Provider, MD  celecoxib (CELEBREX) 200 MG capsule Take 200 mg by mouth daily.     Historical Provider, MD  digoxin (LANOXIN) 0.125 MG tablet Take 0.125 mg by mouth daily.    Historical Provider, MD  Fluticasone-Salmeterol (ADVAIR) 100-50 MCG/DOSE AEPB Inhale 1 puff into the lungs 2 (two) times  daily.    Historical Provider, MD  furosemide (LASIX) 20 MG tablet Take 40 mg by mouth daily.     Historical Provider, MD  losartan (COZAAR) 25 MG tablet Take 25 mg by mouth daily.    Historical Provider, MD  omeprazole (PRILOSEC) 20 MG capsule Take 20 mg by mouth daily.     Historical Provider, MD  pantoprazole (PROTONIX) 20 MG tablet Take 20 mg by mouth at bedtime.    Historical Provider, MD  polyethylene glycol (MIRALAX / GLYCOLAX) packet Take 17 g by mouth daily.    Historical Provider, MD  tiotropium (SPIRIVA) 18 MCG inhalation capsule Place 18 mcg into inhaler and inhale daily.     Historical Provider, MD    Allergies  Allergen Reactions  . Toprol Xl [Metoprolol] Other (See Comments)    Reaction: skin pulls away from the bottom of feet  . Ace Inhibitors Cough    Family History  Problem Relation Age of Onset  . Cancer Mother     Uterine  . Diabetes Father   . Heart disease Father     Social History Social History  Substance Use Topics  . Smoking status: Former Smoker    Packs/day: 1.00    Types: Cigarettes    Quit date: 03/28/2005  . Smokeless tobacco: Never Used  . Alcohol use Yes     Comment: occ    Review of Systems Constitutional: Negative for fever. Cardiovascular: Negative for chest pain. Respiratory: Negative for shortness of breath. Gastrointestinal: Negative for abdominal pain Musculoskeletal: Mid back pain. Skin: Negative for rash. Neurological: Negative for headaches, focal weakness or numbness. 10-point ROS otherwise negative.  ____________________________________________  Constitutional: Alert and oriented. Well appearing and in no distress. Eyes: Normal exam ENT   Head: Normocephalic and atraumatic   Mouth/Throat: Mucous membranes are moist. Cardiovascular: Normal rate, regular rhythm.  Respiratory: Normal respiratory effort without tachypnea nor retractions. Breath sounds are clear Gastrointestinal: Soft and nontender. No distention Musculoskeletal: Nontender with normal range of motion in all extremities. Stable pelvis. Moderate lower T-spine/upper L-spine tenderness. No cervical spine tenderness. Neurologic:  Normal speech and language. No gross focal neurologic deficits  Skin:  Skin is warm, dry and intact.  Psychiatric: Mood and affect are normal.   ____________________________________________     RADIOLOGY  T7 compression fracture with mild retropulsion.  ____________________________________________   INITIAL IMPRESSION / ASSESSMENT AND PLAN / ED COURSE  Pertinent labs & imaging results that were  available during my care of the patient were reviewed by me and considered in my medical decision making (see chart for details).  The patient presents to the emergency department after mechanical fall with mid back pain. Patient moves all extremities well without pain. Good range of motion bilateral lower extremities including hips without discomfort. We'll obtain imaging of the back and chest to further evaluate. Patient agreeable plan.  Discussed the compression fracture with Dr. Ardyth Harps spine/neurosurgery at [facility]. They cover [place] [facility] per Dr. Erma Heritage and would the covering physician see the patient tomorrow at [facility] if we can admit to the hospitalist. I discussed the patient with the hospitalist will be admitting for pain control. A Foley catheter has been placed. Patient has significant pain with any attempted movement. Remains neurologically intact in her lower extremities.  ____________________________________________   FINAL CLINICAL IMPRESSION(S) / ED DIAGNOSES  Fall Back pain T7 compression fracture   Minna Antis, MD 03/03/16 [id number]

## 2016-03-03 NOTE — ED Notes (Signed)
Patient transported to X-ray 

## 2016-03-03 NOTE — ED Triage Notes (Signed)
Pt arrived to ED by EMS after an unwitnessed fall. Pt pulled her life line and her sons responded and called EMS. Pt was found in a chest to knees position and had a difficult time straightening her back. Upon arrival Pt in upright position. Only c/o of pain is with palpation to her back midline.

## 2016-03-03 NOTE — H&P (Signed)
History and Physical   [facility] - [place] @ [facility] Admission History and Physical [employer], D.O.     Patient Name: [name] MR#: [id number] Date of Birth: [birth year] Date of Admission: 03/03/2016  Referring MD/NP/PA: Dr. Lenard Lance Primary Care Physician: Lynnea Ferrier, MD Outpatient Specialists: Dr. Lady Gary,   Patient coming from: Home, lives independently  Chief Complaint: Gillie Manners  HPI: [name] is a 83 y.o. female with a known history of afib, CHF, COPD (uses home O2) was in a usual state of health until this afternoon when she sustained a mechanical fall.  States she was putting on her underwear when she lost her footing and fell landing on her buttocks.  She used her LifeAlert button and her son arrived within 15 minutes.  She denies head trauma, loss consciousness, presyncope symptoms such as dizziness, lightheadedness, palpitations.   On questioning, she complained of mid back pain which is worse with inspiration.   Of note, patient was hospitalized on 01/11/2016 for a small bowel obstruction. Upon discharge the hospital. She was prescribed O2 for use at home. She currently uses it overnight and as needed during the day.   Otherwise there has been no change in status. Patient has been taking medication as prescribed and there has been no recent change in medication or diet.  No recent antibiotics.  There has been no recent illness, hospitalizations, travel or sick contacts.    Patient denies fevers/chills, weakness, dizziness, chest pain, shortness of breath, N/V/C/D, abdominal pain, dysuria/frequency, changes in mental status.   ED Course: In the emergency department she was found to have a moderate acute T7 compression fracture. She is unable to stand or ambulate secondary to intractable pain. [facility] was consulted and will be seen patient in the morning.  Review of Systems:  CONSTITUTIONAL: No fever/chills, fatigue, weakness, weight gain/loss,  headache. EYES: No blurry or double vision. ENT: No tinnitus, postnasal drip, redness or soreness of the oropharynx. RESPIRATORY: No cough, dyspnea, wheeze.  No hemoptysis.  CARDIOVASCULAR: No chest pain, palpitations, syncope, orthopnea. No lower extremity edema.  GASTROINTESTINAL: No nausea, vomiting, abdominal pain, diarrhea, constipation.  No hematemesis, melena or hematochezia. GENITOURINARY: No dysuria, frequency, hematuria. ENDOCRINE: No polyuria or nocturia. No heat or cold intolerance. HEMATOLOGY: No anemia, bruising, bleeding. INTEGUMENTARY: No rashes, ulcers, lesions. MUSCULOSKELETAL: No arthritis, gout, dyspnea. Positive mid back pain. NEUROLOGIC: No numbness, tingling, ataxia, seizure-type activity, weakness. PSYCHIATRIC: No anxiety, depression, insomnia.   Past Medical History:  Diagnosis Date  . AICD (automatic cardioverter/defibrillator) present   . Anemia   . Arthritis   . Asthma   . Atrial fibrillation (HCC)    elected not to take coumadin  . Bacteremia 2007   asystole pacemaker failed to capture due to lead wire  . Bowel obstruction    multiple  . Cardiomyopathy (HCC) 1990  . CHF (congestive heart failure) (HCC)   . COPD (chronic obstructive pulmonary disease) (HCC)   . Diverticulosis   . Emphysema of lung (HCC)   . GERD (gastroesophageal reflux disease)   . History of IBS   . Multiple gastric ulcers    in recent years  . Presence of permanent cardiac pacemaker   . Restless leg syndrome   . Shortness of breath dyspnea    with exertion  . 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 Ulcerative colitis (HCC)    2007    Past Surgical History:  Procedure Laterality Date  .  ABDOMINAL HYSTERECTOMY  1990  . APPENDECTOMY  1990  . CARDIAC CATHETERIZATION    . CHOLECYSTECTOMY    . COLON SURGERY    . COLOSTOMY Left 1992  . COLOSTOMY TAKEDOWN  1993  . EYE SURGERY Bilateral    Cataract Extraction with IOL  . IMPLANTABLE CARDIOVERTER  DEFIBRILLATOR (ICD) GENERATOR CHANGE N/A 05/26/2015   Procedure: ICD GENERATOR CHANGE;  Surgeon: Sharion Settler, MD;  Location: [facility] ORS;  Service: Cardiovascular;  Laterality: N/A;  . PACEMAKER INSERTION  2003  . VENTRICULAR CARDIAC PACEMAKER INSERTION  2007   biventricular pacer with defib implanted 2007 concerto [id number] serial [id number]     reports that she quit smoking about 10 years ago. Her smoking use included Cigarettes. She smoked 1.00 pack per day. She has never used smokeless tobacco. She reports that she drinks alcohol. She reports that she does not use drugs.  Allergies  Allergen Reactions  . Toprol Xl [Metoprolol] Other (See Comments)    Reaction: skin pulls away from the bottom of feet  . Ace Inhibitors Cough    Family History  Problem Relation Age of Onset  . Cancer Mother     Uterine  . Diabetes Father   . Heart disease Father    Family history has been reviewed and confirmed with patient.   Prior to Admission medications   Medication Sig Start Date End Date Taking? Authorizing Provider  albuterol (PROVENTIL HFA;VENTOLIN HFA) 108 (90 BASE) MCG/ACT inhaler Inhale 2 puffs into the lungs 2 (two) times daily.    Historical Provider, MD  amitriptyline (ELAVIL) 75 MG tablet Take 75 mg by mouth at bedtime.    Historical Provider, MD  aspirin 81 MG chewable tablet Chew 81 mg by mouth daily.    Historical Provider, MD  celecoxib (CELEBREX) 200 MG capsule Take 200 mg by mouth daily.     Historical Provider, MD  digoxin (LANOXIN) 0.125 MG tablet Take 0.125 mg by mouth daily.    Historical Provider, MD  Fluticasone-Salmeterol (ADVAIR) 100-50 MCG/DOSE AEPB Inhale 1 puff into the lungs 2 (two) times daily.    Historical Provider, MD  furosemide (LASIX) 20 MG tablet Take 40 mg by mouth daily.     Historical Provider, MD  losartan (COZAAR) 25 MG tablet Take 25 mg by mouth daily.    Historical Provider, MD  omeprazole (PRILOSEC) 20 MG capsule Take 20 mg by mouth daily.      Historical Provider, MD  pantoprazole (PROTONIX) 20 MG tablet Take 20 mg by mouth at bedtime.    Historical Provider, MD  polyethylene glycol (MIRALAX / GLYCOLAX) packet Take 17 g by mouth daily.    Historical Provider, MD  tiotropium (SPIRIVA) 18 MCG inhalation capsule Place 18 mcg into inhaler and inhale daily.    Historical Provider, MD    Physical Exam: Vitals:   03/03/16 1743 03/03/16 1748 03/03/16 2001  BP:  (!) 164/81 (!) 161/68  Pulse:  63 82  Resp:  (!) 27 20  Temp:  98.2 F (36.8 C)   SpO2: (!) 88% 91% 96%  Weight:  76.2 kg (168 lb)   Height:  5\' 3"  (1.6 m)     GENERAL: 83 y.o.-year-old White female patient, well-developed, well-nourished lying in the bed in no acute distress.  Pleasant and cooperative.   HEENT: Head atraumatic, normocephalic. Pupils equal, round, reactive to light and accommodation. No scleral icterus. Extraocular muscles intact. Nares are patent. Oropharynx is clear. Mucus membranes moist. NECK: Supple, full range of motion. No  JVD, no bruit heard. No thyroid enlargement, no tenderness, no cervical lymphadenopathy. CHEST: Normal breath sounds bilaterally. No wheezing, rales, rhonchi or crackles. No use of accessory muscles of respiration.  No reproducible chest wall tenderness.  CARDIOVASCULAR: S1, S2 normal. No murmurs, rubs, or gallops. Cap refill <2 seconds. Pulses intact distally.  ABDOMEN: Soft, nondistended, nontender. No rebound, guarding, rigidity. Normoactive bowel sounds present in all four quadrants. No organomegaly or mass. EXTREMITIES: No pedal edema, cyanosis, or clubbing. No calf tenderness or Homan's sign.  NEUROLOGIC: The patient is alert and oriented x 3. Cranial nerves II through XII are grossly intact with no focal sensorimotor deficit. Muscle strength 5/5 in all extremities. Sensation intact. Gait not checked. PSYCHIATRIC:  Normal affect, mood, thought content. SKIN: Warm, dry, and intact without obvious rash, lesion, or ulcer. SPINE:  Moderate tenderness to palpation over the lower thoracic spinal column. Paravertebral muscle spasm.    Labs on Admission:  CBC:  Recent Labs Lab 03/03/16 1958  WBC 12.1*  NEUTROABS 10.1*  HGB 10.3*  HCT 31.5*  MCV 94.5  PLT 254   Basic Metabolic Panel:  Recent Labs Lab 03/03/16 1958  NA 137  K 4.3  CL 101  CO2 29  GLUCOSE 127*  BUN 31*  CREATININE 0.69  CALCIUM 9.4   GFR: Estimated Creatinine Clearance: 49.3 mL/min (by C-G formula based on SCr of 0.69 mg/dL). Liver Function Tests:  Recent Labs Lab 03/03/16 1958  AST 26  ALT 17  ALKPHOS 65  BILITOT 0.4  PROT 8.4*  ALBUMIN 4.3   No results for input(s): LIPASE, AMYLASE in the last 168 hours. No results for input(s): AMMONIA in the last 168 hours. Coagulation Profile: No results for input(s): INR, PROTIME in the last 168 hours. Cardiac Enzymes: No results for input(s): CKTOTAL, CKMB, CKMBINDEX, TROPONINI in the last 168 hours. BNP (last 3 results) No results for input(s): PROBNP in the last 8760 hours. HbA1C: No results for input(s): HGBA1C in the last 72 hours. CBG: No results for input(s): GLUCAP in the last 168 hours. Lipid Profile: No results for input(s): CHOL, HDL, LDLCALC, TRIG, CHOLHDL, LDLDIRECT in the last 72 hours. Thyroid Function Tests: No results for input(s): TSH, T4TOTAL, FREET4, T3FREE, THYROIDAB in the last 72 hours. Anemia Panel: No results for input(s): VITAMINB12, FOLATE, FERRITIN, TIBC, IRON, RETICCTPCT in the last 72 hours. Urine analysis:    Component Value Date/Time   COLORURINE YELLOW (A) 01/12/2016 0217   APPEARANCEUR CLEAR (A) 01/12/2016 0217   APPEARANCEUR Cloudy 11/17/2011 0304   LABSPEC 1.038 (H) 01/12/2016 0217   LABSPEC 1.017 11/17/2011 0304   PHURINE 6.0 01/12/2016 0217   GLUCOSEU NEGATIVE 01/12/2016 0217   GLUCOSEU Negative 11/17/2011 0304   HGBUR NEGATIVE 01/12/2016 0217   BILIRUBINUR NEGATIVE 01/12/2016 0217   BILIRUBINUR Negative 11/17/2011 0304    KETONESUR NEGATIVE 01/12/2016 0217   PROTEINUR NEGATIVE 01/12/2016 0217   NITRITE NEGATIVE 01/12/2016 0217   LEUKOCYTESUR NEGATIVE 01/12/2016 0217   LEUKOCYTESUR Negative 11/17/2011 0304   Sepsis Labs: @LABRCNTIP (procalcitonin:4,lacticidven:4) )No results found for this or any previous visit (from the past 240 hour(s)).   Radiological Exams on Admission: Dg Chest 2 View  Result Date: 03/03/2016 CLINICAL DATA:  Pain after fall EXAM: CHEST  2 VIEW COMPARISON:  October 11, 2014 FINDINGS: Stable AICD device. No other acute abnormalities or changes in the soft tissues of the chest. Anterior wedging of T7 was described on the thoracic spine study. Stable scarring in the left apex. IMPRESSION: Compression fracture T7 described on  the thoracic spine study. No other acute abnormalities. Electronically Signed   By: Gerome Sam III M.D   On: 03/03/2016 18:35   Dg Thoracic Spine 2 View  Result Date: 03/03/2016 CLINICAL DATA:  Pain after fall. EXAM: THORACIC SPINE 2 VIEWS COMPARISON:  Multiple chest x-rays since July 2013. The most recent comparison is from January 11, 2016 FINDINGS: Scoliotic curvature of the thoracic spine is identified. No inter pedicular widening is identified. Anterior wedging of a mid thoracic vertebral body, favored to be T7, is identified. Based on the AP view, this was not visualized on the November 2017 study and may be acute. There is at least 50% loss of height. No other acute abnormalities. IMPRESSION: Compression fracture with at least 50% loss of height of a mid thoracic vertebral body, favored to be T7. This is apparently new when compared to November 2017. Electronically Signed   By: Gerome Sam III M.D   On: 03/03/2016 18:30   Dg Lumbar Spine Complete  Result Date: 03/03/2016 CLINICAL DATA:  Pain after fall EXAM: LUMBAR SPINE - COMPLETE 4+ VIEW COMPARISON:  January 14, 2016 FINDINGS: Scoliotic curvature of the lumbar spine is identified. There is scalloping of the  superior endplates of L2 and L3. The findings are age indeterminate but may be acute, particularly at L2. No other acute abnormalities. Degenerative changes noted. IMPRESSION: Scalloping of the superior endplates of L2 and L3 is age indeterminate but may be acute. Recommend cross-sectional imaging and clinical correlation for better evaluation. Electronically Signed   By: Gerome Sam III M.D   On: 03/03/2016 18:34   Ct Thoracic Spine Wo Contrast  Result Date: 03/03/2016 CLINICAL DATA:  Fall.  Back pain.  Fracture thoracic spine EXAM: CT THORACIC SPINE WITHOUT CONTRAST TECHNIQUE: Multidetector CT images of the thoracic were obtained using the standard protocol without intravenous contrast. COMPARISON:  Thoracic radiographs 03/03/2016 FINDINGS: Alignment: Normal alignment.  Thoracic scoliosis. Vertebrae: Moderately severe fracture T7 which is probably acute. There is paraspinous soft tissue swelling compatible with hemorrhage from recent fracture. Mild irregularity of the endplates. Mild retropulsion of bone into the canal without significant spinal stenosis. No other fracture Paraspinal and other soft tissues: Mild paraspinous soft tissue thickening around the T7 fracture compatible with edema and hemorrhage from recent fracture. Disc levels: Mild thoracic disc degeneration and facet degeneration. No significant spinal stenosis. Apical emphysema and scarring in the lungs. No pleural effusion. Atherosclerotic aorta. Pacemaker leads. IMPRESSION: Moderate compression fracture T7 appears acute. Mild retropulsion of bone into the canal without significant spinal stenosis. No other fracture. Thoracic scoliosis Electronically Signed   By: Marlan Palau M.D.   On: 03/03/2016 20:29   Ct Lumbar Spine Wo Contrast  Result Date: 03/03/2016 CLINICAL DATA:  Fall.  Back pain.  Compression fracture EXAM: CT LUMBAR SPINE WITHOUT CONTRAST TECHNIQUE: Multidetector CT imaging of the lumbar spine was performed without  intravenous contrast administration. Multiplanar CT image reconstructions were also generated. COMPARISON:  Lumbar radiographs 03/03/2016. CT abdomen pelvis 01/11/2016 FINDINGS: Segmentation: Lowest disc space L5-S1. Alignment: Moderate dextroscoliosis at L4-5. Mild anterior slip L2-3, L3-4, L4-5, L5-S1 Vertebrae: Mild compression fractures L2 and L3. These appear chronic and unchanged from the CT abdomen pelvis 01/11/2016. No acute fracture. Paraspinal and other soft tissues: Atherosclerotic aorta without aneurysm. No retroperitoneal mass or adenopathy. Disc levels: L1-2: Advanced facet degeneration. Mild spinal stenosis. Disc bulging L2-3: Diffuse disc bulging. Advanced facet hypertrophy with moderate spinal stenosis L3-4: Diffuse disc bulging. Severe facet arthropathy and severe spinal stenosis.  L4-5: Calcified disc. Mild facet degeneration without significant spinal stenosis L5-S1: Calcified disc. No significant stenosis. Mild facet degeneration IMPRESSION: Mild compression fractures L2 and a third L3 appear chronic and unchanged from prior studies. No acute fracture Mild spinal stenosis L1-2 Moderate spinal stenosis L2-3 Severe spinal stenosis L4-5 Electronically Signed   By: Marlan Palau M.D.   On: 03/03/2016 20:33   Assessment/Plan Active Problems:   Thoracic compression fracture, closed, initial encounter [facility])    This is a 83 y.o. female with a history of afib, CHF, COPD (uses home O2)  now being admitted with:  1. Intractable back pain secondary to acute T7 compression fracture with mild retropulsion -Admit to inpatient for pain control. Patient is noted to have multiple small bowel objections in the past. Therefore, we will utilize sliding scale pain coverage to minimize narcotic use.  - Spine surgery consultation in a.m. - Nothing by mouth for consideration of surgery or interventional procedure. -We'll order TLSO brace -Foley catheter placed by emergency department -PT consult when  cleared by surgery -Incentive spirometry for pneumonia prophylaxis  2. Anemia, chronic and stable as check CBC in a.m. 3. History of COPD-continue Spiriva, Advair, albuterol. 4. History of osteoarthritis-continue Celebrex. 5. History of atrial fibrillation-continue digoxin. 6. History of hypertension-continue Cozaar  7. History of GERD-continue Prilosec and Protonix 8. History of CHF-continue Lasix  Admission status: Inpatient IV Fluids: IV normal saline Diet/Nutrition: Nothing by mouth Consults called: [facility]  DVT Px: A SCDs and early ambulation. Code Status: DNR Disposition Plan: To SNF in 2-3 days   All the records are reviewed and case discussed with ED provider. Management plans discussed with the patient and/or family who express understanding and agree with plan of care.  Macklin Jacquin D.O. on 03/03/2016 at 9:41 PM Between 7am to 6pm - Pager - [phone] After 6pm go to [email or web address] - Social research officer, government [facility] [place] [facility] Office [phone] CC: Primary care physician; Lynnea Ferrier, MD   03/03/2016, 9:41 PM

## 2016-03-04 ENCOUNTER — Inpatient Hospital Stay: Payer: Medicare Other

## 2016-03-04 LAB — CBC
HEMATOCRIT: 27.5 % — AB (ref 35.0–47.0)
Hemoglobin: 9.1 g/dL — ABNORMAL LOW (ref 12.0–16.0)
MCH: 31.4 pg (ref 26.0–34.0)
MCHC: 33.2 g/dL (ref 32.0–36.0)
MCV: 94.6 fL (ref 80.0–100.0)
PLATELETS: 228 10*3/uL (ref 150–440)
RBC: 2.9 MIL/uL — ABNORMAL LOW (ref 3.80–5.20)
RDW: 16.4 % — ABNORMAL HIGH (ref 11.5–14.5)
WBC: 8.4 10*3/uL (ref 3.6–11.0)

## 2016-03-04 LAB — BASIC METABOLIC PANEL
ANION GAP: 5 (ref 5–15)
BUN: 23 mg/dL — ABNORMAL HIGH (ref 6–20)
CHLORIDE: 103 mmol/L (ref 101–111)
CO2: 28 mmol/L (ref 22–32)
CREATININE: 0.57 mg/dL (ref 0.44–1.00)
Calcium: 8.6 mg/dL — ABNORMAL LOW (ref 8.9–10.3)
GFR calc non Af Amer: >60 mL/min (ref >60)
Glucose, Bld: 132 mg/dL — ABNORMAL HIGH (ref 65–99)
POTASSIUM: 4.1 mmol/L (ref 3.5–5.1)
SODIUM: 136 mmol/L (ref 135–145)

## 2016-03-04 MED ORDER — ALBUTEROL SULFATE (2.5 MG/3ML) 0.083% IN NEBU
2.5000 mg | INHALATION_SOLUTION | Freq: Two times a day (BID) | RESPIRATORY_TRACT | Status: DC
  Administered 2016-03-04 – 2016-03-05 (×2): 2.5 mg via RESPIRATORY_TRACT
  Filled 2016-03-04 (×3): qty 3

## 2016-03-04 MED ORDER — FUROSEMIDE 40 MG PO TABS
40.0000 mg | ORAL_TABLET | Freq: Every day | ORAL | Status: DC
  Administered 2016-03-04 – 2016-03-06 (×3): 40 mg via ORAL
  Filled 2016-03-04 (×3): qty 1

## 2016-03-04 MED ORDER — TIOTROPIUM BROMIDE MONOHYDRATE 18 MCG IN CAPS
18.0000 ug | ORAL_CAPSULE | Freq: Every day | RESPIRATORY_TRACT | Status: DC
  Administered 2016-03-04 – 2016-03-06 (×2): 18 ug via RESPIRATORY_TRACT
  Filled 2016-03-04: qty 5

## 2016-03-04 MED ORDER — CELECOXIB 200 MG PO CAPS
200.0000 mg | ORAL_CAPSULE | Freq: Every day | ORAL | Status: DC
  Administered 2016-03-04 – 2016-03-06 (×3): 200 mg via ORAL
  Filled 2016-03-04 (×3): qty 1

## 2016-03-04 MED ORDER — PANTOPRAZOLE SODIUM 40 MG PO TBEC
40.0000 mg | DELAYED_RELEASE_TABLET | Freq: Every day | ORAL | Status: DC
  Administered 2016-03-04 – 2016-03-06 (×3): 40 mg via ORAL
  Filled 2016-03-04 (×3): qty 1

## 2016-03-04 MED ORDER — AMITRIPTYLINE HCL 25 MG PO TABS
75.0000 mg | ORAL_TABLET | Freq: Every day | ORAL | Status: DC
  Administered 2016-03-04 – 2016-03-05 (×2): 75 mg via ORAL
  Filled 2016-03-04 (×2): qty 1

## 2016-03-04 MED ORDER — ASPIRIN 81 MG PO CHEW
81.0000 mg | CHEWABLE_TABLET | Freq: Every day | ORAL | Status: DC
  Administered 2016-03-04 – 2016-03-06 (×3): 81 mg via ORAL
  Filled 2016-03-04 (×3): qty 1

## 2016-03-04 MED ORDER — MOMETASONE FURO-FORMOTEROL FUM 100-5 MCG/ACT IN AERO
2.0000 | INHALATION_SPRAY | Freq: Two times a day (BID) | RESPIRATORY_TRACT | Status: DC
  Administered 2016-03-04 – 2016-03-06 (×4): 2 via RESPIRATORY_TRACT
  Filled 2016-03-04: qty 8.8

## 2016-03-04 MED ORDER — LOSARTAN POTASSIUM 25 MG PO TABS
25.0000 mg | ORAL_TABLET | Freq: Every day | ORAL | Status: DC
  Administered 2016-03-04 – 2016-03-06 (×3): 25 mg via ORAL
  Filled 2016-03-04 (×3): qty 1

## 2016-03-04 MED ORDER — DIGOXIN 125 MCG PO TABS
0.1250 mg | ORAL_TABLET | Freq: Every day | ORAL | Status: DC
  Administered 2016-03-04 – 2016-03-06 (×3): 0.125 mg via ORAL
  Filled 2016-03-04 (×3): qty 1

## 2016-03-04 NOTE — Progress Notes (Signed)
PT Cancellation Note  Patient Details Name: [name] MRN: [id number] DOB: [birth year]   Cancelled Treatment:    Reason Eval/Treat Not Completed: Patient not medically ready.  Per Internal Medicine note, hold PT until pt seen by spinal surgical team.  Will check back again later today, schedule permitting.  Encarnacion Chu PT, DPT 03/04/2016, 8:24 AM

## 2016-03-04 NOTE — Progress Notes (Signed)
Patient stated has Hx of SBO

## 2016-03-04 NOTE — Care Management Important Message (Signed)
Important Message  Patient Details  Name: Cecillia NIKIA WEINGART MRN: [id number] Date of Birth: 19-May-1927   Medicare Important Message Given:  Yes    Marily Memos, RN 03/04/2016, 10:51 AM

## 2016-03-04 NOTE — Progress Notes (Signed)
[employer] - [place] at [facility]   PATIENT NAME: [name]    MR#:  [id number]  DATE OF BIRTH:  04-22-1927  SUBJECTIVE:  CHIEF COMPLAINT:   Chief Complaint  Patient presents with  . Fall  In pain, family at bedside. Pt working with PT REVIEW OF SYSTEMS:  Review of Systems  Constitutional: Positive for malaise/fatigue. Negative for chills, fever and weight loss.  HENT: Negative for nosebleeds and sore throat.   Eyes: Negative for blurred vision.  Respiratory: Negative for cough, shortness of breath and wheezing.   Cardiovascular: Negative for chest pain, orthopnea, leg swelling and PND.  Gastrointestinal: Negative for abdominal pain, constipation, diarrhea, heartburn, nausea and vomiting.  Genitourinary: Negative for dysuria and urgency.  Musculoskeletal: Positive for back pain and falls.  Skin: Negative for rash.  Neurological: Positive for weakness. Negative for dizziness, speech change, focal weakness and headaches.  Endo/Heme/Allergies: Does not bruise/bleed easily.  Psychiatric/Behavioral: Negative for depression.    DRUG ALLERGIES:   Allergies  Allergen Reactions  . Toprol Xl [Metoprolol] Other (See Comments)    Reaction: skin pulls away from the bottom of feet  . Ace Inhibitors Cough  . Diltiazem Hcl Other (See Comments)   VITALS:  Blood pressure (!) 156/59, pulse (!) 59, temperature 98.1 F (36.7 C), temperature source Oral, resp. rate 18, height 5\' 3"  (1.6 m), weight 75.6 kg (166 lb 9.6 oz), SpO2 98 %. PHYSICAL EXAMINATION:  Physical Exam  Constitutional: She is oriented to person, place, and time and well-developed, well-nourished, and in no distress.  HENT:  Head: Normocephalic and atraumatic.  Eyes: Conjunctivae and EOM are normal. Pupils are equal, round, and reactive to light.  Neck: Normal range of motion. Neck supple. No tracheal deviation present. No thyromegaly present.  Cardiovascular: Normal rate, regular rhythm and normal  heart sounds.   Pulmonary/Chest: Effort normal and breath sounds normal. No respiratory distress. She has no wheezes. She exhibits no tenderness.  Abdominal: Soft. Bowel sounds are normal. She exhibits no distension. There is no tenderness.  Musculoskeletal:       Lumbar back: She exhibits decreased range of motion, tenderness and bony tenderness.  TLSO brace in place  Neurological: She is alert and oriented to person, place, and time. No cranial nerve deficit.  Skin: Skin is warm and dry. No rash noted.  Psychiatric: Mood and affect normal.   LABORATORY PANEL:   CBC  Recent Labs Lab 03/04/16 0412  WBC 8.4  HGB 9.1*  HCT 27.5*  PLT 228   ------------------------------------------------------------------------------------------------------------------ Chemistries   Recent Labs Lab 03/03/16 1958 03/04/16 0412  NA 137 136  K 4.3 4.1  CL 101 103  CO2 29 28  GLUCOSE 127* 132*  BUN 31* 23*  CREATININE 0.69 0.57  CALCIUM 9.4 8.6*  AST 26  --   ALT 17  --   ALKPHOS 65  --   BILITOT 0.4  --    RADIOLOGY:  Dg Thoracic Spine 2 View  Result Date: 03/04/2016 CLINICAL DATA:  Fall. EXAM: THORACIC SPINE 2 VIEWS COMPARISON:  CT 03/03/2016 . FINDINGS: AICD noted. With prominent mid thoracic spine compression fracture again noted. Associated kyphoscoliosis noted. No change from prior CT of 03/03/2016. IMPRESSION: Diffuse osteopenia degenerative change with prominent mid thoracic vertebral body compression fracture. Associated kyphoscoliosis noted . Exam unchanged from prior CT of 03/03/2016 . Electronically Signed   By: Maisie Fus  Register   On: 03/04/2016 13:47   Ct Thoracic Spine Wo Contrast  Result Date: 03/03/2016  CLINICAL DATA:  Fall.  Back pain.  Fracture thoracic spine EXAM: CT THORACIC SPINE WITHOUT CONTRAST TECHNIQUE: Multidetector CT images of the thoracic were obtained using the standard protocol without intravenous contrast. COMPARISON:  Thoracic radiographs 03/03/2016 FINDINGS:  Alignment: Normal alignment.  Thoracic scoliosis. Vertebrae: Moderately severe fracture T7 which is probably acute. There is paraspinous soft tissue swelling compatible with hemorrhage from recent fracture. Mild irregularity of the endplates. Mild retropulsion of bone into the canal without significant spinal stenosis. No other fracture Paraspinal and other soft tissues: Mild paraspinous soft tissue thickening around the T7 fracture compatible with edema and hemorrhage from recent fracture. Disc levels: Mild thoracic disc degeneration and facet degeneration. No significant spinal stenosis. Apical emphysema and scarring in the lungs. No pleural effusion. Atherosclerotic aorta. Pacemaker leads. IMPRESSION: Moderate compression fracture T7 appears acute. Mild retropulsion of bone into the canal without significant spinal stenosis. No other fracture. Thoracic scoliosis Electronically Signed   By: Marlan Palau M.D.   On: 03/03/2016 20:29   Ct Lumbar Spine Wo Contrast  Result Date: 03/03/2016 CLINICAL DATA:  Fall.  Back pain.  Compression fracture EXAM: CT LUMBAR SPINE WITHOUT CONTRAST TECHNIQUE: Multidetector CT imaging of the lumbar spine was performed without intravenous contrast administration. Multiplanar CT image reconstructions were also generated. COMPARISON:  Lumbar radiographs 03/03/2016. CT abdomen pelvis 01/11/2016 FINDINGS: Segmentation: Lowest disc space L5-S1. Alignment: Moderate dextroscoliosis at L4-5. Mild anterior slip L2-3, L3-4, L4-5, L5-S1 Vertebrae: Mild compression fractures L2 and L3. These appear chronic and unchanged from the CT abdomen pelvis 01/11/2016. No acute fracture. Paraspinal and other soft tissues: Atherosclerotic aorta without aneurysm. No retroperitoneal mass or adenopathy. Disc levels: L1-2: Advanced facet degeneration. Mild spinal stenosis. Disc bulging L2-3: Diffuse disc bulging. Advanced facet hypertrophy with moderate spinal stenosis L3-4: Diffuse disc bulging. Severe facet  arthropathy and severe spinal stenosis. L4-5: Calcified disc. Mild facet degeneration without significant spinal stenosis L5-S1: Calcified disc. No significant stenosis. Mild facet degeneration IMPRESSION: Mild compression fractures L2 and a third L3 appear chronic and unchanged from prior studies. No acute fracture Mild spinal stenosis L1-2 Moderate spinal stenosis L2-3 Severe spinal stenosis L4-5 Electronically Signed   By: Marlan Palau M.D.   On: 03/03/2016 20:33   ASSESSMENT AND PLAN:  This is a 83 y.o. female with a history of afib, CHF, COPD (uses home O2)  now being admitted with:  1. Intractable back pain secondary to acute T7 compression fracture with mild retropulsion - continue pain meds for pain control.  - Appreciate Neurosurgery input. nonsurgical and outpt f/up in 2 weeks -  TLSO brace -Foley catheter removal when able -PT consult when cleared by surgery -Incentive spirometry for pneumonia prophylaxis  2. Anemia, chronic and stable  3. History of COPD-continue Spiriva, Advair, albuterol. 4. History of osteoarthritis-continue Celebrex. 5. History of atrial fibrillation-continue digoxin. 6. History of hypertension-continue Cozaar  7. History of GERD-continue Prilosec and Protonix 8. History of CHF-continue Lasix     All the records are reviewed and case discussed with Care Management/Social Worker. Management plans discussed with the patient, family and they are in agreement.  CODE STATUS: DNR  TOTAL TIME TAKING CARE OF THIS PATIENT: 35 minutes.   More than 50% of the time was spent in counseling/coordination of care: YES  POSSIBLE D/C IN 1-2 DAYS, DEPENDING ON CLINICAL CONDITION.   Delfino Lovett M.D on 03/04/2016 at 7:39 PM  Between 7am to 6pm - Pager - [phone]  After 6pm go to [email or web address] - password [email or web address]  [employer] [facility]  Office  [phone]  CC: Primary care physician; Lynnea Ferrier, MD  Note: This dictation was  prepared with Dragon dictation along with smaller phrase technology. Any transcriptional errors that result from this process are unintentional.

## 2016-03-04 NOTE — Consult Note (Signed)
Neurosurgery-New Consultation Evaluation 03/04/2016 [name] [id number]  Identifying Statement: [name] is a 83 y.o. female from [place] [address] with thoracic fracture  Physician Requesting Consultation: Hospitalist, Dr. Jon Gills Hugelmeyer  History of Present Illness: [name] is here after a fall at home from standing height. She states she landed backwards and hit her back. She has been having significant pain in the mid thoracic area as well as radiating to the left ribs. She is also having increased neck pain since the fall. She denies loss of consciousness. She states she does not have any new weakness or numbness in her legs but has been unable to get up to walk due to pain. Given her bedrest, a foley catheter was placed.   CT of the spine showed a thoracic compression fracture and she was admitted to the hospital for pain control.   Her history is significant for atrial fibrillation, AICD, COPD, and CHF  Past Medical History:  Past Medical History:  Diagnosis Date  . AICD (automatic cardioverter/defibrillator) present   . Anemia   . Arthritis   . Asthma   . Atrial fibrillation (HCC)    elected not to take coumadin  . Bacteremia 2007   asystole pacemaker failed to capture due to lead wire  . Bowel obstruction    multiple  . Cardiomyopathy (HCC) 1990  . CHF (congestive heart failure) (HCC)   . COPD (chronic obstructive pulmonary disease) (HCC)   . Diverticulosis   . Emphysema of lung (HCC)   . GERD (gastroesophageal reflux disease)   . History of IBS   . Multiple gastric ulcers    in recent years  . Presence of permanent cardiac pacemaker   . Restless leg syndrome   . Shortness of breath dyspnea    with exertion  . 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 Ulcerative colitis (HCC)    2007    Social History: Social History   Social History  . Marital status: Widowed    Spouse name: N/A  . Number of children: N/A  . Years of education:  N/A   Occupational History  . Not on file.   Social History Main Topics  . Smoking status: Former Smoker    Packs/day: 1.00    Types: Cigarettes    Quit date: 03/28/2005  . Smokeless tobacco: Never Used  . Alcohol use Yes     Comment: occ  . Drug use: No  . Sexual activity: No   Other Topics Concern  . Not on file   Social History Narrative  . No narrative on file    Family History: Family History  Problem Relation Age of Onset  . Cancer Mother     Uterine  . Diabetes Father   . Heart disease Father     Review of Systems:  Review of Systems - General ROS: Negative Psychological ROS: Negative Ophthalmic ROS: Negative ENT ROS: Negative Hematological and Lymphatic ROS: Negative  Endocrine ROS: Negative Respiratory ROS: Negative Cardiovascular ROS: Negative Gastrointestinal ROS: Negative Genito-Urinary ROS: Negative Musculoskeletal ROS: Positive for back pain Neurological ROS: Negative for weakness or numbness Dermatological ROS: Negative  Physical Exam: BP (!) 130/53   Pulse 76   Temp 97.8 F (36.6 C) (Oral)   Resp 18   Ht 5\' 3"  (1.6 m)   Wt 75.6 kg (166 lb 9.6 oz)   SpO2 99%   BMI 29.51 kg/m  Body mass index is 29.51 kg/m. Body  surface area is 1.83 meters squared. General appearance: Alert, cooperative, in no acute distress, lying flat in bed Head: Normocephalic Back: tenderness to palpation in midine of mid thoracic area, no step-off  Neurologic exam:  Mental status: alertness: alert,  affect: normal Speech: fluent and clear Motor: 5/5 strength in lower extremities Sensory: intact to light touch in lower extremities Gait: Not tested due to no brace  Laboratory: Results for orders placed or performed during the hospital encounter of 03/03/16  CBC with Differential  Result Value Ref Range   WBC 12.1 (H) 3.6 - 11.0 K/uL   RBC 3.33 (L) 3.80 - 5.20 MIL/uL   Hemoglobin 10.3 (L) 12.0 - 16.0 g/dL   HCT 16.1 (L) 09.6 - 04.5 %   MCV 94.5 80.0 - 100.0  fL   MCH 30.9 26.0 - 34.0 pg   MCHC 32.7 32.0 - 36.0 g/dL   RDW 40.9 (H) 81.1 - 91.4 %   Platelets 254 150 - 440 K/uL   Neutrophils Relative % 83 %   Neutro Abs 10.1 (H) 1.4 - 6.5 K/uL   Lymphocytes Relative 10 %   Lymphs Abs 1.2 1.0 - 3.6 K/uL   Monocytes Relative 5 %   Monocytes Absolute 0.6 0.2 - 0.9 K/uL   Eosinophils Relative 1 %   Eosinophils Absolute 0.1 0 - 0.7 K/uL   Basophils Relative 1 %   Basophils Absolute 0.1 0 - 0.1 K/uL  Comprehensive metabolic panel  Result Value Ref Range   Sodium 137 135 - 145 mmol/L   Potassium 4.3 3.5 - 5.1 mmol/L   Chloride 101 101 - 111 mmol/L   CO2 29 22 - 32 mmol/L   Glucose, Bld 127 (H) 65 - 99 mg/dL   BUN 31 (H) 6 - 20 mg/dL   Creatinine, Ser 7.82 0.44 - 1.00 mg/dL   Calcium 9.4 8.9 - 95.6 mg/dL   Total Protein 8.4 (H) 6.5 - 8.1 g/dL   Albumin 4.3 3.5 - 5.0 g/dL   AST 26 15 - 41 U/L   ALT 17 14 - 54 U/L   Alkaline Phosphatase 65 38 - 126 U/L   Total Bilirubin 0.4 0.3 - 1.2 mg/dL   GFR calc non Af Amer >60 >60 mL/min   GFR calc Af Amer >60 >60 mL/min   Anion gap 7 5 - 15  Basic metabolic panel  Result Value Ref Range   Sodium 136 135 - 145 mmol/L   Potassium 4.1 3.5 - 5.1 mmol/L   Chloride 103 101 - 111 mmol/L   CO2 28 22 - 32 mmol/L   Glucose, Bld 132 (H) 65 - 99 mg/dL   BUN 23 (H) 6 - 20 mg/dL   Creatinine, Ser 2.13 0.44 - 1.00 mg/dL   Calcium 8.6 (L) 8.9 - 10.3 mg/dL   GFR calc non Af Amer >60 >60 mL/min   GFR calc Af Amer >60 >60 mL/min   Anion gap 5 5 - 15  CBC  Result Value Ref Range   WBC 8.4 3.6 - 11.0 K/uL   RBC 2.90 (L) 3.80 - 5.20 MIL/uL   Hemoglobin 9.1 (L) 12.0 - 16.0 g/dL   HCT 08.6 (L) 57.8 - 46.9 %   MCV 94.6 80.0 - 100.0 fL   MCH 31.4 26.0 - 34.0 pg   MCHC 33.2 32.0 - 36.0 g/dL   RDW 62.9 (H) 52.8 - 41.3 %   Platelets 228 150 - 440 K/uL  Type and screen [facility]  Result  Value Ref Range   ABO/RH(D) O POS    Antibody Screen NEG    Sample Expiration 03/06/2016    I  personally reviewed labs  Imaging: CT Thoracic Spine: Moderate compression fracture T7 appears acute. Mild retropulsion of bone into the canal without significant spinal stenosis. No other fracture.   CT Lumbar Spine: Mild compression fractures L2 and a third L3 appear chronic and unchanged from prior studies. No acute fracture  Impression/Plan:  Ms. Kirley is admitted after sustaining  Fall resulting in a thoracic compression fracture. She is having back pain and some radicular pain but remains neurologically intact. Given overall stable alignment and no retropulsion, conservative management is best approach is this is likely an osteoporotic fracture.    1.  Diagnosis: Thoracic Compression Fracture  2.  Plan  - Recommended upright films in TLSO which were obtained and shows stable alignment. There is some mild kyphosis that is increased over baseline.  - Recommend PT evaluation, keep brace on whenever upright.  - Pain control per primary team - Can follow up in [place] clinic in 2 weeks with brace, we will arrange appointment

## 2016-03-04 NOTE — Progress Notes (Signed)
Called [employer] [facility] place order for TLFO back brace, approx arrival time 2pm. Patient to be xrayed sitting with brace and placement.

## 2016-03-04 NOTE — NC FL2 (Signed)
[place] MEDICAID FL2 LEVEL OF CARE SCREENING TOOL     IDENTIFICATION  Patient Name: [name] Birthdate: 1927-05-26 Sex: female Admission Date (Current Location): 03/03/2016  [place] and [place] Number:  Chiropodist and Address:  [facility], [address] [place], [place] [address]      Provider Number: [id number]  Attending Physician Name and Address:  Delfino Lovett, MD  Relative Name and Phone Number:       Current Level of Care: Hospital Recommended Level of Care: Skilled Nursing Facility Prior Approval Number:    Date Approved/Denied:   PASRR Number:  [id number] )  Discharge Plan: SNF    Current Diagnoses: Patient Active Problem List   Diagnosis Date Noted  . Thoracic compression fracture, closed, initial encounter (HCC) 03/03/2016  . SBO (small bowel obstruction) 01/11/2016  . Small bowel obstruction 10/11/2014  . Intestinal obstruction due to adhesions 07/28/2014    Orientation RESPIRATION BLADDER Height & Weight     Self, Time, Situation, Place  O2 (5 Liters Oxygen ) Continent Weight: 166 lb 9.6 oz (75.6 kg) Height:  5\' 3"  (160 cm)  BEHAVIORAL SYMPTOMS/MOOD NEUROLOGICAL BOWEL NUTRITION STATUS   (none)  (none) Continent Diet (Diet: Heart Healthy )  AMBULATORY STATUS COMMUNICATION OF NEEDS Skin   Extensive Assist Verbally Normal                       Personal Care Assistance Level of Assistance  Bathing, Feeding, Dressing Bathing Assistance: Limited assistance Feeding assistance: Independent Dressing Assistance: Limited assistance     Functional Limitations Info  Sight, Hearing, Speech Sight Info: Adequate Hearing Info: Adequate Speech Info: Adequate    SPECIAL CARE FACTORS FREQUENCY  PT (By licensed PT), OT (By licensed OT)     PT Frequency:  (5) OT Frequency:  (5)            Contractures      Additional Factors Info  Code Status, Allergies Code Status Info:  (DNR ) Allergies Info:   (Toprol Xl Metoprolol, Ace Inhibitors, Diltiazem Hcl)           Current Medications (03/04/2016):  This is the current hospital active medication list Current Facility-Administered Medications  Medication Dose Route Frequency Provider Last Rate Last Dose  . 0.9 %  sodium chloride infusion   Intravenous Continuous Alexis Hugelmeyer, DO 75 mL/hr at 03/04/16 0006    . acetaminophen (TYLENOL) tablet 650 mg  650 mg Oral Q6H PRN Alexis Hugelmeyer, DO       Or  . acetaminophen (TYLENOL) suppository 650 mg  650 mg Rectal Q6H PRN Alexis Hugelmeyer, DO      . albuterol (PROVENTIL) (2.5 MG/3ML) 0.083% nebulizer solution 2.5 mg  2.5 mg Nebulization Q6H PRN Alexis Hugelmeyer, DO      . albuterol (PROVENTIL) (2.5 MG/3ML) 0.083% nebulizer solution 2.5 mg  2.5 mg Inhalation BID Alexis Hugelmeyer, DO      . amitriptyline (ELAVIL) tablet 75 mg  75 mg Oral QHS Alexis Hugelmeyer, DO      . aspirin chewable tablet 81 mg  81 mg Oral Daily Alexis Hugelmeyer, DO      . bisacodyl (DULCOLAX) EC tablet 5 mg  5 mg Oral Daily PRN Alexis Hugelmeyer, DO      . celecoxib (CELEBREX) capsule 200 mg  200 mg Oral Daily Alexis Hugelmeyer, DO      . digoxin (LANOXIN) tablet 0.125 mg  0.125 mg Oral Daily Alexis Hugelmeyer, DO      .  furosemide (LASIX) tablet 40 mg  40 mg Oral Daily Alexis Hugelmeyer, DO      . heparin injection 5,000 Units  5,000 Units Subcutaneous Q8H Alexis Hugelmeyer, DO   5,000 Units at 03/04/16 0507  . lidocaine (LIDODERM) 5 % 1 patch  1 patch Transdermal Q24H Alexis Hugelmeyer, DO   1 patch at 03/04/16 0844  . losartan (COZAAR) tablet 25 mg  25 mg Oral Daily Alexis Hugelmeyer, DO      . magnesium citrate solution 1 Bottle  1 Bottle Oral Once PRN Alexis Hugelmeyer, DO      . mometasone-formoterol (DULERA) 100-5 MCG/ACT inhaler 2 puff  2 puff Inhalation BID Alexis Hugelmeyer, DO      . morphine 2 MG/ML injection 1 mg  1 mg Intravenous Q4H PRN Alexis Hugelmeyer, DO   1 mg at 03/04/16 0005  . ondansetron  (ZOFRAN) tablet 4 mg  4 mg Oral Q6H PRN Alexis Hugelmeyer, DO       Or  . ondansetron (ZOFRAN) injection 4 mg  4 mg Intravenous Q6H PRN Alexis Hugelmeyer, DO      . oxyCODONE (Oxy IR/ROXICODONE) immediate release tablet 5 mg  5 mg Oral Q4H PRN Alexis Hugelmeyer, DO   5 mg at 03/04/16 1545  . pantoprazole (PROTONIX) EC tablet 40 mg  40 mg Oral Daily Alexis Hugelmeyer, DO      . senna-docusate (Senokot-S) tablet 1 tablet  1 tablet Oral QHS PRN Alexis Hugelmeyer, DO      . tiotropium (SPIRIVA) inhalation capsule 18 mcg  18 mcg Inhalation Daily Alexis Hugelmeyer, DO         Discharge Medications: Please see discharge summary for a list of discharge medications.  Relevant Imaging Results:  Relevant Lab Results:   Additional Information  (SSN: [id number])  Copper Kirtley, Darleen Crocker, LCSW

## 2016-03-04 NOTE — Progress Notes (Signed)
PT Cancellation Note  Patient Details Name: Toni MELVINIA WILLERS MRN: [id number] DOB: 06/26/1927   Cancelled Treatment:    Reason Eval/Treat Not Completed: Other (comment).  Pt to have brace delivered around 2pm this afternoon and then will have x-ray completed with brace in place.  Will hold PT until x-ray completed and final decision made.   Encarnacion Chu PT, DPT 03/04/2016, 11:51 AM

## 2016-03-04 NOTE — Evaluation (Signed)
Physical Therapy Evaluation Patient Details Name: [name] MRN: [id number] DOB: November 15, 1927 Today's Date: 03/04/2016   History of Present Illness  Pt is a 29 who presented after a fall landing backwards. Imaging revealed compression fx T7 with mild retropulsion of bone into the canal.  TLSO applied and x-ray taken with pt in brace.  Plan is for conservative management.  Pt's PMH includes a-fib, COPD, CHF.      Clinical Impression  Pt admitted with above diagnosis. Pt currently with functional limitations due to the deficits listed below (see PT Problem List). [name] presents with pain and instability when upright secondary to the above diagnosis.  Instruction on log roll technique which pt performed with increased time and effort. She requires min assist for sit<>stand transfers and to steady during pivot transfer to the chair.  She lives at home alone and given pt's current mobility status, recommending SNF at d/c.  Pt will benefit from skilled PT to increase their independence and safety with mobility to allow discharge to the venue listed below.      Follow Up Recommendations SNF    Equipment Recommendations  None recommended by PT    Recommendations for Other Services OT consult     Precautions / Restrictions Precautions Precautions: Fall;Back Precaution Booklet Issued: No Precaution Comments: Instructed pt in back precautions: no bending, arching, twisting Required Braces or Orthoses: Spinal Brace Spinal Brace: Thoracolumbosacral orthotic (pt wearing brace upon arrival with pt in supine) Restrictions Weight Bearing Restrictions: No      Mobility  Bed Mobility Overal bed mobility: Needs Assistance Bed Mobility: Rolling;Sidelying to Sit Rolling: Min guard Sidelying to sit: Min guard       General bed mobility comments: Cues for log roll technique with increased time and effort as well as use of bed rail  Transfers Overall transfer level: Needs  assistance Equipment used: Rolling walker (2 wheeled) Transfers: Sit to/from [employer] Sit to Stand: Min assist Stand pivot transfers: Min assist       General transfer comment: Min assist to boost to standing and to steady during pivot.  Cues for upright posture.  Cues for hand placement.  Ambulation/Gait             General Gait Details: Unable to attempt due to fatigue and pain.  Stairs            Wheelchair Mobility    Modified Rankin (Stroke Patients Only)       Balance Overall balance assessment: Needs assistance;History of Falls Sitting-balance support: No upper extremity supported;Feet supported Sitting balance-Leahy Scale: Good     Standing balance support: Bilateral upper extremity supported;During functional activity Standing balance-Leahy Scale: Poor Standing balance comment: Requires UE support for static and dynamic activities                             Pertinent Vitals/Pain Pain Assessment: 0-10 Pain Score: 5  Pain Location: back, headache Pain Descriptors / Indicators: Aching;Headache Pain Intervention(s): Limited activity within patient's tolerance;Monitored during session;Repositioned;Patient requesting pain meds-RN notified    Home Living Family/patient expects to be discharged to:: Skilled nursing facility Living Arrangements: Alone Available Help at Discharge: Family Type of Home: House Home Access: Ramped entrance     Home Layout: One level Home Equipment: Environmental consultant - 4 wheels;Walker - 2 wheels;Cane - single point Additional Comments: Son and daughter in law live 2 houses down    Prior Function Level of  Independence: Needs assistance   Gait / Transfers Assistance Needed: Ambulates with rollator at baseline with last reported fall (prior to this one) was over a year ago.  ADL's / Homemaking Assistance Needed: Son assists with cleaning and with some cooking.  Pt independently takes a sponge bath,  "that's how I grew up doing it".        Hand Dominance   Dominant Hand: Right    Extremity/Trunk Assessment   Upper Extremity Assessment Upper Extremity Assessment: Overall WFL for tasks assessed    Lower Extremity Assessment Lower Extremity Assessment: RLE deficits/detail RLE Deficits / Details: R knee flexion strength 4/5 RLE Sensation:  (WNL)    Cervical / Trunk Assessment Cervical / Trunk Assessment: Other exceptions Cervical / Trunk Exceptions: s/p compression fx T7   Communication   Communication: No difficulties  Cognition Arousal/Alertness: Awake/alert Behavior During Therapy: WFL for tasks assessed/performed Overall Cognitive Status: Within Functional Limits for tasks assessed                      General Comments General comments (skin integrity, edema, etc.): Spoke with [name] prior to evaluation who confirmed plan is for conservative management with brace.  Pt's SpO2 at or above 91% on 2L O2 throughout session.  Pt wears 2L O2 at night at baseline.    Exercises Other Exercises Other Exercises: Instructed pt to always have her feet supported (no dangling) when seated.   Assessment/Plan    PT Assessment Patient needs continued PT services  PT Problem List Decreased strength;Decreased activity tolerance;Decreased balance;Decreased mobility;Decreased knowledge of use of DME;Decreased safety awareness;Pain          PT Treatment Interventions DME instruction;Gait training;Functional mobility training;Therapeutic activities;Therapeutic exercise;Balance training;Patient/family education;Modalities    PT Goals (Current goals can be found in the Care Plan section)  Acute Rehab PT Goals Patient Stated Goal: rehab before home PT Goal Formulation: With patient/family Time For Goal Achievement: 03/18/16 Potential to Achieve Goals: Good    Frequency 7X/week   Barriers to discharge Decreased caregiver support Lives alone    Co-evaluation                End of Session Equipment Utilized During Treatment: Gait belt;Back brace;Oxygen Activity Tolerance: Patient limited by fatigue;Patient limited by pain Patient left: in chair;with call bell/phone within reach;with chair alarm set;with nursing/sitter in room;with family/visitor present Nurse Communication: Mobility status;Precautions;Other (comment) (pt to perform log roll for bed mobility)         Time: 2542-7062 PT Time Calculation (min) (ACUTE ONLY): 29 min   Charges:   PT Evaluation $PT Eval Low Complexity: 1 Procedure PT Treatments $Therapeutic Activity: 8-22 mins   PT G Codes:        Encarnacion Chu PT, DPT 03/04/2016, 4:23 PM

## 2016-03-05 MED ORDER — OXYCODONE HCL 5 MG PO TABS
5.0000 mg | ORAL_TABLET | Freq: Four times a day (QID) | ORAL | Status: DC | PRN
Start: 2016-03-05 — End: 2016-03-05

## 2016-03-05 MED ORDER — BISACODYL 5 MG PO TBEC
5.0000 mg | DELAYED_RELEASE_TABLET | Freq: Every day | ORAL | Status: DC
  Administered 2016-03-05 – 2016-03-06 (×2): 5 mg via ORAL
  Filled 2016-03-05 (×2): qty 1

## 2016-03-05 MED ORDER — OXYCODONE HCL 5 MG PO TABS
5.0000 mg | ORAL_TABLET | Freq: Two times a day (BID) | ORAL | Status: DC | PRN
  Administered 2016-03-05: 5 mg via ORAL
  Filled 2016-03-05: qty 1

## 2016-03-05 MED ORDER — SENNOSIDES-DOCUSATE SODIUM 8.6-50 MG PO TABS
2.0000 | ORAL_TABLET | Freq: Two times a day (BID) | ORAL | Status: DC
  Administered 2016-03-05 – 2016-03-06 (×2): 2 via ORAL
  Filled 2016-03-05 (×2): qty 2

## 2016-03-05 MED ORDER — MELOXICAM 7.5 MG PO TABS: 15.0000 mg | ORAL_TABLET | Freq: Four times a day (QID) | ORAL | Status: DC | PRN

## 2016-03-05 MED ORDER — CYCLOBENZAPRINE HCL 5 MG PO TABS
7.5000 mg | ORAL_TABLET | Freq: Three times a day (TID) | ORAL | Status: DC
  Administered 2016-03-05 – 2016-03-06 (×3): 7.5 mg via ORAL
  Filled 2016-03-05 (×6): qty 1.5

## 2016-03-05 MED ORDER — CYCLOBENZAPRINE HCL 10 MG PO TABS
5.0000 mg | ORAL_TABLET | Freq: Three times a day (TID) | ORAL | Status: DC | PRN
  Administered 2016-03-05: 5 mg via ORAL
  Filled 2016-03-05: qty 1

## 2016-03-05 MED ORDER — ENOXAPARIN SODIUM 40 MG/0.4ML ~~LOC~~ SOLN
40.0000 mg | SUBCUTANEOUS | Status: DC
  Administered 2016-03-05: 40 mg via SUBCUTANEOUS
  Filled 2016-03-05: qty 0.4

## 2016-03-05 MED ORDER — ALUM & MAG HYDROXIDE-SIMETH 200-200-20 MG/5ML PO SUSP
30.0000 mL | Freq: Four times a day (QID) | ORAL | Status: DC | PRN
  Administered 2016-03-05 – 2016-03-06 (×2): 30 mL via ORAL
  Filled 2016-03-05 (×2): qty 30

## 2016-03-05 MED ORDER — IBUPROFEN 400 MG PO TABS
400.0000 mg | ORAL_TABLET | Freq: Four times a day (QID) | ORAL | Status: DC
  Administered 2016-03-05 – 2016-03-06 (×5): 400 mg via ORAL
  Filled 2016-03-05 (×5): qty 1

## 2016-03-05 MED ORDER — ALBUTEROL SULFATE (2.5 MG/3ML) 0.083% IN NEBU
2.5000 mg | INHALATION_SOLUTION | Freq: Two times a day (BID) | RESPIRATORY_TRACT | Status: DC
  Administered 2016-03-05: 2.5 mg via RESPIRATORY_TRACT
  Filled 2016-03-05 (×2): qty 3

## 2016-03-05 NOTE — Progress Notes (Signed)
Physical Therapy Treatment Patient Details Name: [name] MRN: [id number] DOB: 08-Jun-1927 Today's Date: 03/05/2016    History of Present Illness Pt is a 89 who presented after a fall landing backwards. Imaging revealed compression fx T7 with mild retropulsion of bone into the canal.  TLSO applied and x-ray taken with pt in brace.  Plan is for conservative management.  Pt's PMH includes a-fib, COPD, CHF.      PT Comments    Pt ready for session.  Participated in exercises as described below.  Pt initially doing well with session and mobility and when asked to walk in room stated "I want to try". After seated rest pt began to have stomach cramping limiting her ability to participate.  Primary nurse in room and contacted MD for additional medications.  She was left sitting in recliner with her feet down.  Will continue to increase mobility as appropriate.  Anticipate SNF discharge tomorrow.   Follow Up Recommendations  SNF     Equipment Recommendations  None recommended by PT    Recommendations for Other Services OT consult     Precautions / Restrictions Precautions Precautions: Fall;Back Precaution Booklet Issued: No Precaution Comments: reviewed precautions - needed verbal cues to recall Required Braces or Orthoses: Spinal Brace Spinal Brace: Thoracolumbosacral orthotic Restrictions Weight Bearing Restrictions: No    Mobility  Bed Mobility Overal bed mobility: Needs Assistance Bed Mobility: Rolling;Sidelying to Sit Rolling: Min guard Sidelying to sit: Min guard       General bed mobility comments: Cues for log roll technique with increased time and effort as well as use of bed rail  Transfers Overall transfer level: Needs assistance Equipment used: Rolling walker (2 wheeled) Transfers: Sit to/from Stand Sit to Stand: Min assist;+2 physical assistance Stand pivot transfers: Min assist       General transfer comment: few steps to  chair.  Ambulation/Gait Ambulation/Gait assistance: Min assist;+2 physical assistance   Assistive device: Rolling walker (2 wheeled) Gait Pattern/deviations: Step-to pattern   Gait velocity interpretation: Below normal speed for age/gender General Gait Details: Pt was able to take steps to chair.  Wanted to try to ambulate today but stomach muscle spasms and increased pain limited her ablility to ambulate further   Stairs            Wheelchair Mobility    Modified Rankin (Stroke Patients Only)       Balance Overall balance assessment: Needs assistance;History of Falls Sitting-balance support: No upper extremity supported;Feet supported Sitting balance-Leahy Scale: Good     Standing balance support: Bilateral upper extremity supported;During functional activity Standing balance-Leahy Scale: Poor Standing balance comment: Requires UE support for static and dynamic activities                    Cognition Arousal/Alertness: Awake/alert Behavior During Therapy: WFL for tasks assessed/performed Overall Cognitive Status: Within Functional Limits for tasks assessed                      Exercises Other Exercises Other Exercises: Instructed pt to always have her feet supported (no dangling) when seated. Other Exercises: ankle pumps and heels slides in supine bilaterally x 10    General Comments        Pertinent Vitals/Pain Pain Score: 7  Pain Location: left abdominal muscle cramps Pain Descriptors / Indicators: Sharp;Spasm Pain Intervention(s): Other (comment)    Home Living  Prior Function            PT Goals (current goals can now be found in the care plan section) Acute Rehab PT Goals Patient Stated Goal: rehab before home Progress towards PT goals: Progressing toward goals    Frequency    7X/week      PT Plan      Co-evaluation             End of Session Equipment Utilized During Treatment:  Gait belt;Back brace;Oxygen Activity Tolerance: Patient limited by fatigue;Patient limited by pain Patient left: in chair;with call bell/phone within reach;with chair alarm set;with nursing/sitter in room     Time: 0786-7544 PT Time Calculation (min) (ACUTE ONLY): 23 min  Charges:  $Gait Training: 8-22 mins $Therapeutic Activity: 8-22 mins                    G Codes:      Danielle Dess 04/03/16, 10:16 AM

## 2016-03-05 NOTE — Clinical Social Work Placement (Signed)
   CLINICAL SOCIAL WORK PLACEMENT  NOTE  Date:  03/05/2016  Patient Details  Name: [name] MRN: [id number] Date of Birth: 06/28/1927  Clinical Social Work is seeking post-discharge placement for this patient at the Skilled  Nursing Facility level of care (*CSW will initial, date and re-position this form in  chart as items are completed):  Yes   Patient/family provided with [place] Clinical Social Work Department's list of facilities offering this level of care within the geographic area requested by the patient (or if unable, by the patient's family).  Yes   Patient/family informed of their freedom to choose among providers that offer the needed level of care, that participate in Medicare, Medicaid or managed care program needed by the patient, have an available bed and are willing to accept the patient.  Yes   Patient/family informed of [place]'s ownership interest in [facility] and [facility], as well as of the fact that they are under no obligation to receive care at these facilities.  PASRR submitted to EDS on 03/04/16     PASRR number received on 03/04/16     Existing PASRR number confirmed on       FL2 transmitted to all facilities in geographic area requested by pt/family on 03/04/16     FL2 transmitted to all facilities within larger geographic area on       Patient informed that his/her managed care company has contracts with or will negotiate with certain facilities, including the following:        Yes   Patient/family informed of bed offers received.  Patient chooses bed at  [facility])     Physician recommends and patient chooses bed at      Patient to be transferred to   on  .  Patient to be transferred to facility by       Patient family notified on   of transfer.  Name of family member notified:        PHYSICIAN       Additional Comment:    _______________________________________________ Ralene Bathe, Student-Social  Work 03/05/2016, 10:07 AM

## 2016-03-05 NOTE — Progress Notes (Signed)
Paged MD r/t medication, Hold losartan, continue to give lasix, and digoxin. New order for mobic for muscle spasms.

## 2016-03-05 NOTE — Progress Notes (Signed)
Pt. C/o heartburn. Maalox 30cc q6h prn ordered.

## 2016-03-05 NOTE — Clinical Social Work Note (Addendum)
Clinical Social Work Assessment  Patient Details  Name: [name] MRN: [id number] Date of Birth: 1927-12-28  Date of referral:  03/05/16               Reason for consult:  Discharge Planning, Facility Placement                Permission sought to share information with:  Chartered certified accountant granted to share information::  Yes, Verbal Permission Granted  Name::      Chief Executive Officer::   Freedom   Relationship::     Contact Information:     Housing/Transportation Living arrangements for the past 2 months:  [place] of Information:  Patient Patient Interpreter Needed:  None Criminal Activity/Legal Involvement Pertinent to Current Situation/Hospitalization:  No - Comment as needed Significant Relationships:  Adult Children Lives with:  Self Do you feel safe going back to the place where you live?  Yes Need for family participation in patient care:  Yes (Comment)  Care giving concerns:  Per patient, she lives in [place], [place] at her house alone.  Social Worker assessment / plan:  Holiday representative (CSW) received social work consult. PT is recommended SNF. Patient was alert and oriented x4. Social work Theatre manager met with patient has bedside. Patient had just finished working with PT. Per patient, she lives at her house in [place], [place] alone. Patient has two sons, Shanon Brow and Annie Main that also live in [place]. One son lives two doors down from her. Social work explained that patient can be discharged tomorrow, as she will meet the 3 night inpatient stay for Medicare. Social work Theatre manager gave patient bed offers. Patient has chosen to go to [facility]. Seth Bake, admissions coordinator at [facility] is aware of patients decision. Patient will be discharged tomorrow. Sons Shanon Brow and Annie Main are patient's HPOA. Social work Theatre manager answered sons questions about Medicare paying for SNF stays 1-20 at 100%, 21-100 at 80% and a three night inpatient  qualification stay at [facility]. Patient was admitted to inpatient on 03/04/16. Patient's son verbally agreed he understood.   FL2 completed and faxed out.     Employment status:  Unemployed Forensic scientist:  Medicare PT Recommendations:  [place] / Referral to community resources:  [place]  Patient/Family's Response to care:  Patient was agreeable to go to [facility] for rehab.   Patient/Family's Understanding of and Emotional Response to Diagnosis, Current Treatment, and Prognosis:  Patient was pleasant and thanked social work Theatre manager for coming by.   Emotional Assessment Appearance:  Appears stated age Attitude/Demeanor/Rapport:    Affect (typically observed):  Accepting, Adaptable, Appropriate Orientation:  Oriented to Self, Oriented to Place, Oriented to  Time, Oriented to Situation Alcohol / Substance use:  Not Applicable Psych involvement (Current and /or in the community):  No (Comment)  Discharge Needs  Concerns to be addressed:  Basic Needs Readmission within the last 30 days:  No Current discharge risk:  Dependent with Mobility Barriers to Discharge:  Continued Medical Work up   [employer], [place] Part Work 03/05/2016, 9:57 AM

## 2016-03-05 NOTE — Progress Notes (Signed)
[facility] [place] [facility]   PATIENT NAME: [name]    MR#:  [id number]  DATE OF BIRTH:  14-Aug-1927  SUBJECTIVE:  CHIEF COMPLAINT:   Chief Complaint  Patient presents with  . Fall  Feeling much better, some muscle spasm REVIEW OF SYSTEMS:  Review of Systems  Constitutional: Positive for malaise/fatigue. Negative for chills, fever and weight loss.  HENT: Negative for nosebleeds and sore throat.   Eyes: Negative for blurred vision.  Respiratory: Negative for cough, shortness of breath and wheezing.   Cardiovascular: Negative for chest pain, orthopnea, leg swelling and PND.  Gastrointestinal: Negative for abdominal pain, constipation, diarrhea, heartburn, nausea and vomiting.  Genitourinary: Negative for dysuria and urgency.  Musculoskeletal: Positive for back pain and falls.  Skin: Negative for rash.  Neurological: Positive for weakness. Negative for dizziness, speech change, focal weakness and headaches.  Endo/Heme/Allergies: Does not bruise/bleed easily.  Psychiatric/Behavioral: Negative for depression.    DRUG ALLERGIES:   Allergies  Allergen Reactions  . Toprol Xl [Metoprolol] Other (See Comments)    Reaction: skin pulls away from the bottom of feet  . Ace Inhibitors Cough  . Diltiazem Hcl Other (See Comments)   VITALS:  Blood pressure (!) 112/55, pulse 70, temperature 97.9 F (36.6 C), temperature source Oral, resp. rate 19, height 5\' 3"  (1.6 m), weight 75.6 kg (166 lb 9.6 oz), SpO2 91 %. PHYSICAL EXAMINATION:  Physical Exam  Constitutional: She is oriented to person, place, and time and well-developed, well-nourished, and in no distress.  HENT:  Head: Normocephalic and atraumatic.  Eyes: Conjunctivae and EOM are normal. Pupils are equal, round, and reactive to light.  Neck: Normal range of motion. Neck supple. No tracheal deviation present. No thyromegaly present.  Cardiovascular: Normal rate, regular rhythm and normal heart sounds.    Pulmonary/Chest: Effort normal and breath sounds normal. No respiratory distress. She has no wheezes. She exhibits no tenderness.  Abdominal: Soft. Bowel sounds are normal. She exhibits no distension. There is no tenderness.  Musculoskeletal:       Lumbar back: She exhibits decreased range of motion, tenderness and bony tenderness.  TLSO brace in place  Neurological: She is alert and oriented to person, place, and time. No cranial nerve deficit.  Skin: Skin is warm and dry. No rash noted.  Psychiatric: Mood and affect normal.   LABORATORY PANEL:   CBC  Recent Labs Lab 03/04/16 0412  WBC 8.4  HGB 9.1*  HCT 27.5*  PLT 228   ------------------------------------------------------------------------------------------------------------------ Chemistries   Recent Labs Lab 03/03/16 1958 03/04/16 0412  NA 137 136  K 4.3 4.1  CL 101 103  CO2 29 28  GLUCOSE 127* 132*  BUN 31* 23*  CREATININE 0.69 0.57  CALCIUM 9.4 8.6*  AST 26  --   ALT 17  --   ALKPHOS 65  --   BILITOT 0.4  --    RADIOLOGY:  No results found. ASSESSMENT AND PLAN:  This is a 83 y.o. female with a history of afib, CHF, COPD (uses home O2)  now being admitted with:  1. Intractable back pain secondary to acute T7 compression fracture with mild retropulsion - continue pain meds for pain control.  - Appreciate Neurosurgery input. nonsurgical and outpt f/up in 2 weeks -  TLSO brace -Foley catheter removed - Physical therapist Recommends rehabilitation - We will cut back on all narcotics, stopped morphine considering her known history of bowel obstruction and severe constipation - We will start her  on scheduled dose of ibuprofen and Flexeril  * Constipation: - Last reported bowel movement on fifth of January - We will probably schedule.  Senokot twice a day along with Dulcolax and as needed Mag citrate - Cut back on narcotics 2. Anemia, chronic and stable  3. History of COPD-continue Spiriva, Advair,  albuterol. 4. History of osteoarthritis-continue Celebrex. 5. History of atrial fibrillation-continue digoxin. 6. History of hypertension-continue Cozaar  7. History of GERD-continue Prilosec and Protonix 8. History of CHF-continue Lasix     All the records are reviewed and case discussed with Care Management/Social Worker. Management plans discussed with the patient, family and they are in agreement.  CODE STATUS: DNR  TOTAL TIME TAKING CARE OF THIS PATIENT: 35 minutes.   More than 50% of the time was spent in counseling/coordination of care: YES  POSSIBLE D/C IN AM, DEPENDING ON CLINICAL CONDITION.  She needs 3 MN stay per medical rule for placement   Delfino Lovett M.D on 03/05/2016 at 4:34 PM  Between 7am to 6pm - Pager - [phone]  After 6pm go to [email or web address] - Social research officer, government  [facility]  Office  [phone]  CC: Primary care physician; Lynnea Ferrier, MD  Note: This dictation was prepared with Dragon dictation along with smaller phrase technology. Any transcriptional errors that result from this process are unintentional.

## 2016-03-06 MED ORDER — SENNOSIDES-DOCUSATE SODIUM 8.6-50 MG PO TABS: 2.0000 | ORAL_TABLET | Freq: Every evening | ORAL | 0 refills | Status: AC | PRN

## 2016-03-06 MED ORDER — IBUPROFEN 400 MG PO TABS: 400.0000 mg | ORAL_TABLET | Freq: Four times a day (QID) | ORAL | 0 refills | Status: AC | PRN

## 2016-03-06 MED ORDER — OXYCODONE HCL 5 MG PO TABS: 5.0000 mg | ORAL_TABLET | Freq: Two times a day (BID) | ORAL | 0 refills | Status: AC | PRN

## 2016-03-06 MED ORDER — CYCLOBENZAPRINE HCL 5 MG PO TABS: 5.0000 mg | ORAL_TABLET | Freq: Three times a day (TID) | ORAL | 0 refills | Status: AC | PRN

## 2016-03-06 MED ORDER — FLEET ENEMA 7-19 GM/118ML RE ENEM
1.0000 | ENEMA | RECTAL | Status: AC
Start: 2016-03-06 — End: 2016-03-06
  Administered 2016-03-06: 1 via RECTAL

## 2016-03-06 MED ORDER — ALUM & MAG HYDROXIDE-SIMETH 200-200-20 MG/5ML PO SUSP: 30.0000 mL | ORAL | Status: DC

## 2016-03-06 NOTE — Clinical Social Work Placement (Signed)
   CLINICAL SOCIAL WORK PLACEMENT  NOTE  Date:  03/06/2016  Patient Details  Name: [name] MRN: [id number] Date of Birth: 15-Apr-1927  Clinical Social Work is seeking post-discharge placement for this patient at the Skilled  Nursing Facility level of care (*CSW will initial, date and re-position this form in  chart as items are completed):  Yes   Patient/family provided with [place] Clinical Social Work Department's list of facilities offering this level of care within the geographic area requested by the patient (or if unable, by the patient's family).  Yes   Patient/family informed of their freedom to choose among providers that offer the needed level of care, that participate in Medicare, Medicaid or managed care program needed by the patient, have an available bed and are willing to accept the patient.  Yes   Patient/family informed of 's ownership interest in [facility] and [facility], as well as of the fact that they are under no obligation to receive care at these facilities.  PASRR submitted to EDS on 03/04/16     PASRR number received on 03/04/16     Existing PASRR number confirmed on       FL2 transmitted to all facilities in geographic area requested by pt/family on 03/04/16     FL2 transmitted to all facilities within larger geographic area on       Patient informed that his/her managed care company has contracts with or will negotiate with certain facilities, including the following:        Yes   Patient/family informed of bed offers received.  Patient chooses bed at  [facility])     Physician recommends and patient chooses bed at      Patient to be transferred to  [facility] ) on 03/06/16.  Patient to be transferred to facility by  [facility] )     Patient family notified on 03/06/16 of transfer.  Name of family member notified:   (Patient's son Onalee Hua is aware of D/C today. )     PHYSICIAN       Additional Comment:     _______________________________________________ [name], Darleen Crocker, LCSW 03/06/2016, 11:40 AM

## 2016-03-06 NOTE — Progress Notes (Signed)
Called report to [place], LPN at [facility], answered all questions. EMS called for transport.

## 2016-03-06 NOTE — Discharge Instructions (Signed)
Vertebral Fracture °Introduction °A vertebral fracture means that one of the bones in the spine is broken (fractured). These bones are called vertebrae. You may have back pain that gets worse when you move. °Vertebral fractures can be mild or severe. Many will get better without surgery. Surgery may be needed for more severe breaks. °Follow these instructions at home: °General instructions °· Take medicines only as told by your doctor. °· Do not drive or use heavy machinery while taking pain medicine. °· If told, put ice on the injured area: °¨ Put ice in a plastic bag. °¨ Place a towel between your skin and the bag. °¨ Leave the ice on for 30 minutes every 2 hours at first. Then use the ice as needed. °· Wear your neck brace or back brace as told by your doctor. °· Do not drink alcohol. °· Keep all follow-up visits as told by your doctor. This is important. °Activity °· Stay in bed (on bed rest) only as told by your doctor. Being on bed rest for too long can make your condition worse. °· Return to your normal activities as told by your doctor. Ask your doctor what is safe for you to do. °· Do exercises for your back (physical therapy) as told by your doctor. °· Exercise often as told by your doctor. °Contact a doctor if: °· You have a fever. °· You have a cough that makes your pain worse. °· Your pain medicine is not helping. °· Your pain does not get better over time. °· You cannot return to your normal activities as planned. °Get help right away if: °· Your pain is very bad and it suddenly gets worse. °· You are not able to move any body part (paralysis) that is below the level of your injury. °· You have numbness, tingling, or weakness in any body part that is below the level of your injury. °· You cannot control when you pee (urinate) or when you poop (have bowel movements). °This information is not intended to replace advice given to you by your health care provider. Make sure you discuss any questions you  have with your health care provider. °Document Released: 08/01/2009 Document Revised: 07/20/2015 Document Reviewed: 02/16/2014 °© 2017 Elsevier ° °

## 2016-03-06 NOTE — Discharge Summary (Signed)
[facility] - [place] at [facility]   PATIENT NAME: [name]    MR#:  [id number]  DATE OF BIRTH:  1927-10-15  DATE OF ADMISSION:  03/03/2016   ADMITTING PHYSICIAN: Tonye Royalty, DO  DATE OF DISCHARGE: 03/06/2016  PRIMARY CARE PHYSICIAN: Curtis Sites III, MD   ADMISSION DIAGNOSIS:  Compression fracture of body of thoracic vertebra (HCC) [I96.78LF] DISCHARGE DIAGNOSIS:  Active Problems:   Thoracic compression fracture, closed, initial encounter (HCC)  SECONDARY DIAGNOSIS:   Past Medical History:  Diagnosis Date  . AICD (automatic cardioverter/defibrillator) present   . Anemia   . Arthritis   . Asthma   . Atrial fibrillation (HCC)    elected not to take coumadin  . Bacteremia 2007   asystole pacemaker failed to capture due to lead wire  . Bowel obstruction    multiple  . Cardiomyopathy (HCC) 1990  . CHF (congestive heart failure) (HCC)   . COPD (chronic obstructive pulmonary disease) (HCC)   . Diverticulosis   . Emphysema of lung (HCC)   . GERD (gastroesophageal reflux disease)   . History of IBS   . Multiple gastric ulcers    in recent years  . Presence of permanent cardiac pacemaker   . Restless leg syndrome   . Shortness of breath dyspnea    with exertion  . 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 Ulcerative colitis [facility])    2007   HOSPITAL COURSE:   ASSESSMENT AND PLAN:  This is a 83 y.o.femalewith a history of afib, CHF, COPD (uses home O2)admitted with:  1. Acute T7 compression fracture with mild retropulsion - continue pain meds for pain control.  - Appreciate Neurosurgery input. nonsurgical approach and outpt f/up in 2 weeks -  TLSO brace - prn ibuprofen and Flexeril - Avoid narcotics if possible  * Constipation: - resolved. avoid narcotics 2. Anemia, chronic and stable  3. History of COPD-continue Spiriva, Advair, albuterol. 4. History of osteoarthritis-continue Celebrex. 5. History of atrial  fibrillation-continue digoxin. 6. History of hypertension-continue Cozaar  7. History of GERD-continue Prilosec and Protonix 8. History of CHF-continue Lasix   DISCHARGE CONDITIONS:  stable CONSULTS OBTAINED:   DRUG ALLERGIES:   Allergies  Allergen Reactions  . Toprol Xl [Metoprolol] Other (See Comments)    Reaction: skin pulls away from the bottom of feet  . Ace Inhibitors Cough  . Diltiazem Hcl Other (See Comments)   DISCHARGE MEDICATIONS:   Allergies as of 03/06/2016      Reactions   Toprol Xl [metoprolol] Other (See Comments)   Reaction: skin pulls away from the bottom of feet   Ace Inhibitors Cough   Diltiazem Hcl Other (See Comments)      Medication List    TAKE these medications   albuterol 108 (90 Base) MCG/ACT inhaler Commonly known as:  PROVENTIL HFA;VENTOLIN HFA Inhale 2 puffs into the lungs 2 (two) times daily.   amitriptyline 75 MG tablet Commonly known as:  ELAVIL Take 75 mg by mouth at bedtime.   aspirin 81 MG chewable tablet Chew 81 mg by mouth daily.   celecoxib 200 MG capsule Commonly known as:  CELEBREX Take 200 mg by mouth 2 (two) times daily.   cyclobenzaprine 5 MG tablet Commonly known as:  FLEXERIL Take 1 tablet (5 mg total) by mouth 3 (three) times daily as needed for muscle spasms.   digoxin 0.125 MG tablet Commonly known as:  LANOXIN Take 0.125 mg by mouth  daily.   Fluticasone-Salmeterol 100-50 MCG/DOSE Aepb Commonly known as:  ADVAIR Inhale 1 puff into the lungs 2 (two) times daily.   furosemide 20 MG tablet Commonly known as:  LASIX Take 60 mg by mouth daily.   ibuprofen 400 MG tablet Commonly known as:  ADVIL,MOTRIN Take 1 tablet (400 mg total) by mouth every 6 (six) hours as needed.   losartan 25 MG tablet Commonly known as:  COZAAR Take 25 mg by mouth daily.   omeprazole 20 MG capsule Commonly known as:  PRILOSEC Take 20 mg by mouth daily.   oxyCODONE 5 MG immediate release tablet Commonly known as:  Oxy  IR/ROXICODONE Take 1 tablet (5 mg total) by mouth every 12 (twelve) hours as needed for moderate pain or severe pain.   pantoprazole 20 MG tablet Commonly known as:  PROTONIX Take 20 mg by mouth at bedtime.   polyethylene glycol packet Commonly known as:  MIRALAX / GLYCOLAX Take 17 g by mouth daily.   senna-docusate 8.6-50 MG tablet Commonly known as:  Senokot-S Take 2 tablets by mouth at bedtime as needed for mild constipation.   tiotropium 18 MCG inhalation capsule Commonly known as:  SPIRIVA Place 18 mcg into inhaler and inhale daily.      DISCHARGE INSTRUCTIONS:   DIET:  Regular diet DISCHARGE CONDITION:  Good ACTIVITY:  Activity as tolerated OXYGEN:  Home Oxygen: No.  Oxygen Delivery: room air DISCHARGE LOCATION:  nursing home   If you experience worsening of your admission symptoms, develop shortness of breath, life threatening emergency, suicidal or homicidal thoughts you must seek medical attention immediately by calling 911 or calling your MD immediately  if symptoms less severe.  You Must read complete instructions/literature along with all the possible adverse reactions/side effects for all the Medicines you take and that have been prescribed to you. Take any new Medicines after you have completely understood and accpet all the possible adverse reactions/side effects.   Please note  You were cared for by a hospitalist during your hospital stay. If you have any questions about your discharge medications or the care you received while you were in the hospital after you are discharged, you can call the unit and asked to speak with the hospitalist on call if the hospitalist that took care of you is not available. Once you are discharged, your primary care physician will handle any further medical issues. Please note that NO REFILLS for any discharge medications will be authorized once you are discharged, as it is imperative that you return to your primary care  physician (or establish a relationship with a primary care physician if you do not have one) for your aftercare needs so that they can reassess your need for medications and monitor your lab values.    On the day of Discharge:  VITAL SIGNS:  Blood pressure (!) 154/68, pulse 82, temperature 97.9 F (36.6 C), temperature source Oral, resp. rate 18, height 5\' 3"  (1.6 m), weight 75.6 kg (166 lb 9.6 oz), SpO2 97 %. PHYSICAL EXAMINATION:  GENERAL:  83 y.o.-year-old patient lying in the bed with no acute distress.  EYES: Pupils equal, round, reactive to light and accommodation. No scleral icterus. Extraocular muscles intact.  HEENT: Head atraumatic, normocephalic. Oropharynx and nasopharynx clear.  NECK:  Supple, no jugular venous distention. No thyroid enlargement, no tenderness.  LUNGS: Normal breath sounds bilaterally, no wheezing, rales,rhonchi or crepitation. No use of accessory muscles of respiration.  CARDIOVASCULAR: S1, S2 normal. No murmurs, rubs, or gallops.  ABDOMEN: Soft, non-tender, non-distended. Bowel sounds present. No organomegaly or mass.  EXTREMITIES: No pedal edema, cyanosis, or clubbing.  NEUROLOGIC: Cranial nerves II through XII are intact. Muscle strength 5/5 in all extremities. Sensation intact. Gait not checked.  PSYCHIATRIC: The patient is alert and oriented x 3.  SKIN: No obvious rash, lesion, or ulcer. TLSO brace in place DATA REVIEW:   CBC  Recent Labs Lab 03/04/16 0412  WBC 8.4  HGB 9.1*  HCT 27.5*  PLT 228    Chemistries   Recent Labs Lab 03/03/16 1958 03/04/16 0412  NA 137 136  K 4.3 4.1  CL 101 103  CO2 29 28  GLUCOSE 127* 132*  BUN 31* 23*  CREATININE 0.69 0.57  CALCIUM 9.4 8.6*  AST 26  --   ALT 17  --   ALKPHOS 65  --   BILITOT 0.4  --       Contact information for follow-up providers    Lucy Chris, MD. Schedule an appointment as soon as possible for a visit in 2 week(s).   Specialty:  Neurosurgery Contact information: [address] [place] [address] [phone]        Curtis Sites III, MD. Schedule an appointment as soon as possible for a visit in 1 week(s).   Specialty:  Internal Medicine Contact information: [address] [facility] [place] [address] [phone]            Contact information for after-discharge care    Destination    [facility] Follow up.   Specialties:  Skilled Holiday representative, Assisted Living Facility Contact information: [address] [place] [address] [phone]                  Management plans discussed with the patient, family and they are in agreement.  CODE STATUS: DNR  TOTAL TIME TAKING CARE OF THIS PATIENT: 45 minutes.    Delfino Lovett M.D on 03/06/2016 at 10:42 AM  Between 7am to 6pm - Pager - [phone]  After 6pm go to [email or web address] - Social research officer, government  [facility] [place] [facility]  Office  [phone]  CC: Primary care physician; Lynnea Ferrier, MD   Note: This dictation was prepared with Dragon dictation along with smaller phrase technology. Any transcriptional errors that result from this process are unintentional.

## 2016-03-06 NOTE — Progress Notes (Signed)
Patient is medically stable for D/C to [facility] today. Per Sue Lush admissions coordinator at [facility] patient will go to room 307-B. RN will call report at [phone] and arrange EMS for transport. Clinical Child psychotherapist (CSW) sent D/C orders to [facility] via [employer]. Patient is aware of above and agreeable to going into a semi-private room. CSW contacted patient's son Onalee Hua and made him aware of above. CSW also left a voicemail for patient's son Brett Canales making him aware of above. Please reconsult if future social work needs arise. CSW signing off.   [employer], LCSW [phone]

## 2016-03-07 DIAGNOSIS — S22069A Unspecified fracture of T7-T8 vertebra, initial encounter for closed fracture: Secondary | ICD-10-CM | POA: Diagnosis not present

## 2016-03-07 DIAGNOSIS — I5032 Chronic diastolic (congestive) heart failure: Secondary | ICD-10-CM

## 2016-03-07 DIAGNOSIS — S4380XA Sprain of other specified parts of unspecified shoulder girdle, initial encounter: Secondary | ICD-10-CM | POA: Diagnosis not present

## 2016-03-07 DIAGNOSIS — I481 Persistent atrial fibrillation: Secondary | ICD-10-CM

## 2016-03-07 DIAGNOSIS — F39 Unspecified mood [affective] disorder: Secondary | ICD-10-CM

## 2016-03-09 ENCOUNTER — Telehealth: Payer: Self-pay | Admitting: Family Medicine

## 2016-03-09 ENCOUNTER — Inpatient Hospital Stay
Admission: EM | Admit: 2016-03-09 | Discharge: 2016-03-18 | DRG: 389 | Disposition: A | Discharge status: Skilled Nursing Facility | Payer: Medicare Other | Attending: Surgery | Admitting: Surgery

## 2016-03-09 ENCOUNTER — Inpatient Hospital Stay: Payer: Medicare Other

## 2016-03-09 ENCOUNTER — Encounter: Payer: Self-pay | Admitting: *Deleted

## 2016-03-09 ENCOUNTER — Emergency Department: Payer: Medicare Other

## 2016-03-09 DIAGNOSIS — Z7982 Long term (current) use of aspirin: Secondary | ICD-10-CM | POA: Diagnosis not present

## 2016-03-09 DIAGNOSIS — N179 Acute kidney failure, unspecified: Secondary | ICD-10-CM | POA: Diagnosis present

## 2016-03-09 DIAGNOSIS — E860 Dehydration: Secondary | ICD-10-CM | POA: Diagnosis present

## 2016-03-09 DIAGNOSIS — G2581 Restless legs syndrome: Secondary | ICD-10-CM | POA: Diagnosis present

## 2016-03-09 DIAGNOSIS — K219 Gastro-esophageal reflux disease without esophagitis: Secondary | ICD-10-CM | POA: Diagnosis present

## 2016-03-09 DIAGNOSIS — M1990 Unspecified osteoarthritis, unspecified site: Secondary | ICD-10-CM | POA: Diagnosis present

## 2016-03-09 DIAGNOSIS — K567 Ileus, unspecified: Secondary | ICD-10-CM

## 2016-03-09 DIAGNOSIS — R059 Cough, unspecified: Secondary | ICD-10-CM

## 2016-03-09 DIAGNOSIS — K5651 Intestinal adhesions [bands], with partial obstruction: Secondary | ICD-10-CM | POA: Diagnosis present

## 2016-03-09 DIAGNOSIS — Z90710 Acquired absence of both cervix and uterus: Secondary | ICD-10-CM

## 2016-03-09 DIAGNOSIS — R05 Cough: Secondary | ICD-10-CM

## 2016-03-09 DIAGNOSIS — Z7951 Long term (current) use of inhaled steroids: Secondary | ICD-10-CM | POA: Diagnosis not present

## 2016-03-09 DIAGNOSIS — Z8249 Family history of ischemic heart disease and other diseases of the circulatory system: Secondary | ICD-10-CM | POA: Diagnosis not present

## 2016-03-09 DIAGNOSIS — W19XXXA Unspecified fall, initial encounter: Secondary | ICD-10-CM

## 2016-03-09 DIAGNOSIS — Z833 Family history of diabetes mellitus: Secondary | ICD-10-CM

## 2016-03-09 DIAGNOSIS — K56609 Unspecified intestinal obstruction, unspecified as to partial versus complete obstruction: Secondary | ICD-10-CM

## 2016-03-09 DIAGNOSIS — K439 Ventral hernia without obstruction or gangrene: Secondary | ICD-10-CM | POA: Diagnosis present

## 2016-03-09 DIAGNOSIS — Z66 Do not resuscitate: Secondary | ICD-10-CM | POA: Diagnosis present

## 2016-03-09 DIAGNOSIS — N390 Urinary tract infection, site not specified: Secondary | ICD-10-CM | POA: Diagnosis present

## 2016-03-09 DIAGNOSIS — Z8711 Personal history of peptic ulcer disease: Secondary | ICD-10-CM

## 2016-03-09 DIAGNOSIS — J439 Emphysema, unspecified: Secondary | ICD-10-CM | POA: Diagnosis present

## 2016-03-09 DIAGNOSIS — Y92009 Unspecified place in unspecified non-institutional (private) residence as the place of occurrence of the external cause: Secondary | ICD-10-CM

## 2016-03-09 DIAGNOSIS — M6281 Muscle weakness (generalized): Secondary | ICD-10-CM

## 2016-03-09 DIAGNOSIS — K589 Irritable bowel syndrome without diarrhea: Secondary | ICD-10-CM | POA: Diagnosis present

## 2016-03-09 DIAGNOSIS — R262 Difficulty in walking, not elsewhere classified: Secondary | ICD-10-CM

## 2016-03-09 DIAGNOSIS — I429 Cardiomyopathy, unspecified: Secondary | ICD-10-CM | POA: Diagnosis present

## 2016-03-09 DIAGNOSIS — Z950 Presence of cardiac pacemaker: Secondary | ICD-10-CM | POA: Diagnosis not present

## 2016-03-09 DIAGNOSIS — I4891 Unspecified atrial fibrillation: Secondary | ICD-10-CM | POA: Diagnosis present

## 2016-03-09 DIAGNOSIS — I428 Other cardiomyopathies: Secondary | ICD-10-CM | POA: Diagnosis present

## 2016-03-09 DIAGNOSIS — Z888 Allergy status to other drugs, medicaments and biological substances status: Secondary | ICD-10-CM

## 2016-03-09 DIAGNOSIS — R112 Nausea with vomiting, unspecified: Secondary | ICD-10-CM | POA: Diagnosis present

## 2016-03-09 DIAGNOSIS — Z87891 Personal history of nicotine dependence: Secondary | ICD-10-CM | POA: Diagnosis not present

## 2016-03-09 DIAGNOSIS — Z79899 Other long term (current) drug therapy: Secondary | ICD-10-CM

## 2016-03-09 LAB — COMPREHENSIVE METABOLIC PANEL
ALK PHOS: 63 U/L (ref 38–126)
ALT: 23 U/L (ref 14–54)
AST: 32 U/L (ref 15–41)
Albumin: 3.5 g/dL (ref 3.5–5.0)
Anion gap: 11 (ref 5–15)
BILIRUBIN TOTAL: 0.7 mg/dL (ref 0.3–1.2)
BUN: 74 mg/dL — AB (ref 6–20)
CALCIUM: 8.9 mg/dL (ref 8.9–10.3)
CO2: 34 mmol/L — ABNORMAL HIGH (ref 22–32)
CREATININE: 1.62 mg/dL — AB (ref 0.44–1.00)
Chloride: 88 mmol/L — ABNORMAL LOW (ref 101–111)
GFR calc Af Amer: 32 mL/min — ABNORMAL LOW (ref >60)
GFR calc non Af Amer: 28 mL/min — ABNORMAL LOW (ref >60)
GLUCOSE: 107 mg/dL — AB (ref 65–99)
Potassium: 4.5 mmol/L (ref 3.5–5.1)
Sodium: 133 mmol/L — ABNORMAL LOW (ref 135–145)
TOTAL PROTEIN: 7.6 g/dL (ref 6.5–8.1)

## 2016-03-09 LAB — CBC
HEMATOCRIT: 32.2 % — AB (ref 35.0–47.0)
Hemoglobin: 10.8 g/dL — ABNORMAL LOW (ref 12.0–16.0)
MCH: 31.4 pg (ref 26.0–34.0)
MCHC: 33.5 g/dL (ref 32.0–36.0)
MCV: 93.7 fL (ref 80.0–100.0)
PLATELETS: 226 10*3/uL (ref 150–440)
RBC: 3.44 MIL/uL — ABNORMAL LOW (ref 3.80–5.20)
RDW: 16.9 % — AB (ref 11.5–14.5)
WBC: 11.7 10*3/uL — ABNORMAL HIGH (ref 3.6–11.0)

## 2016-03-09 LAB — LIPASE, BLOOD: LIPASE: 14 U/L (ref 11–51)

## 2016-03-09 MED ORDER — HYDROMORPHONE HCL 1 MG/ML IJ SOLN
0.5000 mg | INTRAMUSCULAR | Status: DC | PRN
  Administered 2016-03-09 – 2016-03-13 (×11): 0.5 mg via INTRAVENOUS
  Filled 2016-03-09 (×11): qty 0.5

## 2016-03-09 MED ORDER — DIGOXIN 0.25 MG/ML IJ SOLN
0.1250 mg | Freq: Every day | INTRAMUSCULAR | Status: DC
  Administered 2016-03-10 – 2016-03-14 (×5): 0.125 mg via INTRAVENOUS
  Filled 2016-03-09 (×6): qty 0.5

## 2016-03-09 MED ORDER — TIOTROPIUM BROMIDE MONOHYDRATE 18 MCG IN CAPS
18.0000 ug | ORAL_CAPSULE | Freq: Every day | RESPIRATORY_TRACT | Status: DC
  Filled 2016-03-09: qty 5

## 2016-03-09 MED ORDER — SODIUM CHLORIDE 0.9 % IV SOLN
INTRAVENOUS | Status: DC
  Administered 2016-03-09 – 2016-03-10 (×2): via INTRAVENOUS

## 2016-03-09 MED ORDER — ONDANSETRON 4 MG PO TBDP: 4.0000 mg | ORAL_TABLET | Freq: Four times a day (QID) | ORAL | Status: DC | PRN

## 2016-03-09 MED ORDER — PANTOPRAZOLE SODIUM 40 MG IV SOLR
40.0000 mg | Freq: Every day | INTRAVENOUS | Status: DC
  Administered 2016-03-09: 40 mg via INTRAVENOUS
  Filled 2016-03-09: qty 40

## 2016-03-09 MED ORDER — SODIUM CHLORIDE 0.9 % IV SOLN
Freq: Once | INTRAVENOUS | Status: AC
  Administered 2016-03-09: 17:00:00 via INTRAVENOUS

## 2016-03-09 MED ORDER — HYDRALAZINE HCL 20 MG/ML IJ SOLN: 10.0000 mg | Freq: Four times a day (QID) | INTRAMUSCULAR | Status: DC | PRN

## 2016-03-09 MED ORDER — LORAZEPAM 2 MG/ML IJ SOLN
0.5000 mg | Freq: Once | INTRAMUSCULAR | Status: AC
  Administered 2016-03-09: 0.5 mg via INTRAVENOUS
  Filled 2016-03-09: qty 1

## 2016-03-09 MED ORDER — MOMETASONE FURO-FORMOTEROL FUM 100-5 MCG/ACT IN AERO
2.0000 | INHALATION_SPRAY | Freq: Two times a day (BID) | RESPIRATORY_TRACT | Status: DC
  Administered 2016-03-09 – 2016-03-18 (×18): 2 via RESPIRATORY_TRACT
  Filled 2016-03-09: qty 8.8

## 2016-03-09 MED ORDER — ONDANSETRON HCL 4 MG/2ML IJ SOLN
4.0000 mg | Freq: Four times a day (QID) | INTRAMUSCULAR | Status: DC | PRN
  Administered 2016-03-14 (×2): 4 mg via INTRAVENOUS
  Filled 2016-03-09 (×2): qty 2

## 2016-03-09 MED ORDER — TIOTROPIUM BROMIDE MONOHYDRATE 18 MCG IN CAPS
18.0000 ug | ORAL_CAPSULE | Freq: Every day | RESPIRATORY_TRACT | Status: DC
  Administered 2016-03-10 – 2016-03-18 (×7): 18 ug via RESPIRATORY_TRACT
  Filled 2016-03-09 (×2): qty 5

## 2016-03-09 MED ORDER — ALBUTEROL SULFATE (2.5 MG/3ML) 0.083% IN NEBU: 2.5000 mg | INHALATION_SOLUTION | RESPIRATORY_TRACT | Status: DC | PRN

## 2016-03-09 MED ORDER — MORPHINE SULFATE (PF) 4 MG/ML IV SOLN
4.0000 mg | Freq: Once | INTRAVENOUS | Status: AC
  Administered 2016-03-09: 4 mg via INTRAVENOUS
  Filled 2016-03-09: qty 1

## 2016-03-09 MED ORDER — HEPARIN SODIUM (PORCINE) 5000 UNIT/ML IJ SOLN
5000.0000 [IU] | Freq: Three times a day (TID) | INTRAMUSCULAR | Status: DC
  Administered 2016-03-09 – 2016-03-13 (×11): 5000 [IU] via SUBCUTANEOUS
  Filled 2016-03-09 (×11): qty 1

## 2016-03-09 MED ORDER — ONDANSETRON HCL 4 MG/2ML IJ SOLN
4.0000 mg | Freq: Once | INTRAMUSCULAR | Status: AC
  Administered 2016-03-09: 4 mg via INTRAVENOUS
  Filled 2016-03-09: qty 2

## 2016-03-09 NOTE — ED Triage Notes (Signed)
Pt to ED via EMS from [facility] reporting a possible bowel obstruction. Pt has hx of the same. Abd is distended at this time with reported discomfort upon palpation. Pt had a BM today that is reported to have been watery in nature. No nausea or vomiting at this time. Pt alert and oriented x 4.   Pt in [place] at this time for a T7 compression fracture.

## 2016-03-09 NOTE — Telephone Encounter (Signed)
Call rec from [place] at [facility] Pt has very distended abdomen with no bs- and having pain /nausea  Suspect another bowel obst Will send to ED  I will cc this to pcp

## 2016-03-09 NOTE — ED Notes (Signed)
MD at bedside. 

## 2016-03-09 NOTE — ED Provider Notes (Signed)
[facility] Emergency Department Provider Note        Time seen: ----------------------------------------- 4:24 PM on 03/09/2016 -----------------------------------------    I have reviewed the triage vital signs and the nursing notes.   HISTORY  Chief Complaint Constipation    HPI [name] is a 83 y.o. female who presents to ER for possible bowel obstruction. She was sent from [facility] [place] due to abdominal pain and distention. She does appear chronic history of same. Abdomen is distended upon arrival. She has had some liquid stool which is not unusual for her when she's had a bowel obstruction in the past. She denies fevers, chills but has had nausea.Nothing makes her symptoms better.   Past Medical History:  Diagnosis Date  . AICD (automatic cardioverter/defibrillator) present   . Anemia   . Arthritis   . Asthma   . Atrial fibrillation (HCC)    elected not to take coumadin  . Bacteremia 2007   asystole pacemaker failed to capture due to lead wire  . Bowel obstruction    multiple  . Cardiomyopathy (HCC) 1990  . CHF (congestive heart failure) (HCC)   . COPD (chronic obstructive pulmonary disease) (HCC)   . Diverticulosis   . Emphysema of lung (HCC)   . GERD (gastroesophageal reflux disease)   . History of IBS   . Multiple gastric ulcers    in recent years  . Presence of permanent cardiac pacemaker   . Restless leg syndrome   . Shortness of breath dyspnea    with exertion  . 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 Ulcerative colitis (HCC)    2007    Patient Active Problem List   Diagnosis Date Noted  . Thoracic compression fracture, closed, initial encounter (HCC) 03/03/2016  . SBO (small bowel obstruction) 01/11/2016  . Small bowel obstruction 10/11/2014  . 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 ABDOMINAL HYSTERECTOMY  1990  . APPENDECTOMY   1990  . CARDIAC CATHETERIZATION    . CHOLECYSTECTOMY    . COLON SURGERY    . COLOSTOMY Left 1992  . COLOSTOMY TAKEDOWN  1993  . EYE SURGERY Bilateral    Cataract Extraction with IOL  . IMPLANTABLE CARDIOVERTER DEFIBRILLATOR (ICD) GENERATOR CHANGE N/A 05/26/2015   Procedure: ICD GENERATOR CHANGE;  Surgeon: Sharion Settler, MD;  Location: [facility] ORS;  Service: Cardiovascular;  Laterality: N/A;  . PACEMAKER INSERTION  2003  . VENTRICULAR CARDIAC PACEMAKER INSERTION  2007   biventricular pacer with defib implanted 2007 concerto [id number] serial [id number]    Allergies Toprol xl [metoprolol]; Ace inhibitors; and Diltiazem hcl  Social History Social History  Substance Use Topics  . Smoking status: Former Smoker    Packs/day: 1.00    Types: Cigarettes    Quit date: 03/28/2005  . Smokeless tobacco: Never Used  . Alcohol use Yes     Comment: occ    Review of Systems Constitutional: Negative for fever. Cardiovascular: Negative for chest pain. Respiratory: Negative for shortness of breath. Gastrointestinal: Positive for abdominal pain, vomiting Genitourinary: Negative for dysuria. Musculoskeletal: Negative for back pain. Skin: Negative for rash. Neurological: Negative for headaches, focal weakness or numbness.  10-point ROS otherwise negative.  ____________________________________________   PHYSICAL EXAM:  VITAL SIGNS: ED Triage Vitals  Enc Vitals Group     BP 03/09/16 1611 111/76     Pulse Rate 03/09/16 1611 97  Resp 03/09/16 1611 16     Temp 03/09/16 1611 98.2 F (36.8 C)     Temp Source 03/09/16 1611 Oral     SpO2 03/09/16 1611 94 %     Weight 03/09/16 1612 166 lb (75.3 kg)     Height 03/09/16 1612 5\' 3"  (1.6 m)     Head Circumference --      Peak Flow --      Pain Score 03/09/16 1612 5     Pain Loc --      Pain Edu? --      Excl. in GC? --     Constitutional: Alert and oriented. Mild distress Eyes: Conjunctivae are normal. PERRL. Normal extraocular  movements. ENT   Head: Normocephalic and atraumatic.   Nose: No congestion/rhinnorhea.   Mouth/Throat: Mucous membranes are moist.   Neck: No stridor. Cardiovascular: Normal rate, regular rhythm. No murmurs, rubs, or gallops. Respiratory: Normal respiratory effort without tachypnea nor retractions. Breath sounds are clear and equal bilaterally. No wheezes/rales/rhonchi. Gastrointestinal: Marked abdominal distention, reducible ventral hernia. Normal bowel sounds. Musculoskeletal: Nontender with normal range of motion in all extremities. No lower extremity tenderness nor edema. Neurologic:  Normal speech and language. No gross focal neurologic deficits are appreciated.  Skin:  Skin is warm, dry and intact. No rash noted. Psychiatric: Mood and affect are normal. Speech and behavior are normal.  ___________________________________________  ED COURSE:  Pertinent labs & imaging results that were available during my care of the patient were reviewed by me and considered in my medical decision making (see chart for details). Clinical Course   Patient is in no distress, we will assess with labs imaging. Likely bowel obstruction. She will likely need NG tube placement.  Procedures ____________________________________________   LABS (pertinent positives/negatives)  Labs Reviewed  COMPREHENSIVE METABOLIC PANEL - Abnormal; Notable for the following:       Result Value   Sodium 133 (*)    Chloride 88 (*)    CO2 34 (*)    Glucose, Bld 107 (*)    BUN 74 (*)    Creatinine, Ser 1.62 (*)    GFR calc non Af Amer 28 (*)    GFR calc Af Amer 32 (*)    All other components within normal limits  CBC - Abnormal; Notable for the following:    WBC 11.7 (*)    RBC 3.44 (*)    Hemoglobin 10.8 (*)    HCT 32.2 (*)    RDW 16.9 (*)    All other components within normal limits  LIPASE, BLOOD  URINALYSIS, COMPLETE (UACMP) WITH MICROSCOPIC    RADIOLOGY Images were viewed by me  Abdomen 2  view IMPRESSION: Distended small bowel loops in the abdomen out of proportion to the degree of gas and stool within the colon question small bowel obstruction. ____________________________________________  FINAL ASSESSMENT AND PLAN  Small bowel obstruction  Plan: Patient with labs and imaging as dictated above. Patient presents to the ER with a history of abdominal pain and vomiting. She does have a history of recurrent bowel obstruction. We will place an NG tube, consult general surgery for evaluation.   Emily Filbert, MD   Note: This dictation was prepared with Dragon dictation. Any transcriptional errors that result from this process are unintentional    Emily Filbert, MD 03/09/16 [phone]

## 2016-03-09 NOTE — ED Notes (Signed)
pts oxygen adjusted to 4L. Pt sleeping after administration of ativan. Stats around low 90s but dropping into the high 80s regularly.

## 2016-03-09 NOTE — H&P (Signed)
Date of Admission:  03/09/2016  Reason for Admission:  Small bowel obstruction  History of Present Illness: [name] is a 83 y.o. female known to the surgical service with a history of prior small bowel obstruction episodes. She presents today with episodes of nausea and vomiting and abdominal distention with pain. This is similar to prior presentations. Of note the patient's son reports that this week she had gastroenteritis with significant diarrhea and was starting to get better over the last 2 days and then today started developing nausea vomiting and no further bowel movements with worsening abdominal distention. She did have a very small liquid bowel movement this morning but otherwise continues feeling distended. Reports discomfort and right and left sides of her abdomen. Denies any fevers, chills, chest pain, shortness of breath.  Of note patient was recently admitted on 1/7 with a compression fracture and had been discharged to a rehabilitation facility earlier this week.  Past Medical History: Past Medical History:  Diagnosis Date  . AICD (automatic cardioverter/defibrillator) present   . Anemia   . Arthritis   . Asthma   . Atrial fibrillation (HCC)    elected not to take coumadin  . Bacteremia 2007   asystole pacemaker failed to capture due to lead wire  . Bowel obstruction    multiple  . Cardiomyopathy (HCC) 1990  . CHF (congestive heart failure) (HCC)   . COPD (chronic obstructive pulmonary disease) (HCC)   . Diverticulosis   . Emphysema of lung (HCC)   . GERD (gastroesophageal reflux disease)   . History of IBS   . Multiple gastric ulcers    in recent years  . Presence of permanent cardiac pacemaker   . Restless leg syndrome   . Shortness of breath dyspnea    with exertion  . 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 Ulcerative colitis (HCC)    2007     Past Surgical History: Past Surgical History:  Procedure Laterality Date  . ABDOMINAL  HYSTERECTOMY  1990  . APPENDECTOMY  1990  . CARDIAC CATHETERIZATION    . CHOLECYSTECTOMY    . COLON SURGERY    . COLOSTOMY Left 1992  . COLOSTOMY TAKEDOWN  1993  . EYE SURGERY Bilateral    Cataract Extraction with IOL  . IMPLANTABLE CARDIOVERTER DEFIBRILLATOR (ICD) GENERATOR CHANGE N/A 05/26/2015   Procedure: ICD GENERATOR CHANGE;  Surgeon: Sharion Settler, MD;  Location: [facility] ORS;  Service: Cardiovascular;  Laterality: N/A;  . PACEMAKER INSERTION  2003  . VENTRICULAR CARDIAC PACEMAKER INSERTION  2007   biventricular pacer with defib implanted 2007 concerto [id number] serial [id number]    Home Medications: Prior to Admission medications   Medication Sig Start Date End Date Taking? Authorizing Provider  albuterol (PROVENTIL HFA;VENTOLIN HFA) 108 (90 BASE) MCG/ACT inhaler Inhale 2 puffs into the lungs 2 (two) times daily.   Yes Historical Provider, MD  amitriptyline (ELAVIL) 75 MG tablet Take 75 mg by mouth at bedtime.   Yes Historical Provider, MD  aspirin 81 MG chewable tablet Chew 81 mg by mouth daily.   Yes Historical Provider, MD  celecoxib (CELEBREX) 200 MG capsule Take 200 mg by mouth 2 (two) times daily.    Yes Historical Provider, MD  cyclobenzaprine (FLEXERIL) 5 MG tablet Take 1 tablet (5 mg total) by mouth 3 (three) times daily as needed for muscle spasms. 03/06/16  Yes Delfino Lovett, MD  digoxin (LANOXIN) 0.125 MG tablet Take 0.125 mg by mouth daily.  Yes Historical Provider, MD  Fluticasone-Salmeterol (ADVAIR) 100-50 MCG/DOSE AEPB Inhale 1 puff into the lungs 2 (two) times daily.   Yes Historical Provider, MD  furosemide (LASIX) 20 MG tablet Take 60 mg by mouth daily.    Yes Historical Provider, MD  ibuprofen (ADVIL,MOTRIN) 400 MG tablet Take 1 tablet (400 mg total) by mouth every 6 (six) hours as needed. 03/06/16  Yes Vipul Sherryll Burger, MD  losartan (COZAAR) 25 MG tablet Take 25 mg by mouth daily.   Yes Historical Provider, MD  omeprazole (PRILOSEC) 20 MG capsule Take 20 mg by mouth daily.     Yes Historical Provider, MD  oxyCODONE (OXY IR/ROXICODONE) 5 MG immediate release tablet Take 1 tablet (5 mg total) by mouth every 12 (twelve) hours as needed for moderate pain or severe pain. 03/06/16  Yes Vipul Sherryll Burger, MD  pantoprazole (PROTONIX) 20 MG tablet Take 20 mg by mouth at bedtime.   Yes Historical Provider, MD  polyethylene glycol (MIRALAX / GLYCOLAX) packet Take 17 g by mouth daily.   Yes Historical Provider, MD  senna-docusate (SENOKOT-S) 8.6-50 MG tablet Take 2 tablets by mouth at bedtime as needed for mild constipation. 03/06/16  Yes Vipul Sherryll Burger, MD  tiotropium (SPIRIVA) 18 MCG inhalation capsule Place 18 mcg into inhaler and inhale daily.   Yes Historical Provider, MD    Allergies: Allergies  Allergen Reactions  . Toprol Xl [Metoprolol] Other (See Comments)    Reaction: skin pulls away from the bottom of feet  . Ace Inhibitors Cough  . Diltiazem Hcl Other (See Comments)    Social History:  reports that she quit smoking about 10 years ago. Her smoking use included Cigarettes. She smoked 1.00 pack per day. She has never used smokeless tobacco. She reports that she drinks alcohol. She reports that she does not use drugs.   Family History: Family History  Problem Relation Age of Onset  . Cancer Mother     Uterine  . Diabetes Father   . Heart disease Father     Review of Systems: Review of Systems  Constitutional: Negative for chills and fever.  HENT: Negative for hearing loss.   Eyes: Negative for blurred vision.  Respiratory: Negative for cough and shortness of breath.   Cardiovascular: Negative for chest pain and leg swelling.  Gastrointestinal: Positive for abdominal pain, diarrhea, nausea and vomiting. Negative for heartburn.  Genitourinary: Negative for dysuria and hematuria.  Musculoskeletal: Negative for myalgias.  Skin: Negative for rash.  Neurological: Negative for dizziness.  Psychiatric/Behavioral: Negative for depression.  All other systems reviewed and  are negative.   Physical Exam BP 116/69   Pulse 87   Temp 98.2 F (36.8 C) (Oral)   Resp (!) 24   Ht 5\' 3"  (1.6 m)   Wt 75.3 kg (166 lb)   SpO2 90%   BMI 29.41 kg/m  CONSTITUTIONAL: No acute distress HEENT:  Normocephalic, atraumatic, extraocular motion intact. NECK: Trachea is midline, and there is no jugular venous distension.  RESPIRATORY:  Lungs are clear, and breath sounds are equal bilaterally. Normal respiratory effort without pathologic use of accessory muscles. CARDIOVASCULAR: Heart is regular without murmurs. She has a pacemaker and defibrillator in place. GI: The abdomen is soft, distended, with mild discomfort to palpation over bilateral sides. There are no peritoneal signs. Patient has a ventral hernia in the lower abdomen which is reducible.. There were no palpable masses.  MUSCULOSKELETAL:  Normal muscle strength and tone in all four extremities.  No peripheral edema or cyanosis.  SKIN: Skin turgor is normal. There are no pathologic skin lesions.  NEUROLOGIC:  Motor and sensation is grossly normal.  Cranial nerves are grossly intact. PSYCH:  Alert and oriented to person, place and time. Affect is normal.  Laboratory Analysis: Results for orders placed or performed during the hospital encounter of 03/09/16 (from the past 24 hour(s))  Lipase, blood     Status: None   Collection Time: 03/09/16  4:17 PM  Result Value Ref Range   Lipase 14 11 - 51 U/L  Comprehensive metabolic panel     Status: Abnormal   Collection Time: 03/09/16  4:17 PM  Result Value Ref Range   Sodium 133 (L) 135 - 145 mmol/L   Potassium 4.5 3.5 - 5.1 mmol/L   Chloride 88 (L) 101 - 111 mmol/L   CO2 34 (H) 22 - 32 mmol/L   Glucose, Bld 107 (H) 65 - 99 mg/dL   BUN 74 (H) 6 - 20 mg/dL   Creatinine, Ser 1.61 (H) 0.44 - 1.00 mg/dL   Calcium 8.9 8.9 - 09.6 mg/dL   Total Protein 7.6 6.5 - 8.1 g/dL   Albumin 3.5 3.5 - 5.0 g/dL   AST 32 15 - 41 U/L   ALT 23 14 - 54 U/L   Alkaline Phosphatase 63 38 -  126 U/L   Total Bilirubin 0.7 0.3 - 1.2 mg/dL   GFR calc non Af Amer 28 (L) >60 mL/min   GFR calc Af Amer 32 (L) >60 mL/min   Anion gap 11 5 - 15  CBC     Status: Abnormal   Collection Time: 03/09/16  4:17 PM  Result Value Ref Range   WBC 11.7 (H) 3.6 - 11.0 K/uL   RBC 3.44 (L) 3.80 - 5.20 MIL/uL   Hemoglobin 10.8 (L) 12.0 - 16.0 g/dL   HCT 04.5 (L) 40.9 - 81.1 %   MCV 93.7 80.0 - 100.0 fL   MCH 31.4 26.0 - 34.0 pg   MCHC 33.5 32.0 - 36.0 g/dL   RDW 91.4 (H) 78.2 - 95.6 %   Platelets 226 150 - 440 K/uL    Imaging: Dg Abd 2 Views  Result Date: 03/09/2016 CLINICAL DATA:  Abdominal distension and discomfort question bowel obstruction EXAM: ABDOMEN - 2 VIEW COMPARISON:  CT abdomen pelvis 03/04/2011 FINDINGS: Bibasilar atelectasis. Pacemaker/AICD leads project over heart. Air-filled dilated loops of small bowel, out of proportion to the small amount gas and stool present in the colon. Findings concerning for small bowel obstruction. No definite bowel wall thickening or free air. Marked osseous demineralization. IMPRESSION: Distended small bowel loops in the abdomen out of proportion to the degree of gas and stool within the colon question small bowel obstruction. Electronically Signed   By: Ulyses Southward M.D.   On: 03/09/2016 17:13    Assessment and Plan: This is a 83 y.o. female who presents with Recurrent small bowel obstruction episode. Of note she recently had an episode of gastroenteritis with significant diarrhea that she is just recovered from 2 days ago, and now with small bowel obstruction symptoms. I personally reviewed the patient's laboratory studies and imaging studies. The patient appears to be dehydrated with the elevated creatinine and CO2 and on x-ray several dilated loops of small bowel like prior imaging studies.  She will be admitted to the general surgery service. NG tube will be placed to suction. She will have gentle IV fluid hydration given her CHF. She'll be nothing by  mouth for now while waiting  for return of bowel function. Her inhalers will be resumed and will convert some of her medications to IV version. The patient and her son are aware that for now we'll continue with conservative measures given her significant comorbidities but that there is always a possibility that if there is no improvement in her symptoms that she may require surgery for decompression.   Howie Ill, MD [facility]

## 2016-03-10 ENCOUNTER — Inpatient Hospital Stay: Payer: Medicare Other

## 2016-03-10 LAB — MAGNESIUM: Magnesium: 2.7 mg/dL — ABNORMAL HIGH (ref 1.7–2.4)

## 2016-03-10 LAB — CBC WITH DIFFERENTIAL/PLATELET
Basophils Absolute: 0 10*3/uL (ref 0–0.1)
Basophils Relative: 0 %
Eosinophils Absolute: 0 10*3/uL (ref 0–0.7)
Eosinophils Relative: 0 %
HEMATOCRIT: 32.7 % — AB (ref 35.0–47.0)
HEMOGLOBIN: 10.9 g/dL — AB (ref 12.0–16.0)
LYMPHS ABS: 0.6 10*3/uL — AB (ref 1.0–3.6)
LYMPHS PCT: 9 %
MCH: 31.5 pg (ref 26.0–34.0)
MCHC: 33.5 g/dL (ref 32.0–36.0)
MCV: 94.1 fL (ref 80.0–100.0)
MONO ABS: 0.9 10*3/uL (ref 0.2–0.9)
MONOS PCT: 13 %
NEUTROS ABS: 5.6 10*3/uL (ref 1.4–6.5)
Neutrophils Relative %: 78 %
Platelets: 227 10*3/uL (ref 150–440)
RBC: 3.47 MIL/uL — ABNORMAL LOW (ref 3.80–5.20)
RDW: 16.5 % — AB (ref 11.5–14.5)
WBC: 7.2 10*3/uL (ref 3.6–11.0)

## 2016-03-10 LAB — URINALYSIS, COMPLETE (UACMP) WITH MICROSCOPIC
Bilirubin Urine: NEGATIVE
GLUCOSE, UA: NEGATIVE mg/dL
HGB URINE DIPSTICK: NEGATIVE
Ketones, ur: NEGATIVE mg/dL
Leukocytes, UA: NEGATIVE
NITRITE: NEGATIVE
PH: 5 (ref 5.0–8.0)
PROTEIN: NEGATIVE mg/dL
Specific Gravity, Urine: 1.015 (ref 1.005–1.030)

## 2016-03-10 LAB — BASIC METABOLIC PANEL
Anion gap: 11 (ref 5–15)
BUN: 82 mg/dL — AB (ref 6–20)
CHLORIDE: 89 mmol/L — AB (ref 101–111)
CO2: 32 mmol/L (ref 22–32)
Calcium: 8.1 mg/dL — ABNORMAL LOW (ref 8.9–10.3)
Creatinine, Ser: 1.87 mg/dL — ABNORMAL HIGH (ref 0.44–1.00)
GFR calc Af Amer: 27 mL/min — ABNORMAL LOW (ref >60)
GFR, EST NON AFRICAN AMERICAN: 23 mL/min — AB (ref >60)
GLUCOSE: 134 mg/dL — AB (ref 65–99)
POTASSIUM: 4.5 mmol/L (ref 3.5–5.1)
Sodium: 132 mmol/L — ABNORMAL LOW (ref 135–145)

## 2016-03-10 MED ORDER — ORAL CARE MOUTH RINSE
15.0000 mL | Freq: Two times a day (BID) | OROMUCOSAL | Status: DC
  Administered 2016-03-10 – 2016-03-18 (×14): 15 mL via OROMUCOSAL

## 2016-03-10 MED ORDER — DEXTROSE IN LACTATED RINGERS 5 % IV SOLN
INTRAVENOUS | Status: DC
  Administered 2016-03-10 (×2): via INTRAVENOUS

## 2016-03-10 MED ORDER — PANTOPRAZOLE SODIUM 40 MG IV SOLR
40.0000 mg | Freq: Two times a day (BID) | INTRAVENOUS | Status: DC
  Administered 2016-03-10 – 2016-03-13 (×6): 40 mg via INTRAVENOUS
  Filled 2016-03-10 (×6): qty 40

## 2016-03-10 MED ORDER — ALBUMIN HUMAN 25 % IV SOLN
12.5000 g | Freq: Once | INTRAVENOUS | Status: AC
  Administered 2016-03-10: 12.5 g via INTRAVENOUS
  Filled 2016-03-10: qty 50

## 2016-03-10 NOTE — Consult Note (Addendum)
[facility] - [place] at [facility]   PATIENT NAME: [name]    MR#:  [id number]  DATE OF BIRTH:  Oct 23, 1927  DATE OF ADMISSION:  03/09/2016  PRIMARY CARE PHYSICIAN: Curtis Sites III, MD   REQUESTING/REFERRING PHYSICIAN: Dr. Sterling Big   CHIEF COMPLAINT:   Chief Complaint  Patient presents with  . Constipation    HISTORY OF PRESENT ILLNESS:  [name]  is a 83 y.o. female with a known history of Nonischemic cardiomyopathy status post defibrillated, history of complete heart block status post pacemaker, COPD, degenerative disc disease, history of bowel obstructions, GERD, depression was independent from home presents to hospital secondary to abdominal pain and nausea vomiting.  Patient has had several admissions for bowel obstruction in the past, the last one being in December 2017. Due to high risk for surgery, she is usually treated conservatively, improves and goes home. No trigger has been identified prior to these obstructions. She was just in the hospital last week for a fall and T7 compression fracture and discharged to [facility] rehabilitation. She has been on pain medications as needed. She was doing fine up until yesterday she started having abdominal distention, nausea and vomiting and some loose stools. Abdominal imaging studies show bowel obstruction, NG tube was placed and almost 500 cc of coffee ground material has been suctioned. Still has abdominal pain, nausea is improving. Admitted to surgical service and is being managed conservatively. Medical consult requested for medical management. Patient denies any chest pain. Due to her cardiac history, will monitor on telemetry. Labs show renal dysfunction.  PAST MEDICAL HISTORY:   Past Medical History:  Diagnosis Date  . AICD (automatic cardioverter/defibrillator) present   . Anemia   . Arthritis   . Asthma   . Atrial fibrillation (HCC)    elected not to take coumadin  . Bacteremia 2007    asystole pacemaker failed to capture due to lead wire  . Bowel obstruction    multiple  . Cardiomyopathy (HCC) 1990  . CHF (congestive heart failure) (HCC)   . COPD (chronic obstructive pulmonary disease) (HCC)   . Diverticulosis   . Emphysema of lung (HCC)   . GERD (gastroesophageal reflux disease)   . History of IBS   . Multiple gastric ulcers    in recent years  . Presence of permanent cardiac pacemaker   . Restless leg syndrome   . Shortness of breath dyspnea    with exertion  . 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 Ulcerative colitis (HCC)    2007    PAST SURGICAL HISTOIRY:   Past Surgical History:  Procedure Laterality Date  . ABDOMINAL HYSTERECTOMY  1990  . APPENDECTOMY  1990  . CARDIAC CATHETERIZATION    . CHOLECYSTECTOMY    . COLON SURGERY    . COLOSTOMY Left 1992  . COLOSTOMY TAKEDOWN  1993  . EYE SURGERY Bilateral    Cataract Extraction with IOL  . IMPLANTABLE CARDIOVERTER DEFIBRILLATOR (ICD) GENERATOR CHANGE N/A 05/26/2015   Procedure: ICD GENERATOR CHANGE;  Surgeon: Sharion Settler, MD;  Location: [facility] ORS;  Service: Cardiovascular;  Laterality: N/A;  . PACEMAKER INSERTION  2003  . VENTRICULAR CARDIAC PACEMAKER INSERTION  2007   biventricular pacer with defib implanted 2007 concerto [id number] serial [id number]    SOCIAL HISTORY:   Social History  Substance Use Topics  . Smoking status: Former Smoker    Packs/day: 1.00    Types: Cigarettes  Quit date: 03/28/2005  . Smokeless tobacco: Never Used  . Alcohol use Yes     Comment: occ    FAMILY HISTORY:   Family History  Problem Relation Age of Onset  . Cancer Mother     Uterine  . Diabetes Father   . Heart disease Father     DRUG ALLERGIES:   Allergies  Allergen Reactions  . Toprol Xl [Metoprolol] Other (See Comments)    Reaction: skin pulls away from the bottom of feet  . Ace Inhibitors Cough  . Diltiazem Hcl Other (See Comments)    REVIEW OF SYSTEMS:   Review of  Systems  Constitutional: Positive for malaise/fatigue. Negative for chills, fever and weight loss.  HENT: Positive for hearing loss. Negative for ear discharge, ear pain and nosebleeds.   Eyes: Negative for blurred vision, double vision and photophobia.  Respiratory: Negative for cough, hemoptysis, shortness of breath and wheezing.   Cardiovascular: Negative for chest pain, palpitations, orthopnea and leg swelling.  Gastrointestinal: Positive for abdominal pain and nausea. Negative for constipation, diarrhea, heartburn, melena and vomiting.  Genitourinary: Positive for dysuria. Negative for urgency.  Musculoskeletal: Positive for myalgias. Negative for back pain and neck pain.  Skin: Negative for rash.  Neurological: Negative for dizziness, tingling, sensory change, speech change, focal weakness and headaches.  Endo/Heme/Allergies: Does not bruise/bleed easily.  Psychiatric/Behavioral: Negative for depression.       Some confusion noted    MEDICATIONS AT HOME:   Prior to Admission medications   Medication Sig Start Date End Date Taking? Authorizing Provider  albuterol (PROVENTIL HFA;VENTOLIN HFA) 108 (90 BASE) MCG/ACT inhaler Inhale 2 puffs into the lungs 2 (two) times daily.   Yes Historical Provider, MD  amitriptyline (ELAVIL) 75 MG tablet Take 75 mg by mouth at bedtime.   Yes Historical Provider, MD  aspirin 81 MG chewable tablet Chew 81 mg by mouth daily.   Yes Historical Provider, MD  celecoxib (CELEBREX) 200 MG capsule Take 200 mg by mouth 2 (two) times daily.    Yes Historical Provider, MD  cyclobenzaprine (FLEXERIL) 5 MG tablet Take 1 tablet (5 mg total) by mouth 3 (three) times daily as needed for muscle spasms. 03/06/16  Yes Delfino Lovett, MD  digoxin (LANOXIN) 0.125 MG tablet Take 0.125 mg by mouth daily.   Yes Historical Provider, MD  Fluticasone-Salmeterol (ADVAIR) 100-50 MCG/DOSE AEPB Inhale 1 puff into the lungs 2 (two) times daily.   Yes Historical Provider, MD  furosemide  (LASIX) 20 MG tablet Take 60 mg by mouth daily.    Yes Historical Provider, MD  ibuprofen (ADVIL,MOTRIN) 400 MG tablet Take 1 tablet (400 mg total) by mouth every 6 (six) hours as needed. 03/06/16  Yes Vipul Sherryll Burger, MD  losartan (COZAAR) 25 MG tablet Take 25 mg by mouth daily.   Yes Historical Provider, MD  omeprazole (PRILOSEC) 20 MG capsule Take 20 mg by mouth daily.    Yes Historical Provider, MD  oxyCODONE (OXY IR/ROXICODONE) 5 MG immediate release tablet Take 1 tablet (5 mg total) by mouth every 12 (twelve) hours as needed for moderate pain or severe pain. 03/06/16  Yes Vipul Sherryll Burger, MD  pantoprazole (PROTONIX) 20 MG tablet Take 20 mg by mouth at bedtime.   Yes Historical Provider, MD  polyethylene glycol (MIRALAX / GLYCOLAX) packet Take 17 g by mouth daily.   Yes Historical Provider, MD  senna-docusate (SENOKOT-S) 8.6-50 MG tablet Take 2 tablets by mouth at bedtime as needed for mild constipation. 03/06/16  Yes Vipul  Sherryll Burger, MD  tiotropium (SPIRIVA) 18 MCG inhalation capsule Place 18 mcg into inhaler and inhale daily.   Yes Historical Provider, MD      VITAL SIGNS:  Blood pressure 106/62, pulse 98, temperature 97.5 F (36.4 C), temperature source Oral, resp. rate 17, height 5\' 3"  (1.6 m), weight 75.3 kg (166 lb), SpO2 90 %.  PHYSICAL EXAMINATION:   Physical Exam  GENERAL:  83 y.o.-year-old patient lying in the bed with no acute distress. Appears very tired. EYES: Pupils equal, round, reactive to light and accommodation. No scleral icterus. Extraocular muscles intact.  HEENT: Head atraumatic, normocephalic. Oropharynx and nasopharynx clear.  NECK:  Supple, no jugular venous distention. No thyroid enlargement, no tenderness.  LUNGS: Normal breath sounds bilaterally, no wheezing, rales,rhonchi or crepitation. No use of accessory muscles of respiration. Decreased bibasilar breath sounds CARDIOVASCULAR: S1, S2 normal. No rubs, or gallops. 3/6 systolic murmur present. ABDOMEN: distended, ventral  hernias present, diffuse tenderness all over, hypoactive Bowel sounds. No organomegaly or mass.  EXTREMITIES: No pedal edema, cyanosis, or clubbing.  NEUROLOGIC: Cranial nerves II through XII are intact. Muscle strength 5/5 in all extremities. Sensation intact. Gait not checked. Global weakness noted. PSYCHIATRIC: The patient is alert and oriented x 3. Minimal confusion noted. SKIN: No obvious rash, lesion, or ulcer.   LABORATORY PANEL:   CBC  Recent Labs Lab 03/10/16 0551  WBC 7.2  HGB 10.9*  HCT 32.7*  PLT 227   ------------------------------------------------------------------------------------------------------------------  Chemistries   Recent Labs Lab 03/09/16 1617 03/10/16 0551  NA 133* 132*  K 4.5 4.5  CL 88* 89*  CO2 34* 32  GLUCOSE 107* 134*  BUN 74* 82*  CREATININE 1.62* 1.87*  CALCIUM 8.9 8.1*  MG  --  2.7*  AST 32  --   ALT 23  --   ALKPHOS 63  --   BILITOT 0.7  --    ------------------------------------------------------------------------------------------------------------------  Cardiac Enzymes No results for input(s): TROPONINI in the last 168 hours. ------------------------------------------------------------------------------------------------------------------  RADIOLOGY:  Dg Abdomen 1 View  Result Date: 03/09/2016 CLINICAL DATA:  Nasogastric tube placement. Vomiting, acute onset. Initial encounter. EXAM: ABDOMEN - 1 VIEW COMPARISON:  Abdominal radiograph performed earlier today at 4:42 p.m. FINDINGS: The patient's enteric tube is noted ending overlying the body of the stomach, with the side port noted about the gastroesophageal junction. A pacemaker/AICD is noted overlying the right chest wall, with leads ending overlying the right atrium and right ventricle. Mild left basilar atelectasis is noted. Distended small bowel loops are again seen. No free intra-abdominal air is identified on this semi-upright view. No acute osseous abnormalities are  seen. IMPRESSION: 1. Enteric tube noted ending overlying the body of the stomach, with the side port about the gastroesophageal junction. 2. Mild left basilar atelectasis noted. Electronically Signed   By: Roanna Raider M.D.   On: 03/09/2016 18:50   Dg Abd 2 Views  Result Date: 03/09/2016 CLINICAL DATA:  Abdominal distension and discomfort question bowel obstruction EXAM: ABDOMEN - 2 VIEW COMPARISON:  CT abdomen pelvis 03/04/2011 FINDINGS: Bibasilar atelectasis. Pacemaker/AICD leads project over heart. Air-filled dilated loops of small bowel, out of proportion to the small amount gas and stool present in the colon. Findings concerning for small bowel obstruction. No definite bowel wall thickening or free air. Marked osseous demineralization. IMPRESSION: Distended small bowel loops in the abdomen out of proportion to the degree of gas and stool within the colon question small bowel obstruction. Electronically Signed   By: Angelyn Punt.D.  On: 03/09/2016 17:13    EKG:   Orders placed or performed during the hospital encounter of 10/11/14  . EKG 12-Lead  . EKG 12-Lead  . EKG 12-Lead  . EKG 12-Lead  . EKG    IMPRESSION AND PLAN:   Aubryanna Trom  is a 83 y.o. female with a known history of Nonischemic cardiomyopathy status post defibrillated, history of complete heart block status post pacemaker, COPD, degenerative disc disease, history of bowel obstructions, GERD, depression was independent from home presents to hospital secondary to abdominal pain and nausea vomiting.  #1  SBO-management per surgical team. -Due to high surgical risk, patient is being managed conservatively. Has had admissions for recurrent bowel obstructions. -Continue NG tube suction. Nothing by mouth.  #2 Cardiomyopathy s/p AICD-stable at this time. No fluid overload. Hold losartan and Lasix. Continue digoxin- changed to IV -Monitor on telemetry  #3 ARF-likely from dehydration and prerenal causes. Gentle hydration and  follow up tomorrow. -Avoid nephrotoxins  #4 GERD-or coffee ground emesis and on suction, Protonix IV started twice a day. -Hold NSAIDS  #5 Arthritis-hold her pain medications at this time.  #6 DVT prophylaxis- on subcutaneous heparin  Updated patient and niece at bedside   All the records are reviewed and case discussed with Consulting provider. Management plans discussed with the patient, family and they are in agreement.  CODE STATUS: Full Code  TOTAL TIME TAKING CARE OF THIS PATIENT: 50 minutes.    Enid Baas M.D on 03/10/2016 at 12:38 PM  Between 7am to 6pm - Pager - [phone]  After 6pm go to [email or web address] - password [email or web address] [facility]  [facility]  Office  [phone]  CC: Primary care Physician: Lynnea Ferrier, MD

## 2016-03-10 NOTE — Plan of Care (Signed)
Problem: Safety: Goal: Ability to remain free from injury will improve Outcome: Progressing Pt has remained free from falls during my care. Bed alarm activated and call bell within reach.   Problem: Pain Managment: Goal: General experience of comfort will improve Outcome: Progressing Pt complained of pain 7/10 during my shift and received PRN pain medication. At recheck, pt stated that pain had not resolved, but seemed to have moved and was more generalized.

## 2016-03-10 NOTE — Progress Notes (Signed)
Provider notified of possible urinary retention. Pt has not voided but was I&O cathed for 900 ml for a urine sample.

## 2016-03-10 NOTE — Progress Notes (Signed)
CC: SBO Subjective: Feeling better than yesterday but still has some intermittent pain NGT 250cc No flatus  Objective: Vital signs in last 24 hours: Temp:  [97.5 F (36.4 C)-99.4 F (37.4 C)] 97.5 F (36.4 C) (01/14 0749) Pulse Rate:  [77-97] 97 (01/14 0749) Resp:  [16-28] 17 (01/14 0749) BP: (100-132)/(54-76) 100/54 (01/14 0749) SpO2:  [88 %-96 %] 90 % (01/14 0749) Weight:  [75.3 kg (166 lb)] 75.3 kg (166 lb) (01/13 1612) Last BM Date: 03/09/16  Intake/Output from previous day: 01/13 0701 - 01/14 0700 In: 748.8 [I.V.:718.8; NG/GT:30] Out: 250 [Emesis/NG output:250] Intake/Output this shift: No intake/output data recorded.  Physical exam: Debilitated elderly female in NAD Abd: soft, mildly distended, no peritonitis, mild TTP LLQ.  Reducible ventral hernia Ext: well perfused and warm  Lab Results: CBC   Recent Labs  03/09/16 1617 03/10/16 0551  WBC 11.7* 7.2  HGB 10.8* 10.9*  HCT 32.2* 32.7*  PLT 226 227   BMET  Recent Labs  03/09/16 1617 03/10/16 0551  NA 133* 132*  K 4.5 4.5  CL 88* 89*  CO2 34* 32  GLUCOSE 107* 134*  BUN 74* 82*  CREATININE 1.62* 1.87*  CALCIUM 8.9 8.1*   PT/INR No results for input(s): LABPROT, INR in the last 72 hours. ABG No results for input(s): PHART, HCO3 in the last 72 hours.  Invalid input(s): PCO2, PO2  Studies/Results: Dg Abdomen 1 View  Result Date: 03/09/2016 CLINICAL DATA:  Nasogastric tube placement. Vomiting, acute onset. Initial encounter. EXAM: ABDOMEN - 1 VIEW COMPARISON:  Abdominal radiograph performed earlier today at 4:42 p.m. FINDINGS: The patient's enteric tube is noted ending overlying the body of the stomach, with the side port noted about the gastroesophageal junction. A pacemaker/AICD is noted overlying the right chest wall, with leads ending overlying the right atrium and right ventricle. Mild left basilar atelectasis is noted. Distended small bowel loops are again seen. No free intra-abdominal air is  identified on this semi-upright view. No acute osseous abnormalities are seen. IMPRESSION: 1. Enteric tube noted ending overlying the body of the stomach, with the side port about the gastroesophageal junction. 2. Mild left basilar atelectasis noted. Electronically Signed   By: Roanna Raider M.D.   On: 03/09/2016 18:50   Dg Abd 2 Views  Result Date: 03/09/2016 CLINICAL DATA:  Abdominal distension and discomfort question bowel obstruction EXAM: ABDOMEN - 2 VIEW COMPARISON:  CT abdomen pelvis 03/04/2011 FINDINGS: Bibasilar atelectasis. Pacemaker/AICD leads project over heart. Air-filled dilated loops of small bowel, out of proportion to the small amount gas and stool present in the colon. Findings concerning for small bowel obstruction. No definite bowel wall thickening or free air. Marked osseous demineralization. IMPRESSION: Distended small bowel loops in the abdomen out of proportion to the degree of gas and stool within the colon question small bowel obstruction. Electronically Signed   By: Ulyses Southward M.D.   On: 03/09/2016 17:13    Anti-infectives: Anti-infectives    None      Assessment/Plan: SBO continue NGT She is very debilitated with multiple medical issues and medical rx is in her best interest Order KUB this am Consult hospitalist Increase IVF since her BUN and creat are worst today No surgical intervention at this time   Sterling Big, MD, FACS  03/10/2016

## 2016-03-10 NOTE — Progress Notes (Signed)
NGT advanced 4 cm as ordered. Placement verified by auscultation.

## 2016-03-11 ENCOUNTER — Inpatient Hospital Stay: Payer: Medicare Other

## 2016-03-11 LAB — CBC
HEMATOCRIT: 28.2 % — AB (ref 35.0–47.0)
HEMOGLOBIN: 9.6 g/dL — AB (ref 12.0–16.0)
MCH: 31.6 pg (ref 26.0–34.0)
MCHC: 33.9 g/dL (ref 32.0–36.0)
MCV: 93.1 fL (ref 80.0–100.0)
Platelets: 208 10*3/uL (ref 150–440)
RBC: 3.03 MIL/uL — ABNORMAL LOW (ref 3.80–5.20)
RDW: 16.5 % — AB (ref 11.5–14.5)
WBC: 8.9 10*3/uL (ref 3.6–11.0)

## 2016-03-11 LAB — BASIC METABOLIC PANEL
ANION GAP: 8 (ref 5–15)
BUN: 80 mg/dL — AB (ref 6–20)
CALCIUM: 8.2 mg/dL — AB (ref 8.9–10.3)
CO2: 35 mmol/L — AB (ref 22–32)
Chloride: 94 mmol/L — ABNORMAL LOW (ref 101–111)
Creatinine, Ser: 1.47 mg/dL — ABNORMAL HIGH (ref 0.44–1.00)
GFR calc Af Amer: 36 mL/min — ABNORMAL LOW (ref >60)
GFR calc non Af Amer: 31 mL/min — ABNORMAL LOW (ref >60)
Glucose, Bld: 142 mg/dL — ABNORMAL HIGH (ref 65–99)
POTASSIUM: 3.6 mmol/L (ref 3.5–5.1)
Sodium: 137 mmol/L (ref 135–145)

## 2016-03-11 MED ORDER — HYDROCODONE-ACETAMINOPHEN 5-325 MG PO TABS
2.0000 | ORAL_TABLET | Freq: Once | ORAL | Status: AC
  Administered 2016-03-11: 2 via ORAL
  Filled 2016-03-11: qty 2

## 2016-03-11 MED ORDER — PHENOL 1.4 % MT LIQD
1.0000 | OROMUCOSAL | Status: DC | PRN
  Administered 2016-03-11 – 2016-03-13 (×3): 1 via OROMUCOSAL
  Filled 2016-03-11 (×2): qty 177

## 2016-03-11 MED ORDER — POTASSIUM CHLORIDE 2 MEQ/ML IV SOLN
INTRAVENOUS | Status: DC
  Administered 2016-03-11 – 2016-03-13 (×4): via INTRAVENOUS
  Filled 2016-03-11 (×7): qty 1000

## 2016-03-11 MED ORDER — SODIUM CHLORIDE 0.9 % IV BOLUS (SEPSIS)
500.0000 mL | Freq: Once | INTRAVENOUS | Status: AC
  Administered 2016-03-11: 500 mL via INTRAVENOUS

## 2016-03-11 NOTE — Progress Notes (Signed)
Dr. Michela Pitcher notified of BP 94/54 and patient requesting pain medication. MD order to give 2 Vidocin once and 500 mL Normal Saline bolus. Will continue to monitor.

## 2016-03-11 NOTE — Progress Notes (Signed)
[facility] [place] [facility]   PATIENT NAME: [name]    MR#:  [id number]  DATE OF BIRTH:  1927-05-17  SUBJECTIVE:  CHIEF COMPLAINT:   Chief Complaint  Patient presents with  . Constipation   - confusion noted last night as patient was trying to pull her NG tube out - more alert now, NG in draining almost 900cc brownish fluid  REVIEW OF SYSTEMS:  Review of Systems  Constitutional: Positive for malaise/fatigue. Negative for chills and fever.  HENT: Positive for hearing loss. Negative for congestion, ear discharge and nosebleeds.   Eyes: Negative for blurred vision and double vision.  Respiratory: Negative for cough, shortness of breath and wheezing.   Cardiovascular: Negative for chest pain, palpitations and leg swelling.  Gastrointestinal: Positive for abdominal pain and nausea. Negative for constipation, diarrhea and vomiting.  Genitourinary: Negative for dysuria.  Musculoskeletal: Positive for myalgias.  Neurological: Negative for dizziness, seizures and headaches.  Psychiatric/Behavioral: Negative for depression.    DRUG ALLERGIES:   Allergies  Allergen Reactions  . Toprol Xl [Metoprolol] Other (See Comments)    Reaction: skin pulls away from the bottom of feet  . Ace Inhibitors Cough  . Diltiazem Hcl Other (See Comments)    VITALS:  Blood pressure (!) 87/53, pulse 86, temperature 97.3 F (36.3 C), temperature source Oral, resp. rate 16, height 5\' 3"  (1.6 m), weight 75.3 kg (166 lb), SpO2 90 %.  PHYSICAL EXAMINATION:  Physical Exam  GENERAL:  83 y.o.-year-old patient lying in the bed with no acute distress. Appears very tired. EYES: Pupils equal, round, reactive to light and accommodation. No scleral icterus. Extraocular muscles intact.  HEENT: Head atraumatic, normocephalic. Oropharynx and nasopharynx clear.  NECK:  Supple, no jugular venous distention. No thyroid enlargement, no tenderness.  LUNGS: Normal breath sounds bilaterally,  no wheezing, rales,rhonchi or crepitation. No use of accessory muscles of respiration. Decreased bibasilar breath sounds CARDIOVASCULAR: S1, S2 normal. No rubs, or gallops. 3/6 systolic murmur present. ABDOMEN: distended, ventral hernias present, diffuse tenderness all over, hypoactive Bowel sounds. No organomegaly or mass.  EXTREMITIES: No pedal edema, cyanosis, or clubbing.  NEUROLOGIC: Cranial nerves II through XII are intact. Muscle strength 5/5 in all extremities. Sensation intact. Gait not checked. Global weakness noted. PSYCHIATRIC: The patient is alert and oriented x 3. Minimal confusion noted. SKIN: No obvious rash, lesion, or ulcer.   LABORATORY PANEL:   CBC  Recent Labs Lab 03/11/16 0516  WBC 8.9  HGB 9.6*  HCT 28.2*  PLT 208   ------------------------------------------------------------------------------------------------------------------  Chemistries   Recent Labs Lab 03/09/16 1617 03/10/16 0551 03/11/16 0516  NA 133* 132* 137  K 4.5 4.5 3.6  CL 88* 89* 94*  CO2 34* 32 35*  GLUCOSE 107* 134* 142*  BUN 74* 82* 80*  CREATININE 1.62* 1.87* 1.47*  CALCIUM 8.9 8.1* 8.2*  MG  --  2.7*  --   AST 32  --   --   ALT 23  --   --   ALKPHOS 63  --   --   BILITOT 0.7  --   --    ------------------------------------------------------------------------------------------------------------------  Cardiac Enzymes No results for input(s): TROPONINI in the last 168 hours. ------------------------------------------------------------------------------------------------------------------  RADIOLOGY:  Dg Abdomen 1 View  Result Date: 03/09/2016 CLINICAL DATA:  Nasogastric tube placement. Vomiting, acute onset. Initial encounter. EXAM: ABDOMEN - 1 VIEW COMPARISON:  Abdominal radiograph performed earlier today at 4:42 p.m. FINDINGS: The patient's enteric tube is noted ending overlying the body  of the stomach, with the side port noted about the gastroesophageal junction. A  pacemaker/AICD is noted overlying the right chest wall, with leads ending overlying the right atrium and right ventricle. Mild left basilar atelectasis is noted. Distended small bowel loops are again seen. No free intra-abdominal air is identified on this semi-upright view. No acute osseous abnormalities are seen. IMPRESSION: 1. Enteric tube noted ending overlying the body of the stomach, with the side port about the gastroesophageal junction. 2. Mild left basilar atelectasis noted. Electronically Signed   By: Roanna Raider M.D.   On: 03/09/2016 18:50   Dg Abd 2 Views  Result Date: 03/09/2016 CLINICAL DATA:  Abdominal distension and discomfort question bowel obstruction EXAM: ABDOMEN - 2 VIEW COMPARISON:  CT abdomen pelvis 03/04/2011 FINDINGS: Bibasilar atelectasis. Pacemaker/AICD leads project over heart. Air-filled dilated loops of small bowel, out of proportion to the small amount gas and stool present in the colon. Findings concerning for small bowel obstruction. No definite bowel wall thickening or free air. Marked osseous demineralization. IMPRESSION: Distended small bowel loops in the abdomen out of proportion to the degree of gas and stool within the colon question small bowel obstruction. Electronically Signed   By: Ulyses Southward M.D.   On: 03/09/2016 17:13   Dg Abd Acute W/chest  Result Date: 03/11/2016 CLINICAL DATA:  Small-bowel obstruction . EXAM: DG ABDOMEN ACUTE W/ 1V CHEST COMPARISON:  03/10/2016. FINDINGS: NG tube in stable position with its tip in the upper stomach. AICD in stable position. Heart size normal. No focal alveolar infiltrate. No pleural effusion. Persistent dilated loops of small bowel are noted without significant change. Findings consistent with persistent small-bowel obstruction . IMPRESSION: 1. NG tube noted with its tip and side hole over the upper stomach. 2. Persistent dilated loops of small bowel consistent with persistent small-bowel obstruction. No free air. Similar  findings noted on prior exam. 3. No acute cardiopulmonary disease. Electronically Signed   By: Maisie Fus  Register   On: 03/11/2016 08:11   Dg Abd Portable 2v  Result Date: 03/10/2016 CLINICAL DATA:  Small bowel obstruction.  Ileus. EXAM: PORTABLE ABDOMEN - 2 VIEW COMPARISON:  Two-view abdomen 03/09/2016 FINDINGS: The side port of the NGT remains at the GE junction and could be advanced for more optimal positioning. Lung bases are clear. Gaseous distention of small bowel is improved. Fluid levels remain. There is no free air. Degenerative changes are again noted in the lower lumbar spine. IMPRESSION: 1. Decreasing gaseous distention of small bowel with persistent fluid levels compatible with ileus. 2. The side port of the NGT in is at the GE junction and could be advanced for more optimal positioning. Electronically Signed   By: Marin Roberts M.D.   On: 03/10/2016 14:04    EKG:   Orders placed or performed during the hospital encounter of 10/11/14  . EKG 12-Lead  . EKG 12-Lead  . EKG 12-Lead  . EKG 12-Lead  . EKG    ASSESSMENT AND PLAN:   Samyia Towner  is a 83 y.o. female with a known history of Nonischemic cardiomyopathy status post defibrillated, history of complete heart block status post pacemaker, COPD, degenerative disc disease, history of bowel obstructions, GERD, depression was independent from home presents to hospital secondary to abdominal pain and nausea vomiting.  #1  SBO-management per surgical team. -Due to high surgical risk, patient is being managed conservatively. Has had admissions for recurrent bowel obstructions. -Continue NG tube suction. Nothing by mouth except ice chips  #2 Cardiomyopathy  s/p AICD-stable at this time. No fluid overload. Hold losartan and Lasix. Continue digoxin- changed to IV -Monitor on telemetry  #3 ARF-likely from dehydration and prerenal causes. Gentle hydration and some improvement noted.. -Avoid nephrotoxins  #4 GERD-or coffee  ground emesis on suction, Protonix IV twice a day. -Hold NSAIDS  #5 Acute delirium- No dementia at baseline. Likely delirium. Monitor.  #6 DVT prophylaxis- on subcutaneous heparin  Updated patient and son at bedside     All the records are reviewed and case discussed with Care Management/Social Workerr. Management plans discussed with the patient, family and they are in agreement.  CODE STATUS: DNR  TOTAL TIME TAKING CARE OF THIS PATIENT: 32 minutes.   POSSIBLE D/C IN 2-3 DAYS, DEPENDING ON CLINICAL CONDITION.   Enid Baas M.D on 03/11/2016 at 11:14 AM  Between 7am to 6pm - Pager - [phone]  After 6pm go to [email or web address] - Social research officer, government  [facility]  Office  [phone]  CC: Primary care physician; Lynnea Ferrier, MD

## 2016-03-11 NOTE — Progress Notes (Signed)
Patient and son voiced assertion that she is a DNR and not to be coded; Dr. Tonita Cong notified; reviewed Notes, noted that patient was DNR; purple armband applied to patient's arm; Windy Carina, RN 6:25 AM1/15/2018

## 2016-03-11 NOTE — Progress Notes (Signed)
Subjective:   She is very confused this morning. She has been attempting to pull out her nasogastric tube. She complains of persistent nausea. She's not passed any gas as yet. She's not had a bowel movement. She's cooperative and seems to recognize me. Plain films today are really not improved.  Vital signs in last 24 hours: Temp:  [97.3 F (36.3 C)-98.2 F (36.8 C)] 97.3 F (36.3 C) (01/15 0833) Pulse Rate:  [58-98] 86 (01/15 0833) Resp:  [16-18] 16 (01/15 0833) BP: (87-120)/(53-85) 87/53 (01/15 0833) SpO2:  [90 %-91 %] 90 % (01/15 0833) Last BM Date: 03/09/16  Intake/Output from previous day: 01/14 0701 - 01/15 0700 In: 1756.7 [I.V.:1726.7] Out: 2240 [Urine:1900; Emesis/NG output:340]  Exam:  Her abdomen is soft and her hernias easily reducible. She does have some bowel sounds. There does not appear to be any rebound or guarding. She denies any abdominal tenderness.  Lab Results:  CBC  Recent Labs  03/10/16 0551 03/11/16 0516  WBC 7.2 8.9  HGB 10.9* 9.6*  HCT 32.7* 28.2*  PLT 227 208   CMP     Component Value Date/Time   NA 137 03/11/2016 0516   NA 134 (L) 05/22/2012 0412   K 3.6 03/11/2016 0516   K 4.5 05/22/2012 0412   CL 94 (L) 03/11/2016 0516   CL 102 05/22/2012 0412   CO2 35 (H) 03/11/2016 0516   CO2 26 05/22/2012 0412   GLUCOSE 142 (H) 03/11/2016 0516   GLUCOSE 141 (H) 05/22/2012 0412   BUN 80 (H) 03/11/2016 0516   BUN 60 (H) 05/22/2012 0412   CREATININE 1.47 (H) 03/11/2016 0516   CREATININE 1.26 05/22/2012 0412   CALCIUM 8.2 (L) 03/11/2016 0516   CALCIUM 8.6 05/22/2012 0412   PROT 7.6 03/09/2016 1617   PROT 7.3 05/22/2012 0412   ALBUMIN 3.5 03/09/2016 1617   ALBUMIN 3.1 (L) 05/22/2012 0412   AST 32 03/09/2016 1617   AST 37 05/22/2012 0412   ALT 23 03/09/2016 1617   ALT 24 05/22/2012 0412   ALKPHOS 63 03/09/2016 1617   ALKPHOS 74 05/22/2012 0412   BILITOT 0.7 03/09/2016 1617   BILITOT 0.5 05/22/2012 0412   GFRNONAA 31 (L) 03/11/2016 0516   GFRNONAA 40 (L) 05/22/2012 0412   GFRAA 36 (L) 03/11/2016 0516   GFRAA 46 (L) 05/22/2012 0412   PT/INR No results for input(s): LABPROT, INR in the last 72 hours.  Studies/Results: Dg Abdomen 1 View  Result Date: 03/09/2016 CLINICAL DATA:  Nasogastric tube placement. Vomiting, acute onset. Initial encounter. EXAM: ABDOMEN - 1 VIEW COMPARISON:  Abdominal radiograph performed earlier today at 4:42 p.m. FINDINGS: The patient's enteric tube is noted ending overlying the body of the stomach, with the side port noted about the gastroesophageal junction. A pacemaker/AICD is noted overlying the right chest wall, with leads ending overlying the right atrium and right ventricle. Mild left basilar atelectasis is noted. Distended small bowel loops are again seen. No free intra-abdominal air is identified on this semi-upright view. No acute osseous abnormalities are seen. IMPRESSION: 1. Enteric tube noted ending overlying the body of the stomach, with the side port about the gastroesophageal junction. 2. Mild left basilar atelectasis noted. Electronically Signed   By: Roanna Raider M.D.   On: 03/09/2016 18:50   Dg Abd 2 Views  Result Date: 03/09/2016 CLINICAL DATA:  Abdominal distension and discomfort question bowel obstruction EXAM: ABDOMEN - 2 VIEW COMPARISON:  CT abdomen pelvis 03/04/2011 FINDINGS: Bibasilar atelectasis. Pacemaker/AICD leads project over heart.  Air-filled dilated loops of small bowel, out of proportion to the small amount gas and stool present in the colon. Findings concerning for small bowel obstruction. No definite bowel wall thickening or free air. Marked osseous demineralization. IMPRESSION: Distended small bowel loops in the abdomen out of proportion to the degree of gas and stool within the colon question small bowel obstruction. Electronically Signed   By: Ulyses Southward M.D.   On: 03/09/2016 17:13   Dg Abd Acute W/chest  Result Date: 03/11/2016 CLINICAL DATA:  Small-bowel obstruction .  EXAM: DG ABDOMEN ACUTE W/ 1V CHEST COMPARISON:  03/10/2016. FINDINGS: NG tube in stable position with its tip in the upper stomach. AICD in stable position. Heart size normal. No focal alveolar infiltrate. No pleural effusion. Persistent dilated loops of small bowel are noted without significant change. Findings consistent with persistent small-bowel obstruction . IMPRESSION: 1. NG tube noted with its tip and side hole over the upper stomach. 2. Persistent dilated loops of small bowel consistent with persistent small-bowel obstruction. No free air. Similar findings noted on prior exam. 3. No acute cardiopulmonary disease. Electronically Signed   By: Maisie Fus  Register   On: 03/11/2016 08:11   Dg Abd Portable 2v  Result Date: 03/10/2016 CLINICAL DATA:  Small bowel obstruction.  Ileus. EXAM: PORTABLE ABDOMEN - 2 VIEW COMPARISON:  Two-view abdomen 03/09/2016 FINDINGS: The side port of the NGT remains at the GE junction and could be advanced for more optimal positioning. Lung bases are clear. Gaseous distention of small bowel is improved. Fluid levels remain. There is no free air. Degenerative changes are again noted in the lower lumbar spine. IMPRESSION: 1. Decreasing gaseous distention of small bowel with persistent fluid levels compatible with ileus. 2. The side port of the NGT in is at the GE junction and could be advanced for more optimal positioning. Electronically Signed   By: Marin Roberts M.D.   On: 03/10/2016 14:04    Assessment/Plan: She has had multiple problems with her persistent small bowel obstructions. Ideally she should have surgery in order to resolve this problem and avoid these multiple admissions. However, her general health likely precludes a successful outcome. We will continue our current decompressive therapy. We appreciate the internal medicine assistance. At this point we will make no decisions regarding any further intervention

## 2016-03-12 ENCOUNTER — Inpatient Hospital Stay: Payer: Medicare Other

## 2016-03-12 LAB — URINE CULTURE: Culture: NO GROWTH

## 2016-03-12 LAB — BASIC METABOLIC PANEL
ANION GAP: 5 (ref 5–15)
BUN: 52 mg/dL — ABNORMAL HIGH (ref 6–20)
CO2: 37 mmol/L — ABNORMAL HIGH (ref 22–32)
Calcium: 7.9 mg/dL — ABNORMAL LOW (ref 8.9–10.3)
Chloride: 99 mmol/L — ABNORMAL LOW (ref 101–111)
Creatinine, Ser: 0.87 mg/dL (ref 0.44–1.00)
GFR, EST NON AFRICAN AMERICAN: 59 mL/min — AB (ref >60)
Glucose, Bld: 103 mg/dL — ABNORMAL HIGH (ref 65–99)
POTASSIUM: 4.3 mmol/L (ref 3.5–5.1)
SODIUM: 141 mmol/L (ref 135–145)

## 2016-03-12 LAB — DIGOXIN LEVEL: DIGOXIN LVL: 0.9 ng/mL (ref 0.8–2.0)

## 2016-03-12 NOTE — Progress Notes (Signed)
83 yr old female with multiple medical issues with SBO and some confusion.  She seems to be answering appropriately today.  She states that she did pass some flatus and feels softer today.  She states some pain in the LLQ.   Vitals:   03/12/16 0745 03/12/16 1325  BP: 119/67 122/70  Pulse: 83 81  Resp: 18 17  Temp: 97.8 F (36.6 C)    I/O last 3 completed shifts: In: 7122.5 [I.V.:4872.5; Other:2250] Out: 580 [Emesis/NG output:580] No intake/output data recorded.   PE:  Gen: NAD Res: CTAB/L  Cardio: RRR Abd: soft, mild distension, minimal tenderness in LLQ Ext: no edema  CBC Latest Ref Rng & Units 03/11/2016 03/10/2016 03/09/2016  WBC 3.6 - 11.0 K/uL 8.9 7.2 11.7(H)  Hemoglobin 12.0 - 16.0 g/dL 1.7(R) 10.9(L) 10.8(L)  Hematocrit 35.0 - 47.0 % 28.2(L) 32.7(L) 32.2(L)  Platelets 150 - 440 K/uL 208 227 226   CMP Latest Ref Rng & Units 03/12/2016 03/11/2016 03/10/2016  Glucose 65 - 99 mg/dL 116(F) 790(X) 833(X)  BUN 6 - 20 mg/dL 83(A) 91(B) 16(O)  Creatinine 0.44 - 1.00 mg/dL 0.60 0.45(T) 9.77(S)  Sodium 135 - 145 mmol/L 141 137 132(L)  Potassium 3.5 - 5.1 mmol/L 4.3 3.6 4.5  Chloride 101 - 111 mmol/L 99(L) 94(L) 89(L)  CO2 22 - 32 mmol/L 37(H) 35(H) 32  Calcium 8.9 - 10.3 mg/dL 7.9(L) 8.2(L) 8.1(L)  Total Protein 6.5 - 8.1 g/dL - - -  Total Bilirubin 0.3 - 1.2 mg/dL - - -  Alkaline Phos 38 - 126 U/L - - -  AST 15 - 41 U/L - - -  ALT 14 - 54 U/L - - -   A/P:  82 yr old female with multiple medical issues with SBO and some confusion.  NG tube output decreases and less over last 12 hours, with softness and some passing of gas, will evaluate overnight and if minimal will likely d/c NG tube in AM.

## 2016-03-12 NOTE — Care Management Important Message (Signed)
Important Message  Patient Details  Name: [name] MRN: [id number] Date of Birth: [birth year]   Medicare Important Message Given:  Yes    Chapman Fitch, RN 03/12/2016, 4:29 PM

## 2016-03-12 NOTE — Progress Notes (Signed)
[employer] [facility] - [place] at [facility]   PATIENT NAME: [name]    MR#:  [id number]  DATE OF BIRTH:  [birth year]  SUBJECTIVE:  CHIEF COMPLAINT:   Chief Complaint  Patient presents with  . Constipation   - more alert today, NG drainage has improved - still with hypoactive bowel sounds  REVIEW OF SYSTEMS:  Review of Systems  Constitutional: Positive for malaise/fatigue. Negative for chills and fever.  HENT: Positive for hearing loss. Negative for congestion, ear discharge and nosebleeds.   Eyes: Negative for blurred vision and double vision.  Respiratory: Negative for cough, shortness of breath and wheezing.   Cardiovascular: Negative for chest pain, palpitations and leg swelling.  Gastrointestinal: Positive for abdominal pain and nausea. Negative for constipation, diarrhea and vomiting.  Genitourinary: Negative for dysuria.  Musculoskeletal: Positive for myalgias.  Neurological: Negative for dizziness, seizures and headaches.  Psychiatric/Behavioral: Negative for depression.    DRUG ALLERGIES:   Allergies  Allergen Reactions  . Toprol Xl [Metoprolol] Other (See Comments)    Reaction: skin pulls away from the bottom of feet  . Ace Inhibitors Cough  . Diltiazem Hcl Other (See Comments)    VITALS:  Blood pressure 122/70, pulse 81, temperature 97.8 F (36.6 C), temperature source Oral, resp. rate 17, height 5\' 3"  (1.6 m), weight 75.3 kg (166 lb), SpO2 95 %.  PHYSICAL EXAMINATION:  Physical Exam  GENERAL:  83 y.o.-year-old patient lying in the bed with no acute distress. Appears very tired. EYES: Pupils equal, round, reactive to light and accommodation. No scleral icterus. Extraocular muscles intact.  HEENT: Head atraumatic, normocephalic. Oropharynx and nasopharynx clear.  NECK:  Supple, no jugular venous distention. No thyroid enlargement, no tenderness.  LUNGS: Normal breath sounds bilaterally, no wheezing, rales,rhonchi or crepitation. No use of  accessory muscles of respiration. Decreased bibasilar breath sounds CARDIOVASCULAR: S1, S2 normal. No rubs, or gallops. 3/6 systolic murmur present. ABDOMEN: distended, ventral hernias present, diffuse tenderness all over, hypoactive Bowel sounds. No organomegaly or mass.  EXTREMITIES: No pedal edema, cyanosis, or clubbing. Right hip bruise NEUROLOGIC: Cranial nerves II through XII are intact. Muscle strength 5/5 in all extremities. Sensation intact. Gait not checked. Global weakness noted. PSYCHIATRIC: The patient is alert and oriented x 3. Minimal confusion noted. SKIN: No obvious rash, lesion, or ulcer.   LABORATORY PANEL:   CBC  Recent Labs Lab 03/11/16 0516  WBC 8.9  HGB 9.6*  HCT 28.2*  PLT 208   ------------------------------------------------------------------------------------------------------------------  Chemistries   Recent Labs Lab 03/09/16 1617 03/10/16 0551  03/12/16 0406  NA 133* 132*  < > 141  K 4.5 4.5  < > 4.3  CL 88* 89*  < > 99*  CO2 34* 32  < > 37*  GLUCOSE 107* 134*  < > 103*  BUN 74* 82*  < > 52*  CREATININE 1.62* 1.87*  < > 0.87  CALCIUM 8.9 8.1*  < > 7.9*  MG  --  2.7*  --   --   AST 32  --   --   --   ALT 23  --   --   --   ALKPHOS 63  --   --   --   BILITOT 0.7  --   --   --   < > = values in this interval not displayed. ------------------------------------------------------------------------------------------------------------------  Cardiac Enzymes No results for input(s): TROPONINI in the last 168 hours. ------------------------------------------------------------------------------------------------------------------  RADIOLOGY:  Dg Abd Acute W/chest  Result Date: 03/11/2016  CLINICAL DATA:  Small-bowel obstruction . EXAM: DG ABDOMEN ACUTE W/ 1V CHEST COMPARISON:  03/10/2016. FINDINGS: NG tube in stable position with its tip in the upper stomach. AICD in stable position. Heart size normal. No focal alveolar infiltrate. No pleural  effusion. Persistent dilated loops of small bowel are noted without significant change. Findings consistent with persistent small-bowel obstruction . IMPRESSION: 1. NG tube noted with its tip and side hole over the upper stomach. 2. Persistent dilated loops of small bowel consistent with persistent small-bowel obstruction. No free air. Similar findings noted on prior exam. 3. No acute cardiopulmonary disease. Electronically Signed   By: Maisie Fus  Register   On: 03/11/2016 08:11   Dg Hip Unilat With Pelvis 2-3 Views Right  Result Date: 03/12/2016 CLINICAL DATA:  Fall, right hip pain EXAM: DG HIP (WITH OR WITHOUT PELVIS) 2-3V RIGHT COMPARISON:  None. FINDINGS: Three views of the right hip submitted. Study is limited by diffuse osteopenia. No gross fracture or subluxation. Degenerative changes bilateral SI joints. IMPRESSION: Limited study by diffuse osteopenia. No gross fracture or subluxation. Electronically Signed   By: Natasha Mead M.D.   On: 03/12/2016 15:42    EKG:   Orders placed or performed during the hospital encounter of 10/11/14  . EKG 12-Lead  . EKG 12-Lead  . EKG 12-Lead  . EKG 12-Lead  . EKG    ASSESSMENT AND PLAN:   [name]  is a 83 y.o. female with a known history of Nonischemic cardiomyopathy status post defibrillated, history of complete heart block status post pacemaker, COPD, degenerative disc disease, history of bowel obstructions, GERD, depression was independent from home presents to hospital secondary to abdominal pain and nausea vomiting.  #1  SBO-management per surgical team. -Due to high surgical risk, patient is being managed conservatively. Has had admissions for recurrent bowel obstructions. -Continue NG tube suction. Nothing by mouth except ice chips - improving drainage, no improvement in KUB yet- monitor  #2 Cardiomyopathy s/p AICD-stable at this time. No fluid overload. Hold losartan and Lasix. Continue digoxin- changed to IV -Monitor on telemetry  #3  ARF-likely from dehydration and prerenal causes. Gentle hydration and improvement noted.. -Avoid nephrotoxins  #4 GERD-or coffee ground emesis on suction, Protonix IV twice a day. -Hold NSAIDS  #5 Acute delirium- No dementia at baseline. Improving delirium today Monitor.  #6 DVT prophylaxis- on subcutaneous heparin  Updated patient and son at bedside Physical therapy consult today   All the records are reviewed and case discussed with Care Management/Social Workerr. Management plans discussed with the patient, family and they are in agreement.  CODE STATUS: DNR  TOTAL TIME TAKING CARE OF THIS PATIENT: 32 minutes.   POSSIBLE D/C IN 2-3 DAYS, DEPENDING ON CLINICAL CONDITION.   Enid Baas M.D on 03/12/2016 at 4:00 PM  Between 7am to 6pm - Pager - [phone]  After 6pm go to [email or web address] - Social research officer, government  [facility] [place]  Office  [phone]  CC: Primary care physician; Lynnea Ferrier, MD

## 2016-03-13 ENCOUNTER — Inpatient Hospital Stay: Payer: Medicare Other

## 2016-03-13 LAB — BASIC METABOLIC PANEL
Anion gap: 5 (ref 5–15)
BUN: 23 mg/dL — AB (ref 6–20)
CHLORIDE: 103 mmol/L (ref 101–111)
CO2: 29 mmol/L (ref 22–32)
CREATININE: 0.61 mg/dL (ref 0.44–1.00)
Calcium: 8.2 mg/dL — ABNORMAL LOW (ref 8.9–10.3)
GFR calc non Af Amer: >60 mL/min (ref >60)
Glucose, Bld: 105 mg/dL — ABNORMAL HIGH (ref 65–99)
Potassium: 4.8 mmol/L (ref 3.5–5.1)
Sodium: 137 mmol/L (ref 135–145)

## 2016-03-13 MED ORDER — ENOXAPARIN SODIUM 40 MG/0.4ML ~~LOC~~ SOLN
40.0000 mg | SUBCUTANEOUS | Status: DC
  Administered 2016-03-13 – 2016-03-17 (×5): 40 mg via SUBCUTANEOUS
  Filled 2016-03-13 (×5): qty 0.4

## 2016-03-13 MED ORDER — BISACODYL 10 MG RE SUPP
10.0000 mg | Freq: Every day | RECTAL | Status: DC
  Administered 2016-03-13 – 2016-03-18 (×4): 10 mg via RECTAL
  Filled 2016-03-13 (×5): qty 1

## 2016-03-13 MED ORDER — PANTOPRAZOLE SODIUM 40 MG IV SOLR
40.0000 mg | INTRAVENOUS | Status: DC
  Administered 2016-03-14: 40 mg via INTRAVENOUS
  Filled 2016-03-13: qty 40

## 2016-03-13 MED ORDER — KETOROLAC TROMETHAMINE 15 MG/ML IJ SOLN
15.0000 mg | Freq: Four times a day (QID) | INTRAMUSCULAR | Status: DC | PRN
  Administered 2016-03-13 – 2016-03-17 (×10): 15 mg via INTRAVENOUS
  Filled 2016-03-13 (×10): qty 1

## 2016-03-13 NOTE — Clinical Social Work Note (Signed)
Patient will return to [facility] at time of discharge. York Spaniel MSW,LCSW [phone]

## 2016-03-13 NOTE — Progress Notes (Signed)
83 yr old female with multiple medical issues with SBO verses ileus.  Patient doing very well today.  She states some crampy pain in LLQ still and passing some gas.  She denies any nausea.   Vitals:   03/13/16 1052 03/13/16 1423  BP: (!) 144/64 (!) 144/64  Pulse: 81 (!) 110  Resp:  16  Temp: 97.3 F (36.3 C) 97.6 F (36.4 C)   I/O last 3 completed shifts: In: 7490.5 [I.V.:5740.5; Other:1750] Out: 780 [Urine:400; Emesis/NG output:380] No intake/output data recorded.   PE:  Gen: NAD Res: CTAB/L  Cardio: RRR Abd: soft, mild distension, minimal tenderness in LLQ Ext: no edema  CBC Latest Ref Rng & Units 03/11/2016 03/10/2016 03/09/2016  WBC 3.6 - 11.0 K/uL 8.9 7.2 11.7(H)  Hemoglobin 12.0 - 16.0 g/dL 4.3(B) 10.9(L) 10.8(L)  Hematocrit 35.0 - 47.0 % 28.2(L) 32.7(L) 32.2(L)  Platelets 150 - 440 K/uL 208 227 226   CMP Latest Ref Rng & Units 03/13/2016 03/12/2016 03/11/2016  Glucose 65 - 99 mg/dL 357(I) 978(E) 784(X)  BUN 6 - 20 mg/dL 28(S) 08(H) 38(I)  Creatinine 0.44 - 1.00 mg/dL 7.19 5.97 4.71(E)  Sodium 135 - 145 mmol/L 137 141 137  Potassium 3.5 - 5.1 mmol/L 4.8 4.3 3.6  Chloride 101 - 111 mmol/L 103 99(L) 94(L)  CO2 22 - 32 mmol/L 29 37(H) 35(H)  Calcium 8.9 - 10.3 mg/dL 8.2(L) 7.9(L) 8.2(L)  Total Protein 6.5 - 8.1 g/dL - - -  Total Bilirubin 0.3 - 1.2 mg/dL - - -  Alkaline Phos 38 - 126 U/L - - -  AST 15 - 41 U/L - - -  ALT 14 - 54 U/L - - -   ABX: IMPRESSION: Minimal change in gas distention of the small bowel with air noted in the splenic flexure of the colon and the rectum. Appearance more compatible with persistent ileus versus partial obstruction.  A/P:  83 yr old female with multiple medical issues with SBO verses ileus and some confusion. NG tube output minimal, Abd xray some improvement with gas in colon, d/c the NG tube today, have discussed with son.  Would like to get her ambulating some as well.

## 2016-03-13 NOTE — Evaluation (Signed)
Physical Therapy Evaluation Patient Details Name: [name] MRN: [id number] DOB: [birth year] Today's Date: 03/13/2016   History of Present Illness  presented to ER with nausea/vomiting; admitted with small bowel obstruction, planning for conservative medical management at this time.  NGT in place to suction; NPO for now.  Of note, patient with recent hospitalization (1 week prior) seconday to fall with acute T7 compression fracture; TLSO to be worn with all OOB activities per previous notes.  Clinical Impression  Upon evaluation, patient alert and oriented to basic information; follows simple commands.  Intermittently confused, pulling at lines/tubes at times (safety mittens in place).  Bilat UE/LE strength and ROM grossly WFL, at least 4/5 throughout; no focal weakness or sensory deficit noted.  Able to roll (via log rolling) with min assist and use of bedrails.  Did note bruised area with palpable pocket of edema over R hip; patient reporting pain at R waistline with mobility.  Rn informed/aware and to discuss with MD (who plans to order x-ray to evaluate).  Additional activity deferred at this time, pending completion/results of x-ray. Would benefit from skilled PT to address above deficits and promote optimal return to PLOF; recommend transition to STR upon discharge from acute hospitalization.     Follow Up Recommendations SNF    Equipment Recommendations       Recommendations for Other Services OT consult     Precautions / Restrictions Precautions Precautions: Fall;Back Required Braces or Orthoses: Spinal Brace Spinal Brace: Thoracolumbosacral orthotic Restrictions Weight Bearing Restrictions: No      Mobility  Bed Mobility Overal bed mobility: Needs Assistance Bed Mobility: Rolling Rolling: Min assist         General bed mobility comments: Cues for log roll technique with increased time and effort as well as use of bed rail  Transfers                  General transfer comment: transfers/gait deferred due to pending x-ray to R hip  Ambulation/Gait             General Gait Details: transfers/gait deferred due to pending x-ray to R hip  Stairs            Wheelchair Mobility    Modified Rankin (Stroke Patients Only)       Balance                                             Pertinent Vitals/Pain Pain Assessment: Faces Faces Pain Scale: Hurts a little bit Pain Location: waistline, R hip Pain Descriptors / Indicators: Aching;Grimacing Pain Intervention(s): Limited activity within patient's tolerance;Monitored during session;Repositioned    Home Living Family/patient expects to be discharged to:: Skilled nursing facility                 Additional Comments: At baseline, lives alone in single-story home with ramp entry; family lives close and able to provide intermittent check in     Prior Function Level of Independence: Needs assistance   Gait / Transfers Assistance Needed: Ambulates with rollator at baseline with last reported fall (prior to this one) was over a year ago.  ADL's / Homemaking Assistance Needed: Son assists with cleaning and with some cooking.  Pt independently takes a sponge bath, "that's how I grew up doing it".  Comments: Has recently been at [facility] since previous hospitalization, but unable  to participate extensively with rehab since onset of SBO     Hand Dominance        Extremity/Trunk Assessment   Upper Extremity Assessment Upper Extremity Assessment: Overall WFL for tasks assessed    Lower Extremity Assessment Lower Extremity Assessment: Overall WFL for tasks assessed       Communication   Communication: No difficulties  Cognition Arousal/Alertness: Awake/alert Behavior During Therapy: WFL for tasks assessed/performed Overall Cognitive Status: Within Functional Limits for tasks assessed                 General Comments: intermittent confusion at  times    General Comments      Exercises     Assessment/Plan    PT Assessment Patient needs continued PT services  PT Problem List Decreased strength;Decreased range of motion;Decreased activity tolerance;Decreased balance;Decreased mobility;Decreased knowledge of use of DME;Decreased safety awareness;Decreased knowledge of precautions;Cardiopulmonary status limiting activity;Pain          PT Treatment Interventions DME instruction;Gait training;Functional mobility training;Therapeutic activities;Therapeutic exercise;Balance training;Patient/family education    PT Goals (Current goals can be found in the Care Plan section)  Acute Rehab PT Goals Patient Stated Goal: return to rehab PT Goal Formulation: With patient Time For Goal Achievement: 03/27/16 Potential to Achieve Goals: Good Additional Goals Additional Goal #1: Assess and establish goals for OOB activities as appropriate.    Frequency Min 2X/week   Barriers to discharge Decreased caregiver support      Co-evaluation               End of Session   Activity Tolerance: Patient tolerated treatment well Patient left: in bed;with call bell/phone within reach;with bed alarm set Nurse Communication: Mobility status         Time: 1400-1425 PT Time Calculation (min) (ACUTE ONLY): 25 min   Charges:   PT Evaluation $PT Eval Low Complexity: 1 Procedure     PT G Codes:        Marquett Bertoli H. Manson Passey, PT, DPT, NCS 03/13/16, 8:25 AM [phone]

## 2016-03-13 NOTE — Progress Notes (Signed)
[employer] - [place] at [facility]   PATIENT NAME: [name]    MR#:  [id number]  DATE OF BIRTH:  Jul 17, 1927  SUBJECTIVE:  CHIEF COMPLAINT:   Chief Complaint  Patient presents with  . Constipation   - more alert today, no NG output, patient has her back brace on and sitting in chair today - passing flatus, family present.  REVIEW OF SYSTEMS:  Review of Systems  Constitutional: Positive for malaise/fatigue. Negative for chills and fever.  HENT: Positive for hearing loss. Negative for congestion, ear discharge and nosebleeds.   Eyes: Negative for blurred vision and double vision.  Respiratory: Negative for cough, shortness of breath and wheezing.   Cardiovascular: Negative for chest pain, palpitations and leg swelling.  Gastrointestinal: Positive for abdominal pain and nausea. Negative for constipation, diarrhea and vomiting.  Genitourinary: Negative for dysuria.  Musculoskeletal: Positive for myalgias.  Neurological: Negative for dizziness, seizures and headaches.  Psychiatric/Behavioral: Negative for depression.    DRUG ALLERGIES:   Allergies  Allergen Reactions  . Toprol Xl [Metoprolol] Other (See Comments)    Reaction: skin pulls away from the bottom of feet  . Ace Inhibitors Cough  . Diltiazem Hcl Other (See Comments)    VITALS:  Blood pressure (!) 144/64, pulse 81, temperature 97.3 F (36.3 C), temperature source Oral, resp. rate 18, height 5\' 3"  (1.6 m), weight 75.3 kg (166 lb), SpO2 98 %.  PHYSICAL EXAMINATION:  Physical Exam  GENERAL:  83 y.o.-year-old patient sitting in the chair with no acute distress.  EYES: Pupils equal, round, reactive to light and accommodation. No scleral icterus. Extraocular muscles intact.  HEENT: Head atraumatic, normocephalic. Oropharynx and nasopharynx clear.  NECK:  Supple, no jugular venous distention. No thyroid enlargement, no tenderness.  LUNGS: Normal breath sounds bilaterally, no wheezing,  rales,rhonchi or crepitation. No use of accessory muscles of respiration. Decreased bibasilar breath sounds CARDIOVASCULAR: S1, S2 normal. No rubs, or gallops. 3/6 systolic murmur present. ABDOMEN: distended, ventral hernias present, diffuse tenderness all over, improved Bowel sounds. No organomegaly or mass.  EXTREMITIES: No pedal edema, cyanosis, or clubbing. Right hip bruise NEUROLOGIC: Cranial nerves II through XII are intact. Muscle strength 5/5 in all extremities. Sensation intact. Gait not checked. Global weakness noted. PSYCHIATRIC: The patient is alert and oriented x 3. Minimal confusion noted. SKIN: No obvious rash, lesion, or ulcer.   LABORATORY PANEL:   CBC  Recent Labs Lab 03/11/16 0516  WBC 8.9  HGB 9.6*  HCT 28.2*  PLT 208   ------------------------------------------------------------------------------------------------------------------  Chemistries   Recent Labs Lab 03/09/16 1617 03/10/16 0551  03/13/16 0538  NA 133* 132*  < > 137  K 4.5 4.5  < > 4.8  CL 88* 89*  < > 103  CO2 34* 32  < > 29  GLUCOSE 107* 134*  < > 105*  BUN 74* 82*  < > 23*  CREATININE 1.62* 1.87*  < > 0.61  CALCIUM 8.9 8.1*  < > 8.2*  MG  --  2.7*  --   --   AST 32  --   --   --   ALT 23  --   --   --   ALKPHOS 63  --   --   --   BILITOT 0.7  --   --   --   < > = values in this interval not displayed. ------------------------------------------------------------------------------------------------------------------  Cardiac Enzymes No results for input(s): TROPONINI in the last 168 hours. ------------------------------------------------------------------------------------------------------------------  RADIOLOGY:  Dg Abd 2 Views  Result Date: 03/13/2016 CLINICAL DATA:  Abdominal pain, small-bowel obstruction EXAM: ABDOMEN - 2 VIEW COMPARISON:  03/11/2016 FINDINGS: NG tube within the proximal stomach. No significant free air in the upright exam. Minor basilar atelectasis. Aortic  atherosclerosis noted. Similar gaseous distension of predominantly small bowel. There is air in the splenic flexure of the colon and the rectum. Findings are consistent with persistent partial obstruction versus ileus. Degenerative changes and osteopenia of the spine and pelvis. Postop changes in the pelvis. IMPRESSION: Minimal change in gas distention of the small bowel with air noted in the splenic flexure of the colon and the rectum. Appearance more compatible with persistent ileus versus partial obstruction. No free air. Electronically Signed   By: Judie Petit.  Shick M.D.   On: 03/13/2016 10:18   Dg Hip Unilat With Pelvis 2-3 Views Right  Result Date: 03/12/2016 CLINICAL DATA:  Fall, right hip pain EXAM: DG HIP (WITH OR WITHOUT PELVIS) 2-3V RIGHT COMPARISON:  None. FINDINGS: Three views of the right hip submitted. Study is limited by diffuse osteopenia. No gross fracture or subluxation. Degenerative changes bilateral SI joints. IMPRESSION: Limited study by diffuse osteopenia. No gross fracture or subluxation. Electronically Signed   By: Natasha Mead M.D.   On: 03/12/2016 15:42    EKG:   Orders placed or performed during the hospital encounter of 10/11/14  . EKG 12-Lead  . EKG 12-Lead  . EKG 12-Lead  . EKG 12-Lead  . EKG    ASSESSMENT AND PLAN:   [name]  is a 83 y.o. female with a known history of Nonischemic cardiomyopathy status post defibrillated, history of complete heart block status post pacemaker, COPD, degenerative disc disease, history of bowel obstructions, GERD, depression was independent from home presents to hospital secondary to abdominal pain and nausea vomiting.  #1  SBO-management per surgical team. -Due to high surgical risk, patient is being managed conservatively. Has had admissions for recurrent bowel obstructions. -on NG tube suction. Minimal output today, waiting surgical team decision to see if NG can come out today and start clears - KUB with ileus still but clinically  has improved and also passing flatus - currently NPO except ice chips  #2 Cardiomyopathy s/p AICD-stable at this time. No fluid overload. Hold losartan and Lasix. Continue digoxin- changed to IV -discontinue telemetry as stable  #3 ARF-likely from dehydration and prerenal causes. Gentle hydration and improvement noted.. -Avoid nephrotoxins  #4 GERD-or coffee ground emesis on suction, Protonix IV once a day. Change to PO once NG removed. -Hold NSAIDS  #5 Acute delirium- No dementia at baseline. Improved delirium now Monitor.  #6 DVT prophylaxis- on subcutaneous heparin  Updated patient and son at bedside Physical therapy consulted   All the records are reviewed and case discussed with Care Management/Social Workerr. Management plans discussed with the patient, family and they are in agreement.  CODE STATUS: DNR  TOTAL TIME TAKING CARE OF THIS PATIENT: 34 minutes.   POSSIBLE D/C IN 1-2 DAYS, DEPENDING ON CLINICAL CONDITION.   Enid Baas M.D on 03/13/2016 at 1:49 PM  Between 7am to 6pm - Pager - [phone]  After 6pm go to [email or web address] - Social research officer, government  [facility] [place] [facility]  Office  [phone]  CC: Primary care physician; Lynnea Ferrier, MD

## 2016-03-14 LAB — CBC
HCT: 28.6 % — ABNORMAL LOW (ref 35.0–47.0)
Hemoglobin: 9.7 g/dL — ABNORMAL LOW (ref 12.0–16.0)
MCH: 31.7 pg (ref 26.0–34.0)
MCHC: 33.8 g/dL (ref 32.0–36.0)
MCV: 93.7 fL (ref 80.0–100.0)
Platelets: 206 10*3/uL (ref 150–440)
RBC: 3.05 MIL/uL — AB (ref 3.80–5.20)
RDW: 16.4 % — ABNORMAL HIGH (ref 11.5–14.5)
WBC: 7.2 10*3/uL (ref 3.6–11.0)

## 2016-03-14 MED ORDER — DIGOXIN 125 MCG PO TABS
0.1250 mg | ORAL_TABLET | Freq: Every day | ORAL | Status: DC
  Administered 2016-03-15 – 2016-03-18 (×4): 0.125 mg via ORAL
  Filled 2016-03-14 (×4): qty 1

## 2016-03-14 MED ORDER — ASPIRIN 81 MG PO CHEW
81.0000 mg | CHEWABLE_TABLET | Freq: Every day | ORAL | Status: DC
  Administered 2016-03-14 – 2016-03-18 (×5): 81 mg via ORAL
  Filled 2016-03-14 (×5): qty 1

## 2016-03-14 MED ORDER — POLYETHYLENE GLYCOL 3350 17 G PO PACK
17.0000 g | PACK | Freq: Every day | ORAL | Status: DC
  Administered 2016-03-14 – 2016-03-18 (×5): 17 g via ORAL
  Filled 2016-03-14 (×5): qty 1

## 2016-03-14 MED ORDER — DIGOXIN 125 MCG PO TABS: 0.1250 mg | ORAL_TABLET | Freq: Every day | ORAL | Status: DC

## 2016-03-14 MED ORDER — PANTOPRAZOLE SODIUM 40 MG PO TBEC
40.0000 mg | DELAYED_RELEASE_TABLET | Freq: Every day | ORAL | Status: DC
Start: 2016-03-14 — End: 2016-03-18
  Administered 2016-03-15 – 2016-03-18 (×4): 40 mg via ORAL
  Filled 2016-03-14 (×5): qty 1

## 2016-03-14 MED ORDER — AMITRIPTYLINE HCL 75 MG PO TABS
75.0000 mg | ORAL_TABLET | Freq: Every day | ORAL | Status: DC
  Administered 2016-03-14 – 2016-03-17 (×4): 75 mg via ORAL
  Filled 2016-03-14 (×4): qty 1

## 2016-03-14 MED ORDER — SENNOSIDES-DOCUSATE SODIUM 8.6-50 MG PO TABS: 2.0000 | ORAL_TABLET | Freq: Every evening | ORAL | Status: DC | PRN

## 2016-03-14 MED ORDER — LOSARTAN POTASSIUM 25 MG PO TABS
25.0000 mg | ORAL_TABLET | Freq: Every day | ORAL | Status: DC
  Administered 2016-03-14 – 2016-03-18 (×5): 25 mg via ORAL
  Filled 2016-03-14 (×5): qty 1

## 2016-03-14 NOTE — Progress Notes (Signed)
[employer] - [place] at [facility]   PATIENT NAME: [name]    MR#:  [id number]  DATE OF BIRTH:  09-Dec-1927  SUBJECTIVE:  CHIEF COMPLAINT:   Chief Complaint  Patient presents with  . Constipation   - feels better, says she had a bowel movement yesterday - passing flatus and tolerating clears well  REVIEW OF SYSTEMS:  Review of Systems  Constitutional: Positive for malaise/fatigue. Negative for chills and fever.  HENT: Positive for hearing loss. Negative for congestion, ear discharge and nosebleeds.   Eyes: Negative for blurred vision and double vision.  Respiratory: Negative for cough, shortness of breath and wheezing.   Cardiovascular: Negative for chest pain, palpitations and leg swelling.  Gastrointestinal: Positive for abdominal pain and nausea. Negative for constipation, diarrhea and vomiting.  Genitourinary: Negative for dysuria.  Musculoskeletal: Positive for myalgias.  Neurological: Negative for dizziness, seizures and headaches.  Psychiatric/Behavioral: Negative for depression.    DRUG ALLERGIES:   Allergies  Allergen Reactions  . Toprol Xl [Metoprolol] Other (See Comments)    Reaction: skin pulls away from the bottom of feet  . Ace Inhibitors Cough  . Diltiazem Hcl Other (See Comments)    VITALS:  Blood pressure (!) 136/57, pulse 78, temperature 97.6 F (36.4 C), temperature source Oral, resp. rate 18, height 5\' 3"  (1.6 m), weight 75.3 kg (166 lb), SpO2 98 %.  PHYSICAL EXAMINATION:  Physical Exam  GENERAL:  83 y.o.-year-old patient sitting in the chair with no acute distress.  EYES: Pupils equal, round, reactive to light and accommodation. No scleral icterus. Extraocular muscles intact.  HEENT: Head atraumatic, normocephalic. Oropharynx and nasopharynx clear.  NECK:  Supple, no jugular venous distention. No thyroid enlargement, no tenderness.  LUNGS: Normal breath sounds bilaterally, no wheezing, rales,rhonchi or crepitation. No use  of accessory muscles of respiration. Decreased bibasilar breath sounds CARDIOVASCULAR: S1, S2 normal. No rubs, or gallops. 3/6 systolic murmur present. ABDOMEN: distended, ventral hernias present, diffuse tenderness all over, improved Bowel sounds. No organomegaly or mass.  EXTREMITIES: No pedal edema, cyanosis, or clubbing. Right hip bruise NEUROLOGIC: Cranial nerves II through XII are intact. Muscle strength 5/5 in all extremities. Sensation intact. Gait not checked. Global weakness noted. PSYCHIATRIC: The patient is alert and oriented x 3. Minimal confusion noted. SKIN: No obvious rash, lesion, or ulcer.   LABORATORY PANEL:   CBC  Recent Labs Lab 03/14/16 0429  WBC 7.2  HGB 9.7*  HCT 28.6*  PLT 206   ------------------------------------------------------------------------------------------------------------------  Chemistries   Recent Labs Lab 03/09/16 1617 03/10/16 0551  03/13/16 0538  NA 133* 132*  < > 137  K 4.5 4.5  < > 4.8  CL 88* 89*  < > 103  CO2 34* 32  < > 29  GLUCOSE 107* 134*  < > 105*  BUN 74* 82*  < > 23*  CREATININE 1.62* 1.87*  < > 0.61  CALCIUM 8.9 8.1*  < > 8.2*  MG  --  2.7*  --   --   AST 32  --   --   --   ALT 23  --   --   --   ALKPHOS 63  --   --   --   BILITOT 0.7  --   --   --   < > = values in this interval not displayed. ------------------------------------------------------------------------------------------------------------------  Cardiac Enzymes No results for input(s): TROPONINI in the last 168 hours. ------------------------------------------------------------------------------------------------------------------  RADIOLOGY:  Dg Abd 2 Views  Result  Date: 03/13/2016 CLINICAL DATA:  Abdominal pain, small-bowel obstruction EXAM: ABDOMEN - 2 VIEW COMPARISON:  03/11/2016 FINDINGS: NG tube within the proximal stomach. No significant free air in the upright exam. Minor basilar atelectasis. Aortic atherosclerosis noted. Similar gaseous  distension of predominantly small bowel. There is air in the splenic flexure of the colon and the rectum. Findings are consistent with persistent partial obstruction versus ileus. Degenerative changes and osteopenia of the spine and pelvis. Postop changes in the pelvis. IMPRESSION: Minimal change in gas distention of the small bowel with air noted in the splenic flexure of the colon and the rectum. Appearance more compatible with persistent ileus versus partial obstruction. No free air. Electronically Signed   By: Judie Petit.  Shick M.D.   On: 03/13/2016 10:18   Dg Hip Unilat With Pelvis 2-3 Views Right  Result Date: 03/12/2016 CLINICAL DATA:  Fall, right hip pain EXAM: DG HIP (WITH OR WITHOUT PELVIS) 2-3V RIGHT COMPARISON:  None. FINDINGS: Three views of the right hip submitted. Study is limited by diffuse osteopenia. No gross fracture or subluxation. Degenerative changes bilateral SI joints. IMPRESSION: Limited study by diffuse osteopenia. No gross fracture or subluxation. Electronically Signed   By: Natasha Mead M.D.   On: 03/12/2016 15:42    EKG:   Orders placed or performed during the hospital encounter of 10/11/14  . EKG 12-Lead  . EKG 12-Lead  . EKG 12-Lead  . EKG 12-Lead  . EKG    ASSESSMENT AND PLAN:   [name]  is a 83 y.o. female with a known history of Nonischemic cardiomyopathy status post defibrillated, history of complete heart block status post pacemaker, COPD, degenerative disc disease, history of bowel obstructions, GERD, depression was independent from home presents to hospital secondary to abdominal pain and nausea vomiting.  #1  SBO-management per surgical team. -Due to high surgical risk, patient is being managed conservatively. Has had admissions for recurrent bowel obstructions. -had  NG tube suction. Passing flatus, clinical improvement noted, NG tube d/ced last evening - started on clears today  #2 Cardiomyopathy s/p AICD-stable at this time. No fluid overload. Hold  losartan and Lasix. Continue digoxin  #3 ARF-likely from dehydration and prerenal causes. Gentle hydration and improvement noted.. -Avoid nephrotoxins  #4 GERD-or coffee ground emesis on suction, Protonix IV. Change to PO as NG removed. -Hold NSAIDS  #5 Acute delirium- No dementia at baseline. Improved delirium now Monitor. At baseline, alert and very conversational.  #6 DVT prophylaxis- on subcutaneous heparin  Physical therapy consulted Discharge to [facility] in 1-2 days   All the records are reviewed and case discussed with Care Management/Social Workerr. Management plans discussed with the patient, family and they are in agreement.  CODE STATUS: DNR  TOTAL TIME TAKING CARE OF THIS PATIENT: 34 minutes.   POSSIBLE D/C IN 1-2 DAYS, DEPENDING ON CLINICAL CONDITION.   Enid Baas M.D on 03/14/2016 at 10:56 AM  Between 7am to 6pm - Pager - [phone]  After 6pm go to [email or web address] - Social research officer, government  [facility] [place] [facility]  Office  [phone]  CC: Primary care physician; Lynnea Ferrier, MD

## 2016-03-14 NOTE — Progress Notes (Signed)
03/14/2016  Subjective: No acute events overnight. Patient reports she had flatus and BM last night.  Reports her abdomen is feeling better.  Vital signs: Temp:  [97.5 F (36.4 C)-97.9 F (36.6 C)] 97.6 F (36.4 C) (01/18 0815) Pulse Rate:  [75-110] 78 (01/18 0815) Resp:  [16-18] 18 (01/18 0437) BP: (136-149)/(57-76) 136/57 (01/18 0815) SpO2:  [92 %-100 %] 93 % (01/18 1118)   Intake/Output: 01/17 0701 - 01/18 0700 In: 680.9 [I.V.:680.9] Out: 400 [Urine:400] Last BM Date: 03/09/16  Physical Exam: Constitutional: no acute distress Abdomen: soft, less distended, minimal tender to palpation over left lower quadrant.  Ventral hernia reducible and non-tender.  Labs:   Recent Labs  03/14/16 0429  WBC 7.2  HGB 9.7*  HCT 28.6*  PLT 206    Recent Labs  03/12/16 0406 03/13/16 0538  NA 141 137  K 4.3 4.8  CL 99* 103  CO2 37* 29  GLUCOSE 103* 105*  BUN 52* 23*  CREATININE 0.87 0.61  CALCIUM 7.9* 8.2*   No results for input(s): LABPROT, INR in the last 72 hours.  Imaging: No results found.  Assessment/Plan: 83 yo female with SBO, improving.  --Start clear liquids today, full liquids for dinner --discontinue iv fluids today. --OOB, ambulate   Howie Ill, MD [facility]

## 2016-03-14 NOTE — Progress Notes (Signed)
Physical Therapy Treatment Patient Details Name: [name] MRN: [id number] DOB: 05-07-1927 Today's Date: 03/14/2016    History of Present Illness presented to ER with nausea/vomiting; admitted with small bowel obstruction, planning for conservative medical management at this time.  NGT in place to suction; NPO for now.  Of note, patient with recent hospitalization (1 week prior) seconday to fall with acute T7 compression fracture; TLSO to be worn with all OOB activities per previous notes.    PT Comments    Requesting to use bedside commode.  Mod a for all rolling and supine to/from sit.  Max a to don brace.  Min a  X 2 to transfer to commode. Pt with belching upon sitting.  Large amt of urine.  Returned to bed per her request to due abdominal pain and fatigue with transfer.     Follow Up Recommendations  SNF     Equipment Recommendations  None recommended by PT    Recommendations for Other Services       Precautions / Restrictions Precautions Precautions: Fall;Back Precaution Booklet Issued: No Precaution Comments: reviewed precautions - needed verbal cues to recall Required Braces or Orthoses: Spinal Brace Spinal Brace: Thoracolumbosacral orthotic Restrictions Weight Bearing Restrictions: No    Mobility  Bed Mobility Overal bed mobility: Needs Assistance Bed Mobility: Rolling Rolling: Mod assist Sidelying to sit: Mod assist       General bed mobility comments: Cues for log roll technique with increased time and effort as well as use of bed rail  Transfers Overall transfer level: Needs assistance Equipment used: Rolling walker (2 wheeled) Transfers: Sit to/from [employer] Transfers Sit to Stand: +2 physical assistance;Min assist         General transfer comment: unable to amb due to weakness  Ambulation/Gait Ambulation/Gait assistance: Min assist;+2 physical assistance Ambulation Distance (Feet): 2 Feet   Gait Pattern/deviations: Step-to  pattern         Stairs            Wheelchair Mobility    Modified Rankin (Stroke Patients Only)       Balance Overall balance assessment: Needs assistance;History of Falls Sitting-balance support: No upper extremity supported;Feet supported Sitting balance-Leahy Scale: Good     Standing balance support: Bilateral upper extremity supported;During functional activity Standing balance-Leahy Scale: Poor Standing balance comment: Requires UE support for static and dynamic activities                    Cognition Arousal/Alertness: Awake/alert Behavior During Therapy: WFL for tasks assessed/performed Overall Cognitive Status: Within Functional Limits for tasks assessed                      Exercises Other Exercises Other Exercises: to commode to urinate.      General Comments        Pertinent Vitals/Pain Pain Assessment: 0-10 Pain Score: 7  Pain Location: stomach Pain Intervention(s): Limited activity within patient's tolerance    Home Living                      Prior Function            PT Goals (current goals can now be found in the care plan section)      Frequency    Min 2X/week      PT Plan Current plan remains appropriate    Co-evaluation             End of  Session Equipment Utilized During Treatment: Gait belt;Back brace Activity Tolerance: Patient limited by pain;Patient limited by fatigue Patient left: in bed;with call bell/phone within reach;with bed alarm set     Time: 6256-3893 PT Time Calculation (min) (ACUTE ONLY): 19 min  Charges:  $Therapeutic Activity: 8-22 mins                    G Codes:      Danielle Dess 24-Mar-2016, 4:20 PM

## 2016-03-15 ENCOUNTER — Inpatient Hospital Stay: Payer: Medicare Other

## 2016-03-15 MED ORDER — HYDROCOD POLST-CPM POLST ER 10-8 MG/5ML PO SUER
5.0000 mL | Freq: Two times a day (BID) | ORAL | Status: DC
  Administered 2016-03-15 – 2016-03-18 (×7): 5 mL via ORAL
  Filled 2016-03-15 (×7): qty 5

## 2016-03-15 MED ORDER — BENZONATATE 100 MG PO CAPS
100.0000 mg | ORAL_CAPSULE | Freq: Three times a day (TID) | ORAL | Status: DC
  Administered 2016-03-15 – 2016-03-18 (×10): 100 mg via ORAL
  Filled 2016-03-15 (×10): qty 1

## 2016-03-15 NOTE — Plan of Care (Signed)
Problem: Safety: Goal: Ability to remain free from injury will improve Outcome: Progressing Bed alarm on   

## 2016-03-15 NOTE — Care Management Important Message (Signed)
Important Message  Patient Details  Name: [name] MRN: [id number] Date of Birth: [birth year]   Medicare Important Message Given:  Yes    Chapman Fitch, RN 03/15/2016, 3:43 PM

## 2016-03-15 NOTE — Progress Notes (Signed)
83 yr old female with multiple medical issues with SBO verses ileus.  Patient  Having increased coughing and not feeling very well. She has also had some confusion.  She has had some cramping abdominal pain but otherwise denies nausea or feeling full.  She had a BM yesterday and tolerated clear liquids   Vitals:   03/15/16 0402 03/15/16 0812  BP: 130/74 120/71  Pulse: 92 65  Resp: 17 15  Temp: 97.6 F (36.4 C) 98 F (36.7 C)   I/O last 3 completed shifts: In: 1465.9 [P.O.:600; I.V.:865.9] Out: 400 [Urine:400] Total I/O In: 180 [P.O.:180] Out: -    PE:  Gen: NAD Res: CTAB/L  Cardio: RRR Abd: soft, mild distension, minimal tenderness in LLQ Ext: no edema  CBC Latest Ref Rng & Units 03/14/2016 03/11/2016 03/10/2016  WBC 3.6 - 11.0 K/uL 7.2 8.9 7.2  Hemoglobin 12.0 - 16.0 g/dL 1.6(X) 0.9(U) 10.9(L)  Hematocrit 35.0 - 47.0 % 28.6(L) 28.2(L) 32.7(L)  Platelets 150 - 440 K/uL 206 208 227   CMP Latest Ref Rng & Units 03/13/2016 03/12/2016 03/11/2016  Glucose 65 - 99 mg/dL 045(W) 098(J) 191(Y)  BUN 6 - 20 mg/dL 78(G) 95(A) 21(H)  Creatinine 0.44 - 1.00 mg/dL 0.86 5.78 4.69(G)  Sodium 135 - 145 mmol/L 137 141 137  Potassium 3.5 - 5.1 mmol/L 4.8 4.3 3.6  Chloride 101 - 111 mmol/L 103 99(L) 94(L)  CO2 22 - 32 mmol/L 29 37(H) 35(H)  Calcium 8.9 - 10.3 mg/dL 8.2(L) 7.9(L) 8.2(L)  Total Protein 6.5 - 8.1 g/dL - - -  Total Bilirubin 0.3 - 1.2 mg/dL - - -  Alkaline Phos 38 - 126 U/L - - -  AST 15 - 41 U/L - - -  ALT 14 - 54 U/L - - -     A/P:  83 yr old female with multiple medical issues with SBO verses ileus and some confusion. Will advance to full liquids today, medicine evaluated as well, getting CXR today as well.

## 2016-03-15 NOTE — Progress Notes (Signed)
[employer] - [place] at [facility]   PATIENT NAME: [name]    MR#:  [id number]  DATE OF BIRTH:  1927/12/01  SUBJECTIVE:  CHIEF COMPLAINT:   Chief Complaint  Patient presents with  . Constipation   - complains of cough. -small BM last night - intermittent confusion  REVIEW OF SYSTEMS:  Review of Systems  Constitutional: Positive for malaise/fatigue. Negative for chills and fever.  HENT: Positive for hearing loss. Negative for congestion, ear discharge and nosebleeds.   Eyes: Negative for blurred vision and double vision.  Respiratory: Positive for cough. Negative for shortness of breath and wheezing.   Cardiovascular: Negative for chest pain, palpitations and leg swelling.  Gastrointestinal: Positive for abdominal pain and nausea. Negative for constipation, diarrhea and vomiting.  Genitourinary: Negative for dysuria.  Musculoskeletal: Positive for myalgias.  Neurological: Negative for dizziness, seizures and headaches.  Psychiatric/Behavioral: Negative for depression.    DRUG ALLERGIES:   Allergies  Allergen Reactions  . Toprol Xl [Metoprolol] Other (See Comments)    Reaction: skin pulls away from the bottom of feet  . Ace Inhibitors Cough  . Diltiazem Hcl Other (See Comments)    VITALS:  Blood pressure 120/71, pulse 65, temperature 98 F (36.7 C), temperature source Oral, resp. rate 15, height 5\' 3"  (1.6 m), weight 75.3 kg (166 lb), SpO2 96 %.  PHYSICAL EXAMINATION:  Physical Exam  GENERAL:  83 y.o.-year-old patient sitting in the chair with no acute distress.  EYES: Pupils equal, round, reactive to light and accommodation. No scleral icterus. Extraocular muscles intact.  HEENT: Head atraumatic, normocephalic. Oropharynx and nasopharynx clear.  NECK:  Supple, no jugular venous distention. No thyroid enlargement, no tenderness.  LUNGS: Normal breath sounds bilaterally, no wheezing, rales,rhonchi or crepitation. No use of accessory muscles of  respiration. Decreased bibasilar breath sounds CARDIOVASCULAR: S1, S2 normal. No rubs, or gallops. 3/6 systolic murmur present. ABDOMEN: distended, ventral hernias present, minimal tenderness all over, improved Bowel sounds. No organomegaly or mass.  EXTREMITIES: No pedal edema, cyanosis, or clubbing. Right hip bruise NEUROLOGIC: Cranial nerves II through XII are intact. Muscle strength 5/5 in all extremities. Sensation intact. Gait not checked. Global weakness noted. PSYCHIATRIC: The patient is alert and oriented x 3. Minimal confusion noted. SKIN: No obvious rash, lesion, or ulcer.   LABORATORY PANEL:   CBC  Recent Labs Lab 03/14/16 0429  WBC 7.2  HGB 9.7*  HCT 28.6*  PLT 206   ------------------------------------------------------------------------------------------------------------------  Chemistries   Recent Labs Lab 03/09/16 1617 03/10/16 0551  03/13/16 0538  NA 133* 132*  < > 137  K 4.5 4.5  < > 4.8  CL 88* 89*  < > 103  CO2 34* 32  < > 29  GLUCOSE 107* 134*  < > 105*  BUN 74* 82*  < > 23*  CREATININE 1.62* 1.87*  < > 0.61  CALCIUM 8.9 8.1*  < > 8.2*  MG  --  2.7*  --   --   AST 32  --   --   --   ALT 23  --   --   --   ALKPHOS 63  --   --   --   BILITOT 0.7  --   --   --   < > = values in this interval not displayed. ------------------------------------------------------------------------------------------------------------------  Cardiac Enzymes No results for input(s): TROPONINI in the last 168 hours. ------------------------------------------------------------------------------------------------------------------  RADIOLOGY:  No results found.  EKG:   Orders placed or performed  during the hospital encounter of 10/11/14  . EKG 12-Lead  . EKG 12-Lead  . EKG 12-Lead  . EKG 12-Lead  . EKG    ASSESSMENT AND PLAN:   Shyrl Guerrier  is a 83 y.o. female with a known history of Nonischemic cardiomyopathy status post defibrillated, history of complete  heart block status post pacemaker, COPD, degenerative disc disease, history of bowel obstructions, GERD, depression was independent from home presents to hospital secondary to abdominal pain and nausea vomiting.  #1  SBO-management per surgical team. -Due to high surgical risk, patient is being managed conservatively. Has had admissions for recurrent bowel obstructions. -had  NG tube suction. Passing flatus, clinical improvement noted, NG tube d/ced  - on clears, had small BM yesterday- plan to advance diet per surgery and anticipate discharge soon.  #2 Cardiomyopathy s/p AICD-stable at this time. No fluid overload. Hold losartan and Lasix. Continue digoxin  #3 ARF-likely from dehydration and prerenal causes. Gentle hydration and improvement noted.. -Avoid nephrotoxins  #4 GERD-or coffee ground emesis on suction, Protonix PO as NG removed. -Hold NSAIDS  #5 Acute delirium- No dementia at baseline. Improved delirium now, but has occasional confusion Monitor.   #6 DVT prophylaxis- on subcutaneous heparin  #7 Recent T7 compression fracture- back brace, pian meds, physical therapy  Physical therapy consulted Discharge to [facility] in 1-2 days  Medically no acute issues- WILL SIGN OFF. Please contact if any questions or concerns.   All the records are reviewed and case discussed with Care Management/Social Workerr. Management plans discussed with the patient, family and they are in agreement.  CODE STATUS: DNR  TOTAL TIME TAKING CARE OF THIS PATIENT: 34 minutes.   POSSIBLE D/C IN 1-2 DAYS, DEPENDING ON CLINICAL CONDITION.   Enid Baas M.D on 03/15/2016 at 12:39 PM  Between 7am to 6pm - Pager - [phone]  After 6pm go to [email or web address] - Social research officer, government  [facility] [place] [facility]  Office  [phone]  CC: Primary care physician; Lynnea Ferrier, MD

## 2016-03-16 LAB — URINALYSIS, COMPLETE (UACMP) WITH MICROSCOPIC
BILIRUBIN URINE: NEGATIVE
GLUCOSE, UA: NEGATIVE mg/dL
Ketones, ur: NEGATIVE mg/dL
NITRITE: POSITIVE — AB
PROTEIN: NEGATIVE mg/dL
RBC / HPF: NONE SEEN RBC/hpf (ref 0–5)
SPECIFIC GRAVITY, URINE: 1.013 (ref 1.005–1.030)
pH: 5 (ref 5.0–8.0)

## 2016-03-16 LAB — CBC WITH DIFFERENTIAL/PLATELET
Basophils Absolute: 0.1 10*3/uL (ref 0–0.1)
Basophils Relative: 1 %
EOS PCT: 1 %
Eosinophils Absolute: 0.1 10*3/uL (ref 0–0.7)
HEMATOCRIT: 26.9 % — AB (ref 35.0–47.0)
HEMOGLOBIN: 8.9 g/dL — AB (ref 12.0–16.0)
LYMPHS ABS: 0.6 10*3/uL — AB (ref 1.0–3.6)
Lymphocytes Relative: 8 %
MCH: 30.7 pg (ref 26.0–34.0)
MCHC: 33.1 g/dL (ref 32.0–36.0)
MCV: 92.7 fL (ref 80.0–100.0)
MONOS PCT: 17 %
Monocytes Absolute: 1.3 10*3/uL — ABNORMAL HIGH (ref 0.2–0.9)
NEUTROS ABS: 5.3 10*3/uL (ref 1.4–6.5)
Neutrophils Relative %: 73 %
Platelets: 235 10*3/uL (ref 150–440)
RBC: 2.9 MIL/uL — AB (ref 3.80–5.20)
RDW: 17 % — AB (ref 11.5–14.5)
WBC: 7.4 10*3/uL (ref 3.6–11.0)

## 2016-03-16 MED ORDER — CEFTRIAXONE SODIUM-DEXTROSE 1-3.74 GM-% IV SOLR
1.0000 g | INTRAVENOUS | Status: DC
  Administered 2016-03-16 – 2016-03-17 (×2): 1 g via INTRAVENOUS
  Filled 2016-03-16 (×3): qty 50

## 2016-03-16 MED ORDER — ACETAMINOPHEN 325 MG PO TABS
650.0000 mg | ORAL_TABLET | Freq: Four times a day (QID) | ORAL | Status: DC | PRN
  Administered 2016-03-16: 650 mg via ORAL
  Filled 2016-03-16: qty 2

## 2016-03-16 MED ORDER — DEXTROSE 5 % IV SOLN: 1.0000 g | INTRAVENOUS | Status: DC

## 2016-03-16 MED ORDER — KETOROLAC TROMETHAMINE 15 MG/ML IJ SOLN
15.0000 mg | Freq: Once | INTRAMUSCULAR | Status: AC
  Administered 2016-03-16: 15 mg via INTRAVENOUS
  Filled 2016-03-16: qty 1

## 2016-03-16 NOTE — Progress Notes (Signed)
Patient up to chair with 2+ assist. Back brace used. Pt tolerated well.  This RN noticed niece attempting to take picture of patient. This RN notified pt visiting niece about photography policy, acknowledged.  Pt eating dinner. Continue to assess.

## 2016-03-16 NOTE — Progress Notes (Signed)
83 yr old female with multiple medical issues with SBO verses ileus.  Fever to 102.2 today. Patient much more confused today.  She does answer but otherwise will just shake head, curled up in fetal position.  She denies complaints currently.   Vitals:   03/16/16 0521 03/16/16 0840  BP:  (!) 106/44  Pulse:  61  Resp:  18  Temp: 98.4 F (36.9 C) 98.1 F (36.7 C)   I/O last 3 completed shifts: In: 440 [P.O.:440] Out: -  Total I/O In: 480 [P.O.:480] Out: -    PE:  Gen: NAD Res: CTAB/L  Cardio: RRR Abd: soft, mild distension, minimal tenderness in LLQ Ext: no edema  CBC Latest Ref Rng & Units 03/14/2016 03/11/2016 03/10/2016  WBC 3.6 - 11.0 K/uL 7.2 8.9 7.2  Hemoglobin 12.0 - 16.0 g/dL 6.8(T) 1.5(B) 10.9(L)  Hematocrit 35.0 - 47.0 % 28.6(L) 28.2(L) 32.7(L)  Platelets 150 - 440 K/uL 206 208 227   CMP Latest Ref Rng & Units 03/13/2016 03/12/2016 03/11/2016  Glucose 65 - 99 mg/dL 262(M) 355(H) 741(U)  BUN 6 - 20 mg/dL 38(G) 53(M) 46(O)  Creatinine 0.44 - 1.00 mg/dL 0.32 1.22 4.82(N)  Sodium 135 - 145 mmol/L 137 141 137  Potassium 3.5 - 5.1 mmol/L 4.8 4.3 3.6  Chloride 101 - 111 mmol/L 103 99(L) 94(L)  CO2 22 - 32 mmol/L 29 37(H) 35(H)  Calcium 8.9 - 10.3 mg/dL 8.2(L) 7.9(L) 8.2(L)  Total Protein 6.5 - 8.1 g/dL - - -  Total Bilirubin 0.3 - 1.2 mg/dL - - -  Alkaline Phos 38 - 126 U/L - - -  AST 15 - 41 U/L - - -  ALT 14 - 54 U/L - - -     A/P:  83 yr old female with multiple medical issues with SBO verses ileus and some confusion. CBC, UA and cx ordered, appreciate medicine help, will give soft diet today

## 2016-03-16 NOTE — Progress Notes (Signed)
Pt has a temperature of 100 orally. Prime doctor notified of findings and pt currently has no PRN medications to help with fever. New orders to place tylenol 650mg  Q6 PRN for fever. Will reassess temperature.   Niema Carrara [employer]

## 2016-03-16 NOTE — Plan of Care (Signed)
Problem: Safety: Goal: Ability to remain free from injury will improve Outcome: Progressing Bed alarm has been placed throughout the night.   Problem: Pain Managment: Goal: General experience of comfort will improve Outcome: Progressing Pt has relief with PRN toradol

## 2016-03-16 NOTE — Progress Notes (Signed)
[employer] [facility] - [place] at [facility]   PATIENT NAME: [name]    MR#:  [id number]  DATE OF BIRTH:  19-Nov-1927  SUBJECTIVE:   - complains of cough. -small BM last night - intermittent confusion Patient spike one time fever of 102.4 last night. REVIEW OF SYSTEMS:  Review of Systems  Constitutional: Positive for malaise/fatigue. Negative for chills and fever.  HENT: Positive for hearing loss. Negative for congestion, ear discharge and nosebleeds.   Eyes: Negative for blurred vision and double vision.  Respiratory: Positive for cough. Negative for shortness of breath and wheezing.   Cardiovascular: Negative for chest pain, palpitations and leg swelling.  Gastrointestinal: Positive for abdominal pain and nausea. Negative for constipation, diarrhea and vomiting.  Genitourinary: Negative for dysuria.  Musculoskeletal: Positive for myalgias.  Neurological: Negative for dizziness, seizures and headaches.  Psychiatric/Behavioral: Negative for depression.    DRUG ALLERGIES:   Allergies  Allergen Reactions  . Toprol Xl [Metoprolol] Other (See Comments)    Reaction: skin pulls away from the bottom of feet  . Ace Inhibitors Cough  . Diltiazem Hcl Other (See Comments)    VITALS:  Blood pressure (!) 106/44, pulse 61, temperature 98.1 F (36.7 C), temperature source Oral, resp. rate 18, height 5\' 3"  (1.6 m), weight 75.3 kg (166 lb), SpO2 95 %.  PHYSICAL EXAMINATION:  Physical Exam  GENERAL:  83 y.o.-year-old patient sitting in the chair with no acute distress.  EYES: Pupils equal, round, reactive to light and accommodation. No scleral icterus. Extraocular muscles intact.  HEENT: Head atraumatic, normocephalic. Oropharynx and nasopharynx clear.  NECK:  Supple, no jugular venous distention. No thyroid enlargement, no tenderness.  LUNGS: Normal breath sounds bilaterally, no wheezing, rales,rhonchi or crepitation. No use of accessory muscles of respiration. Decreased  bibasilar breath sounds CARDIOVASCULAR: S1, S2 normal. No rubs, or gallops. 3/6 systolic murmur present. ABDOMEN: distended, ventral hernias present, minimal tenderness all over, improved Bowel sounds. No organomegaly or mass.  EXTREMITIES: No pedal edema, cyanosis, or clubbing. Right hip bruise NEUROLOGIC: Cranial nerves II through XII are intact. Muscle strength 5/5 in all extremities. Sensation intact. Gait not checked. Global weakness noted. PSYCHIATRIC: The patient is alert and oriented x 3. Minimal confusion noted. SKIN: No obvious rash, lesion, or ulcer.   LABORATORY PANEL:   CBC  Recent Labs Lab 03/16/16 1254  WBC 7.4  HGB 8.9*  HCT 26.9*  PLT 235   ------------------------------------------------------------------------------------------------------------------  Chemistries   Recent Labs Lab 03/09/16 1617 03/10/16 0551  03/13/16 0538  NA 133* 132*  < > 137  K 4.5 4.5  < > 4.8  CL 88* 89*  < > 103  CO2 34* 32  < > 29  GLUCOSE 107* 134*  < > 105*  BUN 74* 82*  < > 23*  CREATININE 1.62* 1.87*  < > 0.61  CALCIUM 8.9 8.1*  < > 8.2*  MG  --  2.7*  --   --   AST 32  --   --   --   ALT 23  --   --   --   ALKPHOS 63  --   --   --   BILITOT 0.7  --   --   --   < > = values in this interval not displayed. ------------------------------------------------------------------------------------------------------------------  Cardiac Enzymes No results for input(s): TROPONINI in the last 168 hours. ------------------------------------------------------------------------------------------------------------------  RADIOLOGY:  Dg Chest 2 View  Result Date: 03/15/2016 CLINICAL DATA:  Coughing congestion. EXAM: CHEST  2 VIEW COMPARISON:  03/11/2016 FINDINGS: Right-sided ICD remains in place. Enteric tube has been removed. Cardiomediastinal silhouette is unchanged. Cardiac silhouette is normal in size. Aortic atherosclerosis is again seen. There is chronic left upper lobe  scarring as well as mild chronic interstitial coarsening elsewhere bilaterally, greatest in the lung bases. Asymmetric bullous changes are noted in the left upper lobe. No definite acute airspace consolidation, edema, sizable pleural effusion, or pneumothorax is identified. There is a T7 compression fracture as described on recent CT. IMPRESSION: 1. No active cardiopulmonary disease. 2. T7 fracture as described on recent prior studies. 3. Aortic atherosclerosis. Electronically Signed   By: Sebastian Ache M.D.   On: 03/15/2016 16:59    EKG:   Orders placed or performed during the hospital encounter of 10/11/14  . EKG 12-Lead  . EKG 12-Lead  . EKG 12-Lead  . EKG 12-Lead  . EKG    ASSESSMENT AND PLAN:   [name]  is a 83 y.o. female with a known history of Nonischemic cardiomyopathy status post defibrillated, history of complete heart block status post pacemaker, COPD, degenerative disc disease, history of bowel obstructions, GERD, depression was independent from home presents to hospital secondary to abdominal pain and nausea vomiting.  #1  SBO-management per surgical team. -Due to high surgical risk, patient is being managed conservatively. Has had admissions for recurrent bowel obstructions. -had  NG tube suction. Passing flatus, clinical improvement noted, NG tube d/ced  - on clears, had small BM yesterday- plan to advance diet per surgery and anticipate discharge soon.  #2 Cardiomyopathy s/p AICD-stable at this time. No fluid overload. Hold losartan and Lasix. Continue digoxin  #3 ARF-likely from dehydration and prerenal causes. Gentle hydration and improvement noted.. -Avoid nephrotoxins  #4 GERD-or coffee ground emesis on suction, Protonix PO as NG removed. -Hold NSAIDS  #5 Acute delirium- No dementia at baseline. Improved delirium now, but has occasional confusion Monitor.   #6 DVT prophylaxis- on subcutaneous heparin  #7 Recent T7 compression fracture- back brace, pian  meds, physical therapy  #8 fever unknown origin -Chek UA, urine culture, CBC -Chest x-ray no acute abnormality noted -Monitor fever curve -I will hold off on antibiotic at present since no source of infection identified yet this was discussed with Dr. Orvis Brill  Physical therapy consulted Discharge to [facility] in 1-2 days  Medically no acute issues- WILL SIGN OFF. Please contact if any questions or concerns.   All the records are reviewed and case discussed with Care Management/Social Workerr. Management plans discussed with the patient, family and they are in agreement.  CODE STATUS: DNR  TOTAL TIME TAKING CARE OF THIS PATIENT: 34 minutes.   POSSIBLE D/C IN 1-2 DAYS, DEPENDING ON CLINICAL CONDITION.   [name] M.D on 03/16/2016 at 1:59 PM  Between 7am to 6pm - Pager - [phone]  After 6pm go to [email or web address] - Social research officer, government  [facility]  Office  [phone]  CC: Primary care physician; Taylor Ferrier, MD

## 2016-03-17 LAB — CREATININE, SERUM
CREATININE: 0.94 mg/dL (ref 0.44–1.00)
GFR calc Af Amer: >60 mL/min (ref >60)
GFR calc non Af Amer: 53 mL/min — ABNORMAL LOW (ref >60)

## 2016-03-17 MED ORDER — TRAMADOL HCL 50 MG PO TABS
50.0000 mg | ORAL_TABLET | Freq: Four times a day (QID) | ORAL | Status: DC | PRN
  Administered 2016-03-17 – 2016-03-18 (×5): 50 mg via ORAL
  Filled 2016-03-17 (×5): qty 1

## 2016-03-17 NOTE — Clinical Social Work Note (Addendum)
CSW contacted [facility] to confirm bed available. The weekend supervisor reported that the family chose not to hold the bed, and the bed is no longer available. CSW will work up for placement and is following. Dr. Orvis Brill is aware.  Argentina Ponder, MSW, Theresia Majors [phone]

## 2016-03-17 NOTE — NC FL2 (Signed)
[place] MEDICAID FL2 LEVEL OF CARE SCREENING TOOL     IDENTIFICATION  Patient Name: [name] Birthdate: 10-01-1927 Sex: female Admission Date (Current Location): 03/09/2016  [place] and [place] Number:  Chiropodist and Address:  [facility], [address] [place], [place] [address]      Provider Number: [id number]  Attending Physician Name and Address:  Henrene Dodge, MD  Relative Name and Phone Number:       Current Level of Care: Hospital Recommended Level of Care: Skilled Nursing Facility Prior Approval Number:    Date Approved/Denied:   PASRR Number: [id number]  Discharge Plan: SNF    Current Diagnoses: Patient Active Problem List   Diagnosis Date Noted  . Thoracic compression fracture, closed, initial encounter (HCC) 03/03/2016  . SBO (small bowel obstruction) 01/11/2016  . Small bowel obstruction 10/11/2014  . Intestinal obstruction due to adhesions 07/28/2014    Orientation RESPIRATION BLADDER Height & Weight     Self, Time, Situation, Place  Normal Continent Weight: 166 lb (75.3 kg) Height:  5\' 3"  (160 cm)  BEHAVIORAL SYMPTOMS/MOOD NEUROLOGICAL BOWEL NUTRITION STATUS      Continent Diet (Heart Healthy)  AMBULATORY STATUS COMMUNICATION OF NEEDS Skin   Extensive Assist Verbally Normal                       Personal Care Assistance Level of Assistance  Bathing, Dressing, Feeding Bathing Assistance: Limited assistance Feeding assistance: Independent Dressing Assistance: Limited assistance     Functional Limitations Info    Sight Info: Adequate Hearing Info: Adequate Speech Info: Adequate    SPECIAL CARE FACTORS FREQUENCY  PT (By licensed PT), OT (By licensed OT)     PT Frequency: Up to 5 X per day, 5 days per week OT Frequency: Up to 5 X per day, 5 days per week            Contractures Contractures Info: Present    Additional Factors Info  Code Status, Allergies Code Status Info:  DNR Allergies Info: Toprol Xl Metoprolol, Ace Inhibitors, Diltiazem Hcl           Current Medications (03/17/2016):  This is the current hospital active medication list Current Facility-Administered Medications  Medication Dose Route Frequency Provider Last Rate Last Dose  . acetaminophen (TYLENOL) tablet 650 mg  650 mg Oral Q6H PRN Ihor Austin, MD   650 mg at 03/16/16 0447  . albuterol (PROVENTIL) (2.5 MG/3ML) 0.083% nebulizer solution 2.5 mg  2.5 mg Nebulization Q4H PRN Henrene Dodge, MD      . amitriptyline (ELAVIL) tablet 75 mg  75 mg Oral QHS Henrene Dodge, MD   75 mg at 03/16/16 2111  . aspirin chewable tablet 81 mg  81 mg Oral Daily Henrene Dodge, MD   81 mg at 03/17/16 0944  . benzonatate (TESSALON) capsule 100 mg  100 mg Oral TID Enid Baas, MD   100 mg at 03/17/16 1141  . bisacodyl (DULCOLAX) suppository 10 mg  10 mg Rectal Daily Gladis Riffle, MD   10 mg at 03/17/16 0944  . cefTRIAXone (ROCEPHIN) IVPB 1 g  1 g Intravenous Q24H Gladis Riffle, MD   1 g at 03/16/16 1802  . chlorpheniramine-HYDROcodone (TUSSIONEX) 10-8 MG/5ML suspension 5 mL  5 mL Oral Q12H Enid Baas, MD   5 mL at 03/17/16 0945  . digoxin (LANOXIN) tablet 0.125 mg  0.125 mg Oral Daily Enid Baas, MD   0.125 mg at  03/17/16 0944  . enoxaparin (LOVENOX) injection 40 mg  40 mg Subcutaneous Q24H Enid Baas, MD   40 mg at 03/16/16 2015  . hydrALAZINE (APRESOLINE) injection 10 mg  10 mg Intravenous Q6H PRN Henrene Dodge, MD      . ketorolac (TORADOL) 15 MG/ML injection 15 mg  15 mg Intravenous Q6H PRN Gladis Riffle, MD   15 mg at 03/17/16 0944  . losartan (COZAAR) tablet 25 mg  25 mg Oral Daily Henrene Dodge, MD   25 mg at 03/17/16 0944  . MEDLINE mouth rinse  15 mL Mouth Rinse BID Henrene Dodge, MD   15 mL at 03/17/16 1000  . mometasone-formoterol (DULERA) 100-5 MCG/ACT inhaler 2 puff  2 puff Inhalation BID Henrene Dodge, MD   2 puff at 03/17/16 0954  . ondansetron (ZOFRAN-ODT)  disintegrating tablet 4 mg  4 mg Oral Q6H PRN Henrene Dodge, MD       Or  . ondansetron (ZOFRAN) injection 4 mg  4 mg Intravenous Q6H PRN Henrene Dodge, MD   4 mg at 03/14/16 2229  . pantoprazole (PROTONIX) EC tablet 40 mg  40 mg Oral Daily Enid Baas, MD   40 mg at 03/17/16 0945  . phenol (CHLORASEPTIC) mouth spray 1 spray  1 spray Mouth/Throat PRN Tiney Rouge III, MD   1 spray at 03/13/16 0753  . polyethylene glycol (MIRALAX / GLYCOLAX) packet 17 g  17 g Oral Daily Henrene Dodge, MD   17 g at 03/17/16 0945  . senna-docusate (Senokot-S) tablet 2 tablet  2 tablet Oral QHS PRN Henrene Dodge, MD      . tiotropium (SPIRIVA) inhalation capsule 18 mcg  18 mcg Inhalation Daily Henrene Dodge, MD   18 mcg at 03/17/16 1142  . traMADol (ULTRAM) tablet 50 mg  50 mg Oral Q6H PRN Enedina Finner, MD   50 mg at 03/17/16 1141     Discharge Medications: Please see discharge summary for a list of discharge medications.  Relevant Imaging Results:  Relevant Lab Results:   Additional Information SS# [id number]  Judi Cong, LCSW

## 2016-03-17 NOTE — Progress Notes (Signed)
Back to bed with 2 assist and back brace on.

## 2016-03-17 NOTE — Progress Notes (Signed)
[employer] - [place] at [facility]   PATIENT NAME: [name]    MR#:  [id number]  DATE OF BIRTH:  [birth year]  SUBJECTIVE:   Patient is out of the chair today. Complaining of pain around her brace area. No fever. REVIEW OF SYSTEMS:  Review of Systems  Constitutional: Positive for malaise/fatigue. Negative for chills and fever.  HENT: Positive for hearing loss. Negative for congestion, ear discharge and nosebleeds.   Eyes: Negative for blurred vision and double vision.  Respiratory: Positive for cough. Negative for shortness of breath and wheezing.   Cardiovascular: Negative for chest pain, palpitations and leg swelling.  Gastrointestinal: Positive for abdominal pain and nausea. Negative for constipation, diarrhea and vomiting.  Genitourinary: Negative for dysuria.  Musculoskeletal: Positive for myalgias.  Neurological: Negative for dizziness, seizures and headaches.  Psychiatric/Behavioral: Negative for depression.    DRUG ALLERGIES:   Allergies  Allergen Reactions  . Toprol Xl [Metoprolol] Other (See Comments)    Reaction: skin pulls away from the bottom of feet  . Ace Inhibitors Cough  . Diltiazem Hcl Other (See Comments)    VITALS:  Blood pressure 125/62, pulse 82, temperature 98 F (36.7 C), temperature source Oral, resp. rate 19, height 5\' 3"  (1.6 m), weight 75.3 kg (166 lb), SpO2 98 %.  PHYSICAL EXAMINATION:  Physical Exam  GENERAL:  83 y.o.-year-old patient sitting in the chair with no acute distress.  EYES: Pupils equal, round, reactive to light and accommodation. No scleral icterus. Extraocular muscles intact.  HEENT: Head atraumatic, normocephalic. Oropharynx and nasopharynx clear.  NECK:  Supple, no jugular venous distention. No thyroid enlargement, no tenderness.  LUNGS: Normal breath sounds bilaterally, no wheezing, rales,rhonchi or crepitation. No use of accessory muscles of respiration. Decreased bibasilar breath  sounds CARDIOVASCULAR: S1, S2 normal. No rubs, or gallops. 3/6 systolic murmur present. ABDOMEN: distended, ventral hernias present, minimal tenderness all over, improved Bowel sounds. No organomegaly or mass.  EXTREMITIES: No pedal edema, cyanosis, or clubbing. Right hip bruise NEUROLOGIC: Cranial nerves II through XII are intact. Muscle strength 5/5 in all extremities. Sensation intact. Gait not checked. Global weakness noted. PSYCHIATRIC: The patient is alert and oriented x 3. Minimal confusion noted. SKIN: No obvious rash, lesion, or ulcer.   LABORATORY PANEL:   CBC  Recent Labs Lab 03/16/16 1254  WBC 7.4  HGB 8.9*  HCT 26.9*  PLT 235   ------------------------------------------------------------------------------------------------------------------  Chemistries   Recent Labs Lab 03/13/16 0538 03/17/16 0525  NA 137  --   K 4.8  --   CL 103  --   CO2 29  --   GLUCOSE 105*  --   BUN 23*  --   CREATININE 0.61 0.94  CALCIUM 8.2*  --    ------------------------------------------------------------------------------------------------------------------  Cardiac Enzymes No results for input(s): TROPONINI in the last 168 hours. ------------------------------------------------------------------------------------------------------------------  RADIOLOGY:  Dg Chest 2 View  Result Date: 03/15/2016 CLINICAL DATA:  Coughing congestion. EXAM: CHEST  2 VIEW COMPARISON:  03/11/2016 FINDINGS: Right-sided ICD remains in place. Enteric tube has been removed. Cardiomediastinal silhouette is unchanged. Cardiac silhouette is normal in size. Aortic atherosclerosis is again seen. There is chronic left upper lobe scarring as well as mild chronic interstitial coarsening elsewhere bilaterally, greatest in the lung bases. Asymmetric bullous changes are noted in the left upper lobe. No definite acute airspace consolidation, edema, sizable pleural effusion, or pneumothorax is identified. There is a  T7 compression fracture as described on recent CT. IMPRESSION: 1. No active cardiopulmonary disease. 2.  T7 fracture as described on recent prior studies. 3. Aortic atherosclerosis. Electronically Signed   By: Sebastian Ache M.D.   On: 03/15/2016 16:59    EKG:   Orders placed or performed during the hospital encounter of 10/11/14  . EKG 12-Lead  . EKG 12-Lead  . EKG 12-Lead  . EKG 12-Lead  . EKG    ASSESSMENT AND PLAN:   Vennela Shreiner  is a 83 y.o. female with a known history of Nonischemic cardiomyopathy status post defibrillated, history of complete heart block status post pacemaker, COPD, degenerative disc disease, history of bowel obstructions, GERD, depression was independent from home presents to hospital secondary to abdominal pain and nausea vomiting.  #1  SBO-management per surgical team. -Due to high surgical risk, patient is being managed conservatively. Has had admissions for recurrent bowel obstructions. -had  NG tube suction. Passing flatus, clinical improvement noted, NG tube d/ced  - on clears, had small BM yesterday- plan to advance diet per surgery and anticipate discharge soon.  #2 Cardiomyopathy s/p AICD-stable at this time. No fluid overload. Hold losartan and Lasix. Continue digoxin  #3 ARF-likely from dehydration and prerenal causes. Gentle hydration and improvement noted.. -Avoid nephrotoxins  #4 GERD-or coffee ground emesis on suction, Protonix PO as NG removed. -Hold NSAIDS  #5 Acute delirium- No dementia at baseline. Improved delirium now, but has occasional confusion Monitor.   #6 DVT prophylaxis- on subcutaneous heparin  #7 Recent T7 compression fracture- back brace, pian meds, physical therapy  #8 fever Due to UTI.- -WBC count normal. Urine culture pending. Foley removed a few days ago -Chest x-ray no acute abnormality noted -IV Rocephin. Depending on urine culture changed to appropriate antibiotic orally at discharge for total of 7  days  Physical therapy consulted Discharge to [facility]  Medicine will sign off. Please call if needed.  All the records are reviewed and case discussed with Care Management/Social Workerr. Management plans discussed with the patient, family and they are in agreement.  CODE STATUS: DNR  TOTAL TIME TAKING CARE OF THIS PATIENT: 30 minutes.   POSSIBLE D/C IN 1-2 DAYS, DEPENDING ON CLINICAL CONDITION.   Nikkolas Coomes M.D on 03/17/2016 at 1:27 PM  Between 7am to 6pm - Pager - [phone]  After 6pm go to [email or web address] - Social research officer, government  [facility]  Office  [phone]  CC: Primary care physician; Lynnea Ferrier, MD

## 2016-03-17 NOTE — Clinical Social Work Note (Signed)
Clinical Social Work Assessment  Patient Details  Name: [name] MRN: [id number] Date of Birth: [birth year]  Date of referral:  03/17/16               Reason for consult:  Facility Placement                Permission sought to share information with:  Facility Industrial/product designer granted to share information::  Yes, Verbal Permission Granted  Name::        Agency::     Relationship::     Contact Information:     Housing/Transportation Living arrangements for the past 2 months:  Skilled Nursing Facility Source of Information:  Adult Children Patient Interpreter Needed:  None Criminal Activity/Legal Involvement Pertinent to Current Situation/Hospitalization:  No - Comment as needed Significant Relationships:  Adult Children Lives with:  Facility Resident Do you feel safe going back to the place where you live?  Yes Need for family participation in patient care:  No (Coment)  Care giving concerns:  Admitted from [facility]   Social Worker assessment / plan:  CSW spoke with the patient's son, Jeannett Senior, about dc planning. The family chose not to place a bed hold at [facility], and they are aware that the patient may not be able to return if no beds are available. Jeannett Senior reported that his next choice is Rec[facility] or [facility]. The patient's family would like her transported at dc via non-emergent EMS.   Patient will most likely dc 03/18/2016.  Employment status:  Retired Health and safety inspector:  Medicare PT Recommendations:  Skilled Nursing Facility Information / Referral to community resources:  Skilled Nursing Facility  Patient/Family's Response to care:  Jeannett Senior thanked CSW for assistance and information.  Patient/Family's Understanding of and Emotional Response to Diagnosis, Current Treatment, and Prognosis:  Patient's family understand that the patient may not be able to return to [facility].  Emotional Assessment Appearance:  Appears stated  age Attitude/Demeanor/Rapport:   (Pleasant) Affect (typically observed):  Pleasant, Appropriate, Accepting (Pleasant) Orientation:  Oriented to Self, Oriented to Place, Oriented to  Time Alcohol / Substance use:  Never Used Psych involvement (Current and /or in the community):  No (Comment)  Discharge Needs  Concerns to be addressed:  Care Coordination Readmission within the last 30 days:  Yes Current discharge risk:  None Barriers to Discharge:  Continued Medical Work up   [employer], LCSW 03/17/2016, 2:21 PM

## 2016-03-17 NOTE — Progress Notes (Signed)
83 yr old female with multiple medical issues with SBO verses ileus, found to have UTI on UA. Patient doing much better up and talking in chair.  She feels well.  SHe has been eating nad having BMs.    Vitals:   03/17/16 0445 03/17/16 0900  BP: 134/63 125/62  Pulse: 80 82  Resp: (!) 22 19  Temp: 98.1 F (36.7 C) 98 F (36.7 C)   I/O last 3 completed shifts: In: 1090 [P.O.:1040; IV Piggyback:50] Out: -  Total I/O In: 360 [P.O.:360] Out: -    PE:  Gen: NAD Res: CTAB/L  Cardio: RRR Abd: soft, mild distension, minimal tenderness in LLQ Ext: no edema  CBC Latest Ref Rng & Units 03/16/2016 03/14/2016 03/11/2016  WBC 3.6 - 11.0 K/uL 7.4 7.2 8.9  Hemoglobin 12.0 - 16.0 g/dL 4.6(F) 6.8(L) 2.7(N)  Hematocrit 35.0 - 47.0 % 26.9(L) 28.6(L) 28.2(L)  Platelets 150 - 440 K/uL 235 206 208   CMP Latest Ref Rng & Units 03/17/2016 03/13/2016 03/12/2016  Glucose 65 - 99 mg/dL - 170(Y) 174(B)  BUN 6 - 20 mg/dL - 44(H) 67(R)  Creatinine 0.44 - 1.00 mg/dL 9.16 3.84 6.65  Sodium 135 - 145 mmol/L - 137 141  Potassium 3.5 - 5.1 mmol/L - 4.8 4.3  Chloride 101 - 111 mmol/L - 103 99(L)  CO2 22 - 32 mmol/L - 29 37(H)  Calcium 8.9 - 10.3 mg/dL - 8.2(L) 7.9(L)  Total Protein 6.5 - 8.1 g/dL - - -  Total Bilirubin 0.3 - 1.2 mg/dL - - -  Alkaline Phos 38 - 126 U/L - - -  AST 15 - 41 U/L - - -  ALT 14 - 54 U/L - - -     A/P:  83 yr old female with multiple medical issues with ileus after having T7 fracture on 1/7, came in from [facility], ileus resolved but with confusion and fever on 1/20, found to have UTI, now on Rocephin and improved.  Ok to discharge back to facility but in discussion with SW she did not have a bed hold and all the paperwork will need to be refiled.  She likely can d/c to [facility] tomorrow. No sugical issues.

## 2016-03-18 LAB — URINE CULTURE: Culture: >=100000 — AB

## 2016-03-18 MED ORDER — CEPHALEXIN 500 MG PO CAPS: 500.0000 mg | ORAL_CAPSULE | Freq: Two times a day (BID) | ORAL | 0 refills | Status: AC

## 2016-03-18 NOTE — Care Management Important Message (Signed)
Important Message  Patient Details  Name: [name] MRN: [id number] Date of Birth: 1928-01-13   Medicare Important Message Given:  Yes    Chapman Fitch, RN 03/18/2016, 1:38 PM

## 2016-03-18 NOTE — Progress Notes (Signed)
CC: Bowel obstruction Subjective: 83 year old female admitted for a bowel obstruction. Also Urinary Tract Infection. Has Been Tolerating a Diet and Having Bowel Function. Complains of Some Left Lower Rib Discomfort This Morning. But States the Discomfort Comes and Goes and Removed.  Objective: Vital signs in last 24 hours: Temp:  [97.9 F (36.6 C)-98.5 F (36.9 C)] 98 F (36.7 C) (01/22 0815) Pulse Rate:  [82-86] 86 (01/22 0815) Resp:  [17-20] 17 (01/22 0815) BP: (110-148)/(66-82) 148/82 (01/22 0815) SpO2:  [94 %-97 %] 97 % (01/22 0815) Last BM Date: 03/15/16  Intake/Output from previous day: 01/21 0701 - 01/22 0700 In: 600 [P.O.:600] Out: -  Intake/Output this shift: No intake/output data recorded.  Physical exam:  Gen.: No acute distress Chest: Clear to auscultation Heart: Regular rhythm Abdomen: Soft, nontender, nondistended  Lab Results: CBC   Recent Labs  03/16/16 1254  WBC 7.4  HGB 8.9*  HCT 26.9*  PLT 235   BMET  Recent Labs  03/17/16 0525  CREATININE 0.94   PT/INR No results for input(s): LABPROT, INR in the last 72 hours. ABG No results for input(s): PHART, HCO3 in the last 72 hours.  Invalid input(s): PCO2, PO2  Studies/Results: No results found.  Anti-infectives: Anti-infectives    Start     Dose/Rate Route Frequency Ordered Stop   03/16/16 1800  cefTRIAXone (ROCEPHIN) IVPB 1 g    Comments:  UTI   1 g 100 mL/hr over 30 Minutes Intravenous Every 24 hours 03/16/16 1704     03/16/16 1700  cefTRIAXone (ROCEPHIN) 1 g in dextrose 5 % 50 mL IVPB  Status:  Discontinued     1 g 100 mL/hr over 30 Minutes Intravenous Every 24 hours 03/16/16 1657 03/16/16 1700      Assessment/Plan:  83 year old female admitted for small bowel traction. Currently being treated for urinary tract infection. Tolerating a diet and is much improved. Should be safe to return to her assisted living later today.  Shirah Roseman T. Tonita Cong, MD, FACS  03/18/2016

## 2016-03-18 NOTE — Discharge Summary (Addendum)
Patient ID: [name] MRN: [id number] DOB/AGE: February 01, 1928 83 y.o.  Admit date: 03/09/2016 Discharge date: 03/18/2016  Discharge Diagnoses:  Small bowel obstruction, urinary tract infection  Procedures Performed: None  Discharged Condition: good  Hospital Course: Patient admitted to the hospital with a small bowel obstruction. Bowel obstruction gradually resolved with return of normal appetite and bowel function. Patient was then found to have a urinary tract infection during hospitalization that was treated with antibiotics. The time of discharge she was tolerating regular diet and having normal bowel function for her.  Discharge Orders: Discharge Instructions    Diet - low sodium heart healthy    Complete by:  As directed    Increase activity slowly    Complete by:  As directed       Disposition: 03-Skilled Nursing Facility  Discharge Medications: Allergies as of 03/18/2016      Reactions   Toprol Xl [metoprolol] Other (See Comments)   Reaction: skin pulls away from the bottom of feet   Ace Inhibitors Cough   Diltiazem Hcl Other (See Comments)      Medication List    TAKE these medications   albuterol 108 (90 Base) MCG/ACT inhaler Commonly known as:  PROVENTIL HFA;VENTOLIN HFA Inhale 2 puffs into the lungs 2 (two) times daily.   amitriptyline 75 MG tablet Commonly known as:  ELAVIL Take 75 mg by mouth at bedtime.   aspirin 81 MG chewable tablet Chew 81 mg by mouth daily.   celecoxib 200 MG capsule Commonly known as:  CELEBREX Take 200 mg by mouth 2 (two) times daily.   cephALEXin 500 MG capsule Commonly known as:  KEFLEX Take 1 capsule (500 mg total) by mouth 2 (two) times daily.   cyclobenzaprine 5 MG tablet Commonly known as:  FLEXERIL Take 1 tablet (5 mg total) by mouth 3 (three) times daily as needed for muscle spasms.   digoxin 0.125 MG tablet Commonly known as:  LANOXIN Take 0.125 mg by mouth daily.   Fluticasone-Salmeterol 100-50 MCG/DOSE  Aepb Commonly known as:  ADVAIR Inhale 1 puff into the lungs 2 (two) times daily.   furosemide 20 MG tablet Commonly known as:  LASIX Take 60 mg by mouth daily.   ibuprofen 400 MG tablet Commonly known as:  ADVIL,MOTRIN Take 1 tablet (400 mg total) by mouth every 6 (six) hours as needed.   losartan 25 MG tablet Commonly known as:  COZAAR Take 25 mg by mouth daily.   omeprazole 20 MG capsule Commonly known as:  PRILOSEC Take 20 mg by mouth daily.   oxyCODONE 5 MG immediate release tablet Commonly known as:  Oxy IR/ROXICODONE Take 1 tablet (5 mg total) by mouth every 12 (twelve) hours as needed for moderate pain or severe pain.   pantoprazole 20 MG tablet Commonly known as:  PROTONIX Take 20 mg by mouth at bedtime.   polyethylene glycol packet Commonly known as:  MIRALAX / GLYCOLAX Take 17 g by mouth daily.   senna-docusate 8.6-50 MG tablet Commonly known as:  Senokot-S Take 2 tablets by mouth at bedtime as needed for mild constipation.   tiotropium 18 MCG inhalation capsule Commonly known as:  SPIRIVA Place 18 mcg into inhaler and inhale daily.         Follwup: Follow-up Information    BERT Maryjean Ka III, MD. Schedule an appointment as soon as possible for a visit in 1 week(s).   Specialty:  Internal Medicine Contact information: [address] [facility] [place]  [place] [address] [phone]           Signed: [name] 03/18/2016, 1:32 PM

## 2016-03-18 NOTE — Progress Notes (Signed)
Alert and oriented with forgetfulness. Vital signs stable . No signs of acute distress. Discharge instructions given. Patient verbalized understanding. No other issues noted at this time.

## 2016-03-18 NOTE — Discharge Instructions (Signed)
Small Bowel Obstruction °A small bowel obstruction means that something is blocking the small bowel. The small bowel is also called the small intestine. It is the long tube that connects the stomach to the colon. An obstruction will stop food and fluids from passing through the small bowel. Treatment depends on what is causing the problem and how bad the problem is. °Follow these instructions at home: °· Get a lot of rest. °· Follow your diet as told by your doctor. You may need to: °¨ Only drink clear liquids until you start to get better. °¨ Avoid solid foods as told by your doctor. °· Take over-the-counter and prescription medicines only as told by your doctor. °· Keep all follow-up visits as told by your doctor. This is important. °Contact a doctor if: °· You have a fever. °· You have chills. °Get help right away if: °· You have pain or cramps that get worse. °· You throw up (vomit) blood. °· You have a feeling of being sick to your stomach (nausea) that does not go away. °· You cannot stop throwing up. °· You cannot drink fluids. °· You feel confused. °· You feel dry or thirsty (dehydrated). °· Your belly gets more bloated. °· You feel weak or you pass out (faint). °This information is not intended to replace advice given to you by your health care provider. Make sure you discuss any questions you have with your health care provider. °Document Released: 03/21/2004 Document Revised: 10/09/2015 Document Reviewed: 04/07/2014 °Elsevier Interactive Patient Education © 2017 Elsevier Inc. ° °

## 2016-03-21 DIAGNOSIS — K56600 Partial intestinal obstruction, unspecified as to cause: Secondary | ICD-10-CM

## 2016-03-21 DIAGNOSIS — F39 Unspecified mood [affective] disorder: Secondary | ICD-10-CM | POA: Diagnosis not present

## 2016-03-21 DIAGNOSIS — I480 Paroxysmal atrial fibrillation: Secondary | ICD-10-CM

## 2016-03-21 DIAGNOSIS — S22068K Other fracture of T7-T8 thoracic vertebra, subsequent encounter for fracture with nonunion: Secondary | ICD-10-CM | POA: Diagnosis not present

## 2016-03-21 DIAGNOSIS — J438 Other emphysema: Secondary | ICD-10-CM

## 2016-03-21 DIAGNOSIS — I5032 Chronic diastolic (congestive) heart failure: Secondary | ICD-10-CM

## 2016-03-26 ENCOUNTER — Encounter: Payer: Self-pay | Admitting: Emergency Medicine

## 2016-03-26 ENCOUNTER — Observation Stay
Admission: EM | Admit: 2016-03-26 | Discharge: 2016-03-28 | Disposition: E | Payer: Medicare Other | Attending: Specialist | Admitting: Specialist

## 2016-03-26 ENCOUNTER — Emergency Department: Payer: Medicare Other

## 2016-03-26 ENCOUNTER — Telehealth: Payer: Self-pay

## 2016-03-26 DIAGNOSIS — K219 Gastro-esophageal reflux disease without esophagitis: Secondary | ICD-10-CM | POA: Diagnosis not present

## 2016-03-26 DIAGNOSIS — R0902 Hypoxemia: Secondary | ICD-10-CM

## 2016-03-26 DIAGNOSIS — Z87891 Personal history of nicotine dependence: Secondary | ICD-10-CM | POA: Insufficient documentation

## 2016-03-26 DIAGNOSIS — S22069A Unspecified fracture of T7-T8 vertebra, initial encounter for closed fracture: Secondary | ICD-10-CM | POA: Diagnosis not present

## 2016-03-26 DIAGNOSIS — F0390 Unspecified dementia without behavioral disturbance: Secondary | ICD-10-CM | POA: Insufficient documentation

## 2016-03-26 DIAGNOSIS — J439 Emphysema, unspecified: Secondary | ICD-10-CM | POA: Insufficient documentation

## 2016-03-26 DIAGNOSIS — Z7982 Long term (current) use of aspirin: Secondary | ICD-10-CM | POA: Diagnosis not present

## 2016-03-26 DIAGNOSIS — Z950 Presence of cardiac pacemaker: Secondary | ICD-10-CM | POA: Diagnosis not present

## 2016-03-26 DIAGNOSIS — R0602 Shortness of breath: Secondary | ICD-10-CM | POA: Diagnosis not present

## 2016-03-26 DIAGNOSIS — Z791 Long term (current) use of non-steroidal anti-inflammatories (NSAID): Secondary | ICD-10-CM | POA: Diagnosis not present

## 2016-03-26 DIAGNOSIS — W19XXXA Unspecified fall, initial encounter: Secondary | ICD-10-CM | POA: Insufficient documentation

## 2016-03-26 DIAGNOSIS — Z515 Encounter for palliative care: Secondary | ICD-10-CM | POA: Diagnosis not present

## 2016-03-26 DIAGNOSIS — Z66 Do not resuscitate: Secondary | ICD-10-CM | POA: Diagnosis not present

## 2016-03-26 DIAGNOSIS — N390 Urinary tract infection, site not specified: Secondary | ICD-10-CM | POA: Diagnosis not present

## 2016-03-26 DIAGNOSIS — F419 Anxiety disorder, unspecified: Secondary | ICD-10-CM | POA: Insufficient documentation

## 2016-03-26 DIAGNOSIS — J441 Chronic obstructive pulmonary disease with (acute) exacerbation: Secondary | ICD-10-CM

## 2016-03-26 DIAGNOSIS — I482 Chronic atrial fibrillation: Secondary | ICD-10-CM | POA: Insufficient documentation

## 2016-03-26 DIAGNOSIS — I429 Cardiomyopathy, unspecified: Secondary | ICD-10-CM | POA: Insufficient documentation

## 2016-03-26 DIAGNOSIS — J9601 Acute respiratory failure with hypoxia: Secondary | ICD-10-CM | POA: Diagnosis not present

## 2016-03-26 DIAGNOSIS — I509 Heart failure, unspecified: Secondary | ICD-10-CM | POA: Insufficient documentation

## 2016-03-26 LAB — CBC
HCT: 27.8 % — ABNORMAL LOW (ref 35.0–47.0)
Hemoglobin: 9.2 g/dL — ABNORMAL LOW (ref 12.0–16.0)
MCH: 30.5 pg (ref 26.0–34.0)
MCHC: 33.1 g/dL (ref 32.0–36.0)
MCV: 92.3 fL (ref 80.0–100.0)
PLATELETS: 223 10*3/uL (ref 150–440)
RBC: 3.01 MIL/uL — ABNORMAL LOW (ref 3.80–5.20)
RDW: 16.8 % — AB (ref 11.5–14.5)
WBC: 14.4 10*3/uL — AB (ref 3.6–11.0)

## 2016-03-26 LAB — COMPREHENSIVE METABOLIC PANEL
ALT: 13 U/L — ABNORMAL LOW (ref 14–54)
ANION GAP: 6 (ref 5–15)
AST: 22 U/L (ref 15–41)
Albumin: 2.8 g/dL — ABNORMAL LOW (ref 3.5–5.0)
Alkaline Phosphatase: 89 U/L (ref 38–126)
BUN: 24 mg/dL — ABNORMAL HIGH (ref 6–20)
CHLORIDE: 99 mmol/L — AB (ref 101–111)
CO2: 27 mmol/L (ref 22–32)
Calcium: 8.1 mg/dL — ABNORMAL LOW (ref 8.9–10.3)
Creatinine, Ser: 0.89 mg/dL (ref 0.44–1.00)
GFR, EST NON AFRICAN AMERICAN: 57 mL/min — AB (ref >60)
Glucose, Bld: 148 mg/dL — ABNORMAL HIGH (ref 65–99)
Potassium: 3.8 mmol/L (ref 3.5–5.1)
SODIUM: 132 mmol/L — AB (ref 135–145)
Total Bilirubin: 0.4 mg/dL (ref 0.3–1.2)
Total Protein: 7 g/dL (ref 6.5–8.1)

## 2016-03-26 LAB — BLOOD GAS, VENOUS
Acid-Base Excess: 4.7 mmol/L — ABNORMAL HIGH (ref 0.0–2.0)
Bicarbonate: 29.8 mmol/L — ABNORMAL HIGH (ref 20.0–28.0)
O2 Saturation: 85.8 %
PATIENT TEMPERATURE: 37
PCO2 VEN: 46 mmHg (ref 44.0–60.0)
PH VEN: 7.42 (ref 7.250–7.430)
PO2 VEN: 50 mmHg — AB (ref 32.0–45.0)

## 2016-03-26 LAB — INFLUENZA PANEL BY PCR (TYPE A & B)
INFLAPCR: NEGATIVE
INFLBPCR: NEGATIVE

## 2016-03-26 LAB — BRAIN NATRIURETIC PEPTIDE: B NATRIURETIC PEPTIDE 5: 143 pg/mL — AB (ref 0.0–100.0)

## 2016-03-26 LAB — TROPONIN I: TROPONIN I: 0.03 ng/mL — AB (ref <0.03)

## 2016-03-26 LAB — TSH: TSH: 0.755 u[IU]/mL (ref 0.350–4.500)

## 2016-03-26 LAB — MRSA PCR SCREENING: MRSA by PCR: NEGATIVE

## 2016-03-26 MED ORDER — DIGOXIN 250 MCG PO TABS: 0.1250 mg | ORAL_TABLET | Freq: Every day | ORAL | Status: DC

## 2016-03-26 MED ORDER — SENNOSIDES-DOCUSATE SODIUM 8.6-50 MG PO TABS: 2.0000 | ORAL_TABLET | Freq: Every evening | ORAL | Status: DC | PRN

## 2016-03-26 MED ORDER — ALBUTEROL SULFATE (2.5 MG/3ML) 0.083% IN NEBU: 2.5000 mg | INHALATION_SOLUTION | RESPIRATORY_TRACT | Status: DC | PRN

## 2016-03-26 MED ORDER — LORAZEPAM 1 MG PO TABS: 1.0000 mg | ORAL_TABLET | ORAL | Status: DC | PRN

## 2016-03-26 MED ORDER — MORPHINE SULFATE (PF) 4 MG/ML IV SOLN: 5.0000 mg | INTRAVENOUS | Status: DC | PRN

## 2016-03-26 MED ORDER — PANTOPRAZOLE SODIUM 40 MG PO TBEC: 40.0000 mg | DELAYED_RELEASE_TABLET | Freq: Every day | ORAL | Status: DC

## 2016-03-26 MED ORDER — DEXTROSE 5 % IV SOLN: 1.0000 g | INTRAVENOUS | Status: DC

## 2016-03-26 MED ORDER — LORAZEPAM 0.5 MG PO TABS
ORAL_TABLET | ORAL | Status: AC
  Administered 2016-03-26: 0.5 mg via ORAL
  Filled 2016-03-26: qty 1

## 2016-03-26 MED ORDER — CEFTRIAXONE SODIUM-DEXTROSE 1-3.74 GM-% IV SOLR: 1.0000 g | INTRAVENOUS | Status: DC

## 2016-03-26 MED ORDER — DOCUSATE SODIUM 100 MG PO CAPS: 100.0000 mg | ORAL_CAPSULE | Freq: Two times a day (BID) | ORAL | Status: DC

## 2016-03-26 MED ORDER — OSELTAMIVIR PHOSPHATE 75 MG PO CAPS: 75.0000 mg | ORAL_CAPSULE | Freq: Every day | ORAL | Status: DC

## 2016-03-26 MED ORDER — GLYCOPYRROLATE 0.2 MG/ML IJ SOLN
0.4000 mg | Freq: Four times a day (QID) | INTRAMUSCULAR | Status: DC
  Administered 2016-03-26: 12:00:00 0.4 mg via INTRAVENOUS
  Filled 2016-03-26: qty 2

## 2016-03-26 MED ORDER — TIOTROPIUM BROMIDE MONOHYDRATE 18 MCG IN CAPS
18.0000 ug | ORAL_CAPSULE | Freq: Every day | RESPIRATORY_TRACT | Status: DC
  Filled 2016-03-26: qty 5

## 2016-03-26 MED ORDER — MIDAZOLAM BOLUS VIA INFUSION
2.0000 mg | INTRAVENOUS | Status: DC | PRN
  Filled 2016-03-26: qty 2

## 2016-03-26 MED ORDER — ASPIRIN 81 MG PO CHEW: 81.0000 mg | CHEWABLE_TABLET | Freq: Every day | ORAL | Status: DC

## 2016-03-26 MED ORDER — SODIUM CHLORIDE 0.9 % IV SOLN
2.0000 mg/h | INTRAVENOUS | Status: DC
  Administered 2016-03-26: 12:00:00 2 mg/h via INTRAVENOUS
  Filled 2016-03-26: qty 10

## 2016-03-26 MED ORDER — ACETAMINOPHEN 325 MG PO TABS: 650.0000 mg | ORAL_TABLET | Freq: Four times a day (QID) | ORAL | Status: DC | PRN

## 2016-03-26 MED ORDER — IPRATROPIUM-ALBUTEROL 0.5-2.5 (3) MG/3ML IN SOLN
3.0000 mL | Freq: Once | RESPIRATORY_TRACT | Status: AC
  Administered 2016-03-26: 3 mL via RESPIRATORY_TRACT
  Filled 2016-03-26: qty 3

## 2016-03-26 MED ORDER — FUROSEMIDE 20 MG PO TABS: 20.0000 mg | ORAL_TABLET | Freq: Every day | ORAL | Status: DC

## 2016-03-26 MED ORDER — LORAZEPAM 2 MG/ML IJ SOLN: 1.0000 mg | INTRAMUSCULAR | Status: DC | PRN

## 2016-03-26 MED ORDER — SODIUM CHLORIDE 0.9 % IV SOLN
5.0000 mg/h | INTRAVENOUS | Status: DC
  Administered 2016-03-26: 2 mg/h via INTRAVENOUS
  Filled 2016-03-26: qty 10

## 2016-03-26 MED ORDER — LORAZEPAM 2 MG/ML PO CONC: 1.0000 mg | ORAL | Status: DC | PRN

## 2016-03-26 MED ORDER — FUROSEMIDE 10 MG/ML IJ SOLN
40.0000 mg | Freq: Once | INTRAMUSCULAR | Status: AC
  Administered 2016-03-26: 40 mg via INTRAVENOUS
  Filled 2016-03-26: qty 4

## 2016-03-26 MED ORDER — AMITRIPTYLINE HCL 25 MG PO TABS: 75.0000 mg | ORAL_TABLET | Freq: Every day | ORAL | Status: DC

## 2016-03-26 MED ORDER — ACETAMINOPHEN 650 MG RE SUPP: 650.0000 mg | Freq: Four times a day (QID) | RECTAL | Status: DC | PRN

## 2016-03-26 MED ORDER — MORPHINE SULFATE (PF) 2 MG/ML IV SOLN
2.0000 mg | Freq: Once | INTRAVENOUS | Status: AC
  Administered 2016-03-26: 2 mg via INTRAVENOUS
  Filled 2016-03-26: qty 1

## 2016-03-26 MED ORDER — MORPHINE SULFATE (PF) 4 MG/ML IV SOLN
4.0000 mg | Freq: Once | INTRAVENOUS | Status: AC
  Administered 2016-03-26: 4 mg via INTRAVENOUS
  Filled 2016-03-26: qty 1

## 2016-03-26 MED ORDER — LORAZEPAM 0.5 MG PO TABS
0.5000 mg | ORAL_TABLET | Freq: Once | ORAL | Status: AC
  Administered 2016-03-26: 0.5 mg via ORAL

## 2016-03-26 MED ORDER — MORPHINE SULFATE (PF) 2 MG/ML IV SOLN
1.0000 mg | INTRAVENOUS | Status: DC | PRN
  Administered 2016-03-26: 1 mg via INTRAVENOUS
  Filled 2016-03-26: qty 1

## 2016-03-26 MED ORDER — ONDANSETRON HCL 4 MG PO TABS: 4.0000 mg | ORAL_TABLET | Freq: Four times a day (QID) | ORAL | Status: DC | PRN

## 2016-03-26 MED ORDER — OXYCODONE HCL 5 MG PO TABS: 5.0000 mg | ORAL_TABLET | Freq: Two times a day (BID) | ORAL | Status: DC | PRN

## 2016-03-26 MED ORDER — MOMETASONE FURO-FORMOTEROL FUM 100-5 MCG/ACT IN AERO
2.0000 | INHALATION_SPRAY | Freq: Two times a day (BID) | RESPIRATORY_TRACT | Status: DC
  Filled 2016-03-26: qty 8.8

## 2016-03-26 MED ORDER — ONDANSETRON HCL 4 MG/2ML IJ SOLN: 4.0000 mg | Freq: Four times a day (QID) | INTRAMUSCULAR | Status: DC | PRN

## 2016-03-28 NOTE — ED Notes (Signed)
Delice Bison in the lab states "the platelets need to be reviewed and I can't get to it right now...that's all it's lacking". CBC results will be resulted soon

## 2016-03-28 NOTE — ED Notes (Signed)
[name] called from the lab, states needs recollect for CBC because they spun the BNP and weren't able to also run CBC.

## 2016-03-28 NOTE — Clinical Social Work Placement (Addendum)
   CLINICAL SOCIAL WORK PLACEMENT  NOTE  Date:  04/13/16  Patient Details  Name: [name] MRN: [id number] Date of Birth: 1928/02/05  Clinical Social Work is seeking post-discharge placement for this patient at the Skilled  Nursing Facility level of care (*CSW will initial, date and re-position this form in  chart as items are completed):  Yes   Patient/family provided with [facility] Clinical Social Work Department's list of facilities offering this level of care within the geographic area requested by the patient (or if unable, by the patient's family).  Yes   Patient/family informed of their freedom to choose among providers that offer the needed level of care, that participate in Medicare, Medicaid or managed care program needed by the patient, have an available bed and are willing to accept the patient.  Yes   Patient/family informed of [facility]'s ownership interest in [facility] and [facility], as well as of the fact that they are under no obligation to receive care at these facilities.  PASRR submitted to EDS on       PASRR number received on       Existing PASRR number confirmed on 2016/04/13     FL2 transmitted to all facilities in geographic area requested by pt/family on Apr 13, 2016  Patient informed that his/her managed care company has contracts with or will negotiate with certain facilities, including the following:            Patient/family informed of bed offers received.  Patient chooses bed at       Physician recommends and patient chooses bed at      Patient to be transferred to   on  .  Patient to be transferred to facility by       Patient family notified on   of transfer.  Name of family member notified:        PHYSICIAN       Additional Comment:    _______________________________________________ Ralene Bathe, Student-Social Work April 13, 2016, 10:06 AM

## 2016-03-28 NOTE — Consult Note (Signed)
Consultation Note Date: 03/31/16   Patient Name: [name]  DOB: [birth year]  MRN: [id number]  Age / Sex: 83 y.o., female  PCP: Lynnea Ferrier, MD Referring Physician: Auburn Bilberry, MD  Reason for Consultation: Establishing goals of care, Non pain symptom management, Pain control, Terminal Care and Withdrawal of life-sustaining treatment in light of respiratory failure, COPD, CHF, and A. Fib.  HPI/Patient Profile: 83 y.o. female  with past medical history of CHF, A. Fib, dementia, and emphysema who was admitted on 2016/03/31 with shortness of breath.  She also complained of  pain in her back and hips from compression fractures diagnosed after a fall a few months ago.  Her two sons and healthcare advocates accompanied her and provided history, as [name]'s general disability limits her communication ability.  Her son described his mother as being progressively "air hungry" in the past day or so.  She was admitted with the understanding that her sons want comfort care measures only.  She was started on a morphine drip with supplemental O2 through nasal canula.   Clinical Assessment and Goals of Care: We enter [name]'s room to find her sleeping in bed, surrounded by her sons, Jeannett Abbott and Onalee Hua, and cousin, Larene Beach.  She continues to sleep throughout our visit and exam.  Jeannett Abbott reiterates their wishes for comfort care, and explains that they would like her pacemaker turned off to aid in peaceful passing; it is his understanding that his mother is "100% reliant on her pacemaker."  We explain that a representative could come and disengage the pacemaker after we have had time to get [name] as comfortable as possible in preparation.  We explained that, despite Stephen's understanding of her heart's reliance on the pacemaker, we cannot say how long it will take for her to pass once it is turned off.  The  family was very appreciative of our involvement in the care of [name].  After our meeting, we ordered a Versed drip and [employer] representative assistance.   Primary Decision Maker:  NEXT OF KIN;  Sons Brett Canales and Onalee Hua    SUMMARY OF RECOMMENDATIONS    Code Status/Advance Care Planning:  DNR   Symptom Management:   Back/hip pain:  Continue morphine drip  Dyspnea/anxiety:  Started versed drip  Oral secretions:  Started robinol  Additional Recommendations (Limitations, Scope, Preferences):  Full Comfort Care  Psycho-social/Spiritual:   Desire for further Chaplaincy support: family has minister they would prefer to use  Prognosis:   Hours - Days   Discharge Planning: Anticipated Hospital Death, likely today or tomorrow      Primary Diagnoses: Present on Admission: . SOB (shortness of breath)   I have reviewed the medical record, interviewed the patient and family, and examined the patient. The following aspects are pertinent.  Past Medical History:  Diagnosis Date  . AICD (automatic cardioverter/defibrillator) present   . Anemia   . Arthritis   . Asthma   . Atrial fibrillation (HCC)    elected not to take  coumadin  . Bacteremia 2007   asystole pacemaker failed to capture due to lead wire  . Bowel obstruction    multiple  . Cardiomyopathy (HCC) 1990  . CHF (congestive heart failure) (HCC)   . COPD (chronic obstructive pulmonary disease) (HCC)   . Diverticulosis   . Emphysema of lung (HCC)   . GERD (gastroesophageal reflux disease)   . History of IBS   . Multiple gastric ulcers    in recent years  . Presence of permanent cardiac pacemaker   . Restless leg syndrome   . Shortness of breath dyspnea    with exertion  . 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 Ulcerative colitis (HCC)    2007   Social History   Social History  . Marital status: Widowed    Spouse name: N/A  . Number of children: N/A  . Years of education: N/A     Social History Main Topics  . Smoking status: Former Smoker    Packs/day: 1.00    Types: Cigarettes    Quit date: 03/28/2005  . Smokeless tobacco: Never Used  . Alcohol use Yes     Comment: occ  . Drug use: No  . Sexual activity: No   Other Topics Concern  . None   Social History Narrative  . None   Family History  Problem Relation Age of Onset  . Cancer Mother     Uterine  . Diabetes Father   . Heart disease Father    Scheduled Meds: . amitriptyline  75 mg Oral QHS  . aspirin  81 mg Oral Daily  . cefTRIAXone  1 g Intravenous Q24H  . digoxin  0.125 mg Oral Daily  . docusate sodium  100 mg Oral BID  . furosemide  20 mg Oral Daily  . glycopyrrolate  0.4 mg Intravenous QID  . mometasone-formoterol  2 puff Inhalation BID  . oseltamivir  75 mg Oral Daily  . pantoprazole  40 mg Oral Daily  . tiotropium  18 mcg Inhalation Daily   Continuous Infusions: . midazolam (VERSED) infusion 2 mg/hr (04-18-2016 1130)  . morphine 2 mg/hr (04/18/2016 0800)   PRN Meds:.acetaminophen **OR** acetaminophen, albuterol, LORazepam **OR** LORazepam **OR** LORazepam, midazolam, morphine injection, ondansetron **OR** ondansetron (ZOFRAN) IV, oxyCODONE, senna-docusate Allergies  Allergen Reactions  . Toprol Xl [Metoprolol] Other (See Comments)    Reaction: skin pulls away from the bottom of feet  . Ace Inhibitors Cough  . Diltiazem Hcl Other (See Comments)   Review of Systems   Physical Exam  Constitutional: She appears well-nourished. No distress.  HENT:  Head: Normocephalic and atraumatic.  Cardiovascular: Normal rate and regular rhythm.   Pulmonary/Chest:  Respiratory effort normal, audible tracheal secretions  Abdominal: Soft.  Skin: Skin is warm and dry. She is not diaphoretic. There is pallor.     Vital Signs: BP (!) 78/46   Pulse 87   Temp 98.7 F (37.1 C)   Resp (!) 22   Ht 5\' 3"  (1.6 m)   Wt 75.3 kg (166 lb)   SpO2 100%   BMI 29.41 kg/m          SpO2: SpO2: 100  % O2 Device:SpO2: 100 % O2 Flow Rate: .O2 Flow Rate (L/min): 4 L/min  IO: Intake/output summary: No intake or output data in the 24 hours ending 04-18-2016 1134  LBM:   Baseline Weight: Weight: 75.3 kg (166 lb) Most recent weight: Weight: 75.3 kg (166 lb)  Palliative Assessment/Data:   Flowsheet Rows   Flowsheet Row Most Recent Value  Intake Tab  Referral Department  Hospitalist  Unit at Time of Referral  ER  Palliative Care Primary Diagnosis  Pulmonary  Date Notified  April 08, 2016  Palliative Care Type  New Palliative care  Reason for referral  End of Life Care Assistance, Clarify Goals of Care  Date of Admission  2016/04/08  Date first seen by Palliative Care  Apr 08, 2016  # of days Palliative referral response time  0 Day(s)  # of days IP prior to Palliative referral  0  Clinical Assessment  Palliative Performance Scale Score  10%  Psychosocial & Spiritual Assessment  Palliative Care Outcomes  Patient/Family meeting held?  Yes  Who was at the meeting?  Patient's 2 sons and cousin  Palliative Care Outcomes  Provided end of life care assistance, Improved pain interventions  Palliative Care follow-up planned  No      Time In: 11:00 Time Out: 12:15 Time Total: 75 minutes Greater than 50%  of this time was spent counseling and coordinating care related to the above assessment and plan.  Signed by: Octavia Heir, PA-S Algis Downs, PA-C Palliative Medicine Pager: [phone]   Please contact Palliative Medicine Team phone at [phone] for questions and concerns.  For individual provider: See Loretha Stapler

## 2016-03-28 NOTE — Telephone Encounter (Signed)
PLEASE NOTE: All timestamps contained within this report are represented as [place] Standard Time. CONFIDENTIALTY NOTICE: This fax transmission is intended only for the addressee. It contains information that is legally privileged, confidential or otherwise protected from use or disclosure. If you are not the intended recipient, you are strictly prohibited from reviewing, disclosing, copying using or disseminating any of this information or taking any action in reliance on or regarding this information. If you have received this fax in error, please notify us immediately by telephone so that we can arrange for its return to [place]. Phone: [phone], Toll-Free: [phone], Fax: [phone] Page: 1 of 1 Call Id: [id number] [facility] Night - Client TELEPHONE ADVICE RECORD [facility] Patient Name: [name] Gender: Female DOB: [birth year] Age: 83 Y 8 M 19 D Return Phone Number: City/State/Zip: [place] Client [facility] Night - Client Client Site [facility] - Night Physician Tillman Abide - MD Who Is Calling Patient / Member / Family / Caregiver Call Type Triage / Clinical Caller Name Hilda Lias Relationship To Patient Provider Return Phone Number Please choose phone number Chief Complaint BREATHING - shortness of breath or sounds breathless Reason for Call Symptomatic / Request for Health Information Initial Comment [name] is from [facility] at [phone] and patient has 85-86 o2 and congestion. No Triage Reason Other Nurse Assessment Nurse: Trudie Reed, RN, [place] Date/Time Lamount Cohen Time): 22-Apr-2016 12:40:21 AM Confirm and document reason for call. If symptomatic, describe symptoms. ---Caller is from [facility] at [phone] and patient has 85-86 o2 and congestion.coughing constantly. on 2L o2. Select reason for no triage. ---Other Please document clinical information provided and list any resource  used. ---Caller just wants to speak with oncall regarding a verbal order to increase o2 and obtain a cxr Guidelines Guideline Title Affirmed Question Disp. Time Lamount Cohen Time) Disposition Final User April 22, 2016 12:46:08 AM Clinical Call Yes Trudie Reed, RN, [facility] Paging DoctorName Phone DateTime Action Result/Outcome Notes Cheryll Cockayne - MD [phone] 04-22-2016 12:44:51 AM Doctor Paged Called On Call Provider - Sherie Don - MD 04/22/16 12:45:12 AM Message Result Spoke with On Call - General warm transfer to the LPN at the facility

## 2016-03-28 NOTE — ED Notes (Signed)
This RN hand-off morphine mix bag, unspiked, unadministered to [place], RN on 1C-RN for 102 where patient is being admitted.

## 2016-03-28 NOTE — Progress Notes (Addendum)
Pt prounounced expired by me and  Lupita Leash heslep rn  md notified. Pa mairanne  [place]  Saw pt. Angus  Donor  Notified  And declined pt. Versed and morphine drip d/cd.   Family wishes  Luan Pulling and thompson in [place].  Ac jackie notified. Report to sarah klenner rn [employer] declined pt.  Spoke with laura  beirne case number [phone]

## 2016-03-28 NOTE — ED Triage Notes (Signed)
Per [facility]: Pt. Sent from [facility] d/t low O2 sats at 0030. Pt home O2 2LNC. Increased to 3L, O2 sat 94%. Son declined to send for eval. Pt. Son decided to send when WOB consistent. A febrile with EMS. VS WNL. 4LO2NC 99%. Course crackles present all 4lobes

## 2016-03-28 NOTE — Clinical Social Work Note (Signed)
Clinical Social Work Assessment  Patient Details  Name: [name] MRN: [id number] Date of Birth: [birth year]  Date of referral:  Mar 31, 2016               Reason for consult:  Facility Placement, Discharge Planning                Permission sought to share information with:  Chartered certified accountant granted to share information::  Yes, Verbal Permission Granted  Name::      [place]::   [place]   Relationship::     Contact Information:     Housing/Transportation Living arrangements for the past 2 months:  [place] ([facility] for short-term rehab ) Source of Information:  Adult Children Patient Interpreter Needed:  None Criminal Activity/Legal Involvement Pertinent to Current Situation/Hospitalization:  No - Comment as needed Significant Relationships:  Adult Children, Other Family Members Lives with:  Facility Resident [facility]) Do you feel safe going back to the place where you live?  Yes Need for family participation in patient care:  Yes (Comment)  Care giving concerns:  Patient is a current short-term rehab patient at [facility].   Social Worker assessment / plan:  Social work Theatre manager received social work consult. PT has not worked with patient at this time. Social work met with patient and patient's two sons Richardson Landry and Shanon Brow and patient's niece Vicky at bedside. Social work Theatre manager introduced herself and explained roles of social work department. Patient was asleep. Per patient's son Richardson Landry, patient is a short-term rehab patient at [facility]. Patient's son explained that patient is on a pace maker at 100% and there are talks of taking patient off of it.  Both of patient's sons want palliative care or hospice to get involved. There is a palliative care consult. Social work Theatre manager explained that patient can go back to [facility] with palliative following, but can not go back to rehab with Hospice. Patient's son  verbally agreed they understood. Patient's sons were okay with patient went back to [facility] with palliative if palliative is the best option of their mom. Social work Theatre manager will continue to assist and follow as needed. Patient's sons Richardson Landry and Shanon Brow are patient's HPOA.  Fl2 completed and faxed out.    Employment status:  Unemployed Forensic scientist:  Medicare PT Recommendations:  Not assessed at this time Information / Referral to community resources:  [place]  Patient/Family's Response to care:  Patient's family would like to talk to palliative about patient's care. Palliative consult is in.   Patient/Family's Understanding of and Emotional Response to Diagnosis, Current Treatment, and Prognosis:  Patient's family was pleasant and thanked social work Theatre manager for coming by.   Emotional Assessment Appearance:  Appears stated age Attitude/Demeanor/Rapport:    Affect (typically observed):  Accepting, Adaptable, Appropriate Orientation:  Oriented to Self Alcohol / Substance use:  Not Applicable Psych involvement (Current and /or in the community):  No (Comment)  Discharge Needs  Concerns to be addressed:  Basic Needs, Discharge Planning Concerns Readmission within the last 30 days:  No Current discharge risk:  Chronically ill Barriers to Discharge:  Continued Medical Work up   [employer], [place] Work 2016-03-31, 9:55 AM

## 2016-03-28 NOTE — Telephone Encounter (Signed)
Per chart review tab pt was seen ED and admitted to [facility] Apr 03, 2016.

## 2016-03-28 NOTE — ED Notes (Addendum)
Pt. Family and MD Zenda Alpers had discussion and family has decided to transition patient to comfort care.

## 2016-03-28 NOTE — Progress Notes (Signed)
Called by the case manager at the request of the family.  Family wants to know when the pace maker will be turned off.   Discussed with [employer] rep. The rep's company has a policy that they do not turn pace makers off at end of life as the pace maker will not prolong life once the heart muscle has died.  Rep was willing to guide me thru the steps of turning the pace maker off.   I discussed this with the family.  Son Brett Canales is an nurse and is willing to turn the pace maker off himself with the guidance of the [employer] rep.  I will be present to support the family thru the loss of their mother as well as possible.   Algis Downs, [place] [facility] Pager: [phone]

## 2016-03-28 NOTE — H&P (Signed)
[name] is an 83 y.o. female.   Chief Complaint: Shortness of breath HPI: The patient with past medical history of CHF, atrial fibrillation and emphysema presents emergency department due to shortness of breath. The patient also complains of pain in her back and her hips. She is barely verbal secondary to generalized debility but her sons are with her who are her caregivers with whom she communicated when she was somewhat stronger. Her son who is a nurse states the patient has appeared air hungry in the recently. Her appetite is also decreased and she is barely verbal at this point. Her sons desire for their mother to receive palliative care.  Past Medical History:  Diagnosis Date  . AICD (automatic cardioverter/defibrillator) present   . Anemia   . Arthritis   . Asthma   . Atrial fibrillation ([place])    elected not to take coumadin  . Bacteremia 2007   asystole pacemaker failed to capture due to lead wire  . Bowel obstruction    multiple  . Cardiomyopathy ([place]) 1990  . CHF (congestive heart failure) ([place])   . COPD (chronic obstructive pulmonary disease) ([place])   . Diverticulosis   . Emphysema of lung ([place])   . GERD (gastroesophageal reflux disease)   . History of IBS   . Multiple gastric ulcers    in recent years  . Presence of permanent cardiac pacemaker   . Restless leg syndrome   . Shortness of breath dyspnea    with exertion  . 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 Ulcerative colitis ([place])    2007    Past Surgical History:  Procedure Laterality Date  . ABDOMINAL HYSTERECTOMY  1990  . APPENDECTOMY  1990  . CARDIAC CATHETERIZATION    . CHOLECYSTECTOMY    . COLON SURGERY    . COLOSTOMY Left 1992  . COLOSTOMY TAKEDOWN  1993  . EYE SURGERY Bilateral    Cataract Extraction with IOL  . IMPLANTABLE CARDIOVERTER DEFIBRILLATOR (ICD) GENERATOR CHANGE N/A 05/26/2015   Procedure: ICD GENERATOR CHANGE;  Surgeon: Marzetta Board, MD;  Location: [facility];   Service: Cardiovascular;  Laterality: N/A;  . PACEMAKER INSERTION  2003  . VENTRICULAR CARDIAC PACEMAKER INSERTION  2007   biventricular pacer with defib implanted [id number] concerto [id number] serial [id number]    Family History  Problem Relation Age of Onset  . Cancer Mother     Uterine  . Diabetes Father   . Heart disease Father    Social History:  reports that she quit smoking about 11 years ago. Her smoking use included Cigarettes. She smoked 1.00 pack per day. She has never used smokeless tobacco. She reports that she drinks alcohol. She reports that she does not use drugs.  Allergies:  Allergies  Allergen Reactions  . Toprol Xl [Metoprolol] Other (See Comments)    Reaction: skin pulls away from the bottom of feet  . Ace Inhibitors Cough  . Diltiazem Hcl Other (See Comments)    Medications Prior to Admission  Medication Sig Dispense Refill  . albuterol (PROVENTIL HFA;VENTOLIN HFA) 108 (90 BASE) MCG/ACT inhaler Inhale 2 puffs into the lungs 2 (two) times daily.    Marland Kitchen amitriptyline (ELAVIL) 75 MG tablet Take 75 mg by mouth at bedtime.    Marland Kitchen aspirin 81 MG chewable tablet Chew 81 mg by mouth daily.    . celecoxib (CELEBREX) 200 MG capsule Take 200 mg by mouth daily.     . digoxin (  LANOXIN) 0.125 MG tablet Take 0.125 mg by mouth daily.    . Fluticasone-Salmeterol (ADVAIR) 100-50 MCG/DOSE AEPB Inhale 1 puff into the lungs 2 (two) times daily.    . furosemide (LASIX) 20 MG tablet Take 20 mg by mouth daily.     Marland Kitchen ibuprofen (ADVIL,MOTRIN) 400 MG tablet Take 1 tablet (400 mg total) by mouth every 6 (six) hours as needed. 30 tablet 0  . losartan (COZAAR) 25 MG tablet Take 25 mg by mouth daily.    Marland Kitchen omeprazole (PRILOSEC) 20 MG capsule Take 20 mg by mouth daily.     Marland Kitchen oseltamivir (TAMIFLU) 75 MG capsule Take 75 mg by mouth daily.    Marland Kitchen oxyCODONE (OXY IR/ROXICODONE) 5 MG immediate release tablet Take 1 tablet (5 mg total) by mouth every 12 (twelve) hours as needed for moderate pain or severe pain.  6 tablet 0  . polyethylene glycol (MIRALAX / GLYCOLAX) packet Take 17 g by mouth daily.    Marland Kitchen senna-docusate (SENOKOT-S) 8.6-50 MG tablet Take 2 tablets by mouth at bedtime as needed for mild constipation. 30 tablet 0  . tiotropium (SPIRIVA) 18 MCG inhalation capsule Place 18 mcg into inhaler and inhale daily.    . cephALEXin (KEFLEX) 500 MG capsule Take 1 capsule (500 mg total) by mouth 2 (two) times daily. (Patient not taking: Reported on 02-Apr-2016) 10 capsule 0  . cyclobenzaprine (FLEXERIL) 5 MG tablet Take 1 tablet (5 mg total) by mouth 3 (three) times daily as needed for muscle spasms. (Patient not taking: Reported on 2016/04/02) 30 tablet 0    Results for orders placed or performed during the hospital encounter of 2016/04/02 (from the past 48 hour(s))  Comprehensive metabolic panel     Status: Abnormal   Collection Time: 04/02/16  2:35 AM  Result Value Ref Range   Sodium 132 (L) 135 - 145 mmol/L   Potassium 3.8 3.5 - 5.1 mmol/L   Chloride 99 (L) 101 - 111 mmol/L   CO2 27 22 - 32 mmol/L   Glucose, Bld 148 (H) 65 - 99 mg/dL   BUN 24 (H) 6 - 20 mg/dL   Creatinine, Ser 0.89 0.44 - 1.00 mg/dL   Calcium 8.1 (L) 8.9 - 10.3 mg/dL   Total Protein 7.0 6.5 - 8.1 g/dL   Albumin 2.8 (L) 3.5 - 5.0 g/dL   AST 22 15 - 41 U/L   ALT 13 (L) 14 - 54 U/L   Alkaline Phosphatase 89 38 - 126 U/L   Total Bilirubin 0.4 0.3 - 1.2 mg/dL   GFR calc non Af Amer 57 (L) >60 mL/min   GFR calc Af Amer >60 >60 mL/min    Comment: (NOTE) The eGFR has been calculated using the CKD EPI equation. This calculation has not been validated in all clinical situations. eGFR's persistently <60 mL/min signify possible Chronic Kidney Disease.    Anion gap 6 5 - 15  Troponin I     Status: Abnormal   Collection Time: 2016-04-02  2:35 AM  Result Value Ref Range   Troponin I 0.03 (HH) <0.03 ng/mL    Comment: CRITICAL RESULT CALLED TO, READ BACK BY AND VERIFIED WITH ALLISON PATE ON Apr 02, 2016 AT 0311 BY TLB   Brain natriuretic  peptide     Status: Abnormal   Collection Time: 04-02-16  2:35 AM  Result Value Ref Range   B Natriuretic Peptide 143.0 (H) 0.0 - 100.0 pg/mL  Blood gas, venous     Status: Abnormal   Collection Time:  April 18, 2016  2:35 AM  Result Value Ref Range   pH, Ven 7.42 7.250 - 7.430   pCO2, Ven 46 44.0 - 60.0 mmHg   pO2, Ven 50.0 (H) 32.0 - 45.0 mmHg   Bicarbonate 29.8 (H) 20.0 - 28.0 mmol/L   Acid-Base Excess 4.7 (H) 0.0 - 2.0 mmol/L   O2 Saturation 85.8 %   Patient temperature 37.0    Collection site VEIN    Sample type VENOUS   Influenza panel by PCR (type A & B)     Status: None   Collection Time: Apr 18, 2016  2:35 AM  Result Value Ref Range   Influenza A By PCR NEGATIVE NEGATIVE   Influenza B By PCR NEGATIVE NEGATIVE    Comment: (NOTE) The Xpert Xpress Flu assay is intended as an aid in the diagnosis of  influenza and should not be used as a sole basis for treatment.  This  assay is FDA approved for nasopharyngeal swab specimens only. Nasal  washings and aspirates are unacceptable for Xpert Xpress Flu testing.   CBC     Status: Abnormal   Collection Time: 04-18-16  2:35 AM  Result Value Ref Range   WBC 14.4 (H) 3.6 - 11.0 K/uL   RBC 3.01 (L) 3.80 - 5.20 MIL/uL   Hemoglobin 9.2 (L) 12.0 - 16.0 g/dL   HCT 27.8 (L) 35.0 - 47.0 %   MCV 92.3 80.0 - 100.0 fL   MCH 30.5 26.0 - 34.0 pg   MCHC 33.1 32.0 - 36.0 g/dL   RDW 16.8 (H) 11.5 - 14.5 %   Platelets 223 150 - 440 K/uL    Comment: COUNT MAY BE INACCURATE DUE TO FIBRIN CLUMPS.  TSH     Status: None   Collection Time: 18-Apr-2016  2:35 AM  Result Value Ref Range   TSH 0.755 0.350 - 4.500 uIU/mL    Comment: Performed by a 3rd Generation assay with a functional sensitivity of <=0.01 uIU/mL.   Dg Chest Portable 1 View  Result Date: 2016-04-18 CLINICAL DATA:  Hypoxia and dyspnea.  Nonproductive cough. EXAM: PORTABLE CHEST 1 VIEW COMPARISON:  03/15/2016 FINDINGS: There are intact appearances of the transvenous cardiac leads. Stable severe  emphysematous disease involves both upper lobes. Increased prominence of the interstitium bilaterally, possibly representing a degree of interstitial edema. No confluent airspace consolidation. No effusion. IMPRESSION: Severe emphysematous disease. Increased interstitial prominence may represent a component of interstitial edema superimposed on the severe chronic changes. Electronically Signed   By: Andreas Newport M.D.   On: 04-18-2016 03:37    Review of Systems  Unable to perform ROS: Acuity of condition    Blood pressure (!) 78/46, pulse 87, temperature 98.7 F (37.1 C), resp. rate (!) 22, height 5' 3"  (1.6 m), weight 75.3 kg (166 lb), SpO2 100 %. Physical Exam  Vitals reviewed. Constitutional: She is oriented to person, place, and time. She appears well-developed and well-nourished. No distress.  HENT:  Head: Normocephalic and atraumatic.  Mouth/Throat: Oropharynx is clear and moist.  Eyes: Conjunctivae and EOM are normal. Pupils are equal, round, and reactive to light. No scleral icterus.  Neck: Normal range of motion. Neck supple. No JVD present. No tracheal deviation present. No thyromegaly present.  Cardiovascular: Normal rate, regular rhythm and normal heart sounds.  Exam reveals no gallop and no friction rub.   No murmur heard. Respiratory: Effort normal. She has decreased breath sounds.  GI: Soft. Bowel sounds are normal. She exhibits no distension. There is no tenderness.  Genitourinary:  Genitourinary Comments: Deferred  Musculoskeletal: Normal range of motion. She exhibits no edema.  Lymphadenopathy:    She has no cervical adenopathy.  Neurological: She is alert and oriented to person, place, and time. No cranial nerve deficit. She exhibits normal muscle tone.  Skin: Skin is warm and dry. No rash noted. No erythema.  Psychiatric:  Difficult to assess due to general debility and end-of-life stages     Assessment/Plan This is an 83 year old female admitted for  compression fracture of her vertebrae. 1. Compression fracture: new; the patient suffered previous compression fractures a few months ago. Goal is to manage pain. 2. COPD: Emphysematous; I've continue the patient's inhalers for now. She is also on supplemental oxygen. Would recommend morphine for air hunger. 3. CHF: The patient has a pacemaker that will need to be turned off. 4. Atrial fibrillation: Rate controlled; the patient has elected not to be on anticoagulation 5. Urinary tract infection: Recurrent; present on admission. I have ordered ceftriaxone. 6. Palliative care: The patient appears to be in the stages of end-of-life. We will provide supportive care and comfort measures after the patient and family meet with our palliative care staff. 7. DVT prophylaxis: SCDs 8. GI prophylaxis: Pantoprazole per home regimen The patient is a DO NOT RESUSCITATE. Time spent on admission orders and patient care approximately 45 minutes  Harrie Foreman, MD 04-08-2016, 8:36 AM

## 2016-03-28 NOTE — Telephone Encounter (Signed)
Not sure what this means---had not heard that she was ready for discharge. Will look into this at [facility] this afternoon

## 2016-03-28 NOTE — NC FL2 (Signed)
[place] MEDICAID FL2 LEVEL OF CARE SCREENING TOOL     IDENTIFICATION  Patient Name: [name] Birthdate: 04/29/1927 Sex: female Admission Date (Current Location): 12-Apr-2016  [place] and [place] Number:  Chiropodist and Address:  [facility], [address] [place], [place] [address]      Provider Number: [id number]  Attending Physician Name and Address:  Auburn Bilberry, MD  Relative Name and Phone Number:       Current Level of Care: Hospital Recommended Level of Care: Skilled Nursing Facility Prior Approval Number:    Date Approved/Denied:   PASRR Number:  [id number])  Discharge Plan: SNF    Current Diagnoses: Patient Active Problem List   Diagnosis Date Noted  . SOB (shortness of breath) 2016/04/12  . Thoracic compression fracture, closed, initial encounter (HCC) 03/03/2016  . SBO (small bowel obstruction) 01/11/2016  . Small bowel obstruction 10/11/2014  . Intestinal obstruction due to adhesions 07/28/2014    Orientation RESPIRATION BLADDER Height & Weight     Self, Time, Situation, Place  O2 (Nasal Cannula 4L/min) Continent Weight: 166 lb (75.3 kg) Height:  5\' 3"  (160 cm)  BEHAVIORAL SYMPTOMS/MOOD NEUROLOGICAL BOWEL NUTRITION STATUS   (None. )  (None.) Continent Diet (Diet: Regular)  AMBULATORY STATUS COMMUNICATION OF NEEDS Skin   Limited Assist Verbally Normal                       Personal Care Assistance Level of Assistance  Bathing, Feeding, Dressing Bathing Assistance: Limited assistance Feeding assistance: Independent Dressing Assistance: Limited assistance     Functional Limitations Info  Sight, Hearing, Speech Sight Info: Adequate Hearing Info: Adequate Speech Info: Adequate    SPECIAL CARE FACTORS FREQUENCY  PT (By licensed PT), OT (By licensed OT)     PT Frequency:  (5) OT Frequency:  (5)            Contractures      Additional Factors Info  Code Status, Allergies Code Status  Info:  (DNR) Allergies Info:  (Toprol Xl Metoprolol, Ace Inhibitors, Diltiazem Hcl)           Current Medications (April 12, 2016):  This is the current hospital active medication list Current Facility-Administered Medications  Medication Dose Route Frequency Provider Last Rate Last Dose  . acetaminophen (TYLENOL) tablet 650 mg  650 mg Oral Q6H PRN Arnaldo Natal, MD       Or  . acetaminophen (TYLENOL) suppository 650 mg  650 mg Rectal Q6H PRN Arnaldo Natal, MD      . albuterol (PROVENTIL) (2.5 MG/3ML) 0.083% nebulizer solution 2.5 mg  2.5 mg Nebulization Q2H PRN Arnaldo Natal, MD      . amitriptyline (ELAVIL) tablet 75 mg  75 mg Oral QHS Arnaldo Natal, MD      . aspirin chewable tablet 81 mg  81 mg Oral Daily Arnaldo Natal, MD      . cefTRIAXone (ROCEPHIN) IVPB 1 g  1 g Intravenous Q24H Auburn Bilberry, MD      . digoxin (LANOXIN) tablet 0.125 mg  0.125 mg Oral Daily Arnaldo Natal, MD      . docusate sodium (COLACE) capsule 100 mg  100 mg Oral BID Arnaldo Natal, MD      . furosemide (LASIX) tablet 20 mg  20 mg Oral Daily Arnaldo Natal, MD      . LORazepam (ATIVAN) tablet 1 mg  1 mg Oral Q4H PRN Kelton Pillar  Sheryle Hail, MD       Or  . LORazepam (ATIVAN) 2 MG/ML concentrated solution 1 mg  1 mg Sublingual Q4H PRN Arnaldo Natal, MD       Or  . LORazepam (ATIVAN) injection 1 mg  1 mg Intravenous Q4H PRN Arnaldo Natal, MD      . mometasone-formoterol [facility]) 100-5 MCG/ACT inhaler 2 puff  2 puff Inhalation BID Arnaldo Natal, MD      . morphine 2 MG/ML injection 1 mg  1 mg Intravenous Q2H PRN Arnaldo Natal, MD   1 mg at 09-Apr-2016 0855  . morphine 250 mg in sodium chloride 0.9 % 250 mL (1 mg/mL) infusion  2 mg/hr Intravenous Continuous Rebecka Apley, MD 2 mL/hr at 04-09-16 0800 2 mg/hr at 2016-04-09 0800  . ondansetron (ZOFRAN) tablet 4 mg  4 mg Oral Q6H PRN Arnaldo Natal, MD       Or  . ondansetron [facility]) injection 4 mg  4 mg Intravenous Q6H PRN  Arnaldo Natal, MD      . oseltamivir (TAMIFLU) capsule 75 mg  75 mg Oral Daily Arnaldo Natal, MD      . oxyCODONE (Oxy IR/ROXICODONE) immediate release tablet 5 mg  5 mg Oral Q12H PRN Arnaldo Natal, MD      . pantoprazole (PROTONIX) EC tablet 40 mg  40 mg Oral Daily Arnaldo Natal, MD      . senna-docusate (Senokot-S) tablet 2 tablet  2 tablet Oral QHS PRN Arnaldo Natal, MD      . tiotropium [facility]) inhalation capsule 18 mcg  18 mcg Inhalation Daily Arnaldo Natal, MD         Discharge Medications: Please see discharge summary for a list of discharge medications.  Relevant Imaging Results:  Relevant Lab Results:   Additional Information  (SSN: [id number])  Ralene Bathe, Student-Social Work

## 2016-03-28 NOTE — Progress Notes (Signed)
   Apr 13, 2016 0800  Clinical Encounter Type  Visited With Family  Visit Type Patient actively dying;Initial  Referral From Nurse  Spiritual Encounters  Spiritual Needs Emotional;Grief support  CH responded to spiritual consult for end of life; CH spoke with family who indicated no need at present and that family Renato Gails will be there shortly and will contact if needed. Erline Levine 8:12 AM

## 2016-03-28 NOTE — Progress Notes (Signed)
Patient's was seen by palliative care team and has been made comfort care. Patient seen with multiple family members in room. She appears comfortable. Continue with comfort care measures.

## 2016-03-28 NOTE — Telephone Encounter (Signed)
PLEASE NOTE: All timestamps contained within this report are represented as [place] Standard Time. CONFIDENTIALTY NOTICE: This fax transmission is intended only for the addressee. It contains information that is legally privileged, confidential or otherwise protected from use or disclosure. If you are not the intended recipient, you are strictly prohibited from reviewing, disclosing, copying using or disseminating any of this information or taking any action in reliance on or regarding this information. If you have received this fax in error, please notify us immediately by telephone so that we can arrange for its return to [place]. Phone: [phone], Toll-Free: [phone], Fax: [phone] Page: 1 of 1 Call Id: [id number] [facility] Night - Client Nonclinical Telephone Record [facility] Call Center Client [facility] Night - Client Client Site [facility] - Night Physician Tillman Abide - MD Contact Type Call Who Is Calling Physician / Provider / Hospital Call Type Provider Call [facility] Page Now Reason for Call Request for Discharge Orders Initial Comment Needing to speak with the on call for orders to send home Additional Comment Patient Name [name] Patient DOB January 01, 1928 Requesting Provider [facility] Physician Number [phone] Facility Name [facility] Phone DateTime Result/Outcome Message Type Notes Cheryll Cockayne - MD [phone] 04/04/16 1:37:12 AM Called On Call Provider - Reached Doctor Paged Cheryll Cockayne - MD Apr 04, 2016 1:37:55 AM Spoke with On Call - General Message Result Call Closed By: Vicki Mallet Transaction Date/Time: 04/04/16 1:32:41 AM (ET)

## 2016-03-28 NOTE — ED Provider Notes (Signed)
[facility] Emergency Department Provider Note   ____________________________________________   First MD Initiated Contact with Patient Apr 14, 2016 0221     (approximate)  I have reviewed the triage vital signs and the nursing notes.   HISTORY  Chief Complaint Shortness of Breath    HPI [name] is a 83 y.o. female who is at [facility] for rehabilitation. The patient's son reports that she had increased respiratory distress during the night yesterday. He reports that she's also been confused more than normal. At around 8:30 her sats were 80%. She initially stated that she did not want to come to the hospital and then about an hour before she arrived she stated that she didn't want to come. The patient's son reports that she fell on the seventh and had a T7 compression fracture. She was also here for 3 days and then transferred to [facility]. After 3 days there she had a GI bug that developed into a bowel obstruction and was only recently discharged back to [facility]. The patient has had a cough but no significant temperature. It was 99.2. The patient's son reports that prior to coming into the hospital she was barely conscious in her abdomen seemed distended. The patient is here for evaluation.She denies  chest pain at this time.   Past Medical History:  Diagnosis Date  . AICD (automatic cardioverter/defibrillator) present   . Anemia   . Arthritis   . Asthma   . Atrial fibrillation (HCC)    elected not to take coumadin  . Bacteremia 2007   asystole pacemaker failed to capture due to lead wire  . Bowel obstruction    multiple  . Cardiomyopathy (HCC) 1990  . CHF (congestive heart failure) (HCC)   . COPD (chronic obstructive pulmonary disease) (HCC)   . Diverticulosis   . Emphysema of lung (HCC)   . GERD (gastroesophageal reflux disease)   . History of IBS   . Multiple gastric ulcers    in recent years  . Presence of permanent cardiac pacemaker   .  Restless leg syndrome   . Shortness of breath dyspnea    with exertion  . 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 Ulcerative colitis (HCC)    2007    Patient Active Problem List   Diagnosis Date Noted  . SOB (shortness of breath) 14-Apr-2016  . Thoracic compression fracture, closed, initial encounter (HCC) 03/03/2016  . SBO (small bowel obstruction) 01/11/2016  . Small bowel obstruction 10/11/2014  . 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 ABDOMINAL HYSTERECTOMY  1990  . APPENDECTOMY  1990  . CARDIAC CATHETERIZATION    . CHOLECYSTECTOMY    . COLON SURGERY    . COLOSTOMY Left 1992  . COLOSTOMY TAKEDOWN  1993  . EYE SURGERY Bilateral    Cataract Extraction with IOL  . IMPLANTABLE CARDIOVERTER DEFIBRILLATOR (ICD) GENERATOR CHANGE N/A 05/26/2015   Procedure: ICD GENERATOR CHANGE;  Surgeon: Sharion Settler, MD;  Location: [facility] ORS;  Service: Cardiovascular;  Laterality: N/A;  . PACEMAKER INSERTION  2003  . VENTRICULAR CARDIAC PACEMAKER INSERTION  2007   biventricular pacer with defib implanted 2007 concerto [id number] serial [id number]    Prior to Admission medications   Medication Sig Start Date End Date Taking? Authorizing Provider  albuterol (PROVENTIL HFA;VENTOLIN HFA) 108 (90 BASE) MCG/ACT inhaler Inhale 2 puffs into the lungs 2 (two) times  daily.   Yes Historical Provider, MD  amitriptyline (ELAVIL) 75 MG tablet Take 75 mg by mouth at bedtime.   Yes Historical Provider, MD  aspirin 81 MG chewable tablet Chew 81 mg by mouth daily.   Yes Historical Provider, MD  celecoxib (CELEBREX) 200 MG capsule Take 200 mg by mouth daily.    Yes Historical Provider, MD  digoxin (LANOXIN) 0.125 MG tablet Take 0.125 mg by mouth daily.   Yes Historical Provider, MD  Fluticasone-Salmeterol (ADVAIR) 100-50 MCG/DOSE AEPB Inhale 1 puff into the lungs 2 (two) times daily.   Yes Historical Provider, MD  furosemide  (LASIX) 20 MG tablet Take 20 mg by mouth daily.    Yes Historical Provider, MD  ibuprofen (ADVIL,MOTRIN) 400 MG tablet Take 1 tablet (400 mg total) by mouth every 6 (six) hours as needed. 03/06/16  Yes Vipul Sherryll Burger, MD  losartan (COZAAR) 25 MG tablet Take 25 mg by mouth daily.   Yes Historical Provider, MD  omeprazole (PRILOSEC) 20 MG capsule Take 20 mg by mouth daily.    Yes Historical Provider, MD  oseltamivir (TAMIFLU) 75 MG capsule Take 75 mg by mouth daily. 03/21/16 03/28/16 Yes Historical Provider, MD  oxyCODONE (OXY IR/ROXICODONE) 5 MG immediate release tablet Take 1 tablet (5 mg total) by mouth every 12 (twelve) hours as needed for moderate pain or severe pain. 03/06/16  Yes Vipul Sherryll Burger, MD  polyethylene glycol (MIRALAX / GLYCOLAX) packet Take 17 g by mouth daily.   Yes Historical Provider, MD  senna-docusate (SENOKOT-S) 8.6-50 MG tablet Take 2 tablets by mouth at bedtime as needed for mild constipation. 03/06/16  Yes Vipul Sherryll Burger, MD  tiotropium (SPIRIVA) 18 MCG inhalation capsule Place 18 mcg into inhaler and inhale daily.   Yes Historical Provider, MD  cephALEXin (KEFLEX) 500 MG capsule Take 1 capsule (500 mg total) by mouth 2 (two) times daily. Patient not taking: Reported on 04-15-2016 03/18/16   [name], MD  cyclobenzaprine (FLEXERIL) 5 MG tablet Take 1 tablet (5 mg total) by mouth 3 (three) times daily as needed for muscle spasms. Patient not taking: Reported on Apr 15, 2016 03/06/16   Delfino Lovett, MD    Allergies Toprol xl [metoprolol]; Ace inhibitors; and Diltiazem hcl  Family History  Problem Relation Age of Onset  . Cancer Mother     Uterine  . Diabetes Father   . Heart disease Father     Social History Social History  Substance Use Topics  . Smoking status: Former Smoker    Packs/day: 1.00    Types: Cigarettes    Quit date: 03/28/2005  . Smokeless tobacco: Never Used  . Alcohol use Yes     Comment: occ    Review of Systems Constitutional: No fever/chills Eyes: No  visual changes. ENT: No sore throat. Cardiovascular: Denies chest pain. Respiratory: Cough and shortness of breath. Gastrointestinal: No abdominal pain.  No nausea, no vomiting.  No diarrhea.  No constipation. Genitourinary: Negative for dysuria. Musculoskeletal: Negative for back pain. Skin: Negative for rash. Neurological: Negative for headaches, focal weakness or numbness.  10-point ROS otherwise negative.  ____________________________________________   PHYSICAL EXAM:  VITAL SIGNS: ED Triage Vitals  Enc Vitals Group     BP 04/15/16 0230 102/75     Pulse Rate 04-15-2016 0230 97     Resp Apr 15, 2016 0256 (!) 22     Temp 04-15-2016 0256 98.1 F (36.7 C)     Temp Source 04/15/2016 0256 Oral     SpO2 April 15, 2016 0220 99 %  Weight 04-18-16 0256 166 lb (75.3 kg)     Height 2016/04/18 0256 5\' 3"  (1.6 m)     Head Circumference --      Peak Flow --      Pain Score --      Pain Loc --      Pain Edu? --      Excl. in GC? --     Constitutional: Alert and oriented. Ill appearing and in some moderate distress. Eyes: Conjunctivae are normal. PERRL. EOMI. Head: Atraumatic. Nose: No congestion/rhinnorhea. Mouth/Throat: Mucous membranes are moist.  Oropharynx non-erythematous. Cardiovascular: Normal rate, regular rhythm. Grossly normal heart sounds.  Good peripheral circulation. Respiratory: Mildly Increased respiratory effort. Suprasternal retractions. Crackles in all lung fields.  Gastrointestinal: Soft and nontender. No distention. Positive bowel sounds Musculoskeletal: No lower extremity tenderness nor edema.  No joint effusions. Neurologic:  Normal speech and language. No gross focal neurologic deficits are appreciated. No gait instability. Skin:  Skin is warm, dry and intact. No rash noted. Psychiatric: Mood and affect are normal. Speech and behavior are normal.  ____________________________________________   LABS (all labs ordered are listed, but only abnormal results are  displayed)  Labs Reviewed  COMPREHENSIVE METABOLIC PANEL - Abnormal; Notable for the following:       Result Value   Sodium 132 (*)    Chloride 99 (*)    Glucose, Bld 148 (*)    BUN 24 (*)    Calcium 8.1 (*)    Albumin 2.8 (*)    ALT 13 (*)    GFR calc non Af Amer 57 (*)    All other components within normal limits  TROPONIN I - Abnormal; Notable for the following:    Troponin I 0.03 (*)    All other components within normal limits  BRAIN NATRIURETIC PEPTIDE - Abnormal; Notable for the following:    B Natriuretic Peptide 143.0 (*)    All other components within normal limits  BLOOD GAS, VENOUS - Abnormal; Notable for the following:    pO2, Ven 50.0 (*)    Bicarbonate 29.8 (*)    Acid-Base Excess 4.7 (*)    All other components within normal limits  CBC - Abnormal; Notable for the following:    WBC 14.4 (*)    RBC 3.01 (*)    Hemoglobin 9.2 (*)    HCT 27.8 (*)    RDW 16.8 (*)    All other components within normal limits  INFLUENZA PANEL BY PCR (TYPE A & B)   ____________________________________________  EKG  none ____________________________________________  RADIOLOGY   ____________________________________________   PROCEDURES  Procedure(s) performed: None  Procedures  Critical Care performed: No  ____________________________________________   INITIAL IMPRESSION / ASSESSMENT AND PLAN / ED COURSE  Pertinent labs & imaging results that were available during my care of the patient were reviewed by me and considered in my medical decision making (see chart for details).  This is an 83 year old female who comes into the hospital today with some hypoxia and respiratory distress. The patient has been admitted to the hospital multiple times with bowel obstructions and a compression fracture of her thoracic spine. When the patient arrived her son reports that she was looking improved and that she was more awake. The patient's son became frustrated as I was  awaiting the results of her blood work and her imaging study. I did order a dose of DuoNeb as well as some Lasix. The patient also did receive a dose of Ativan 0.5 mg  orally as she told the nurse she was becoming agitated and anxious.  Did arrive back into the room to discuss the results of the x-rays and blood work with the patient's son. The patient's x-ray shows some interstitial edema. The patient's son was frustrated because he felt nothing had been done for his mom and he wanted her to be comfortable. I did offer to place the patient on BiPAP given the crackles and the sonorous/gurgling respirations. The patient was more somnolent after the Ativan. The patient's son reports that he does not want her on BiPAP and he did not want her intubated. He reports that he just wants her comfortable. I did have a conversation with the patient's son about giving her morphine and he reports that even if she needs a drip he just wants her comfortable. He reports that she is complaining of pain in her back and her hips as well as under her ribs. The decision was made to make the patient comfort care and to admit her to the hospitalist service. The patient will be admitted to the hospital for comfort care.   Clinical Course as of Mar 26 656  Tue 04/05/16  1610 Severe emphysematous disease. Increased interstitial prominence may represent a component of interstitial edema superimposed on the severe chronic changes.   DG Chest Portable 1 View [AW]    Clinical Course User Index [AW] Rebecka Apley, MD     ____________________________________________   FINAL CLINICAL IMPRESSION(S) / ED DIAGNOSES  Final diagnoses:  Shortness of breath  Hypoxia  Comfort measures only status      NEW MEDICATIONS STARTED DURING THIS VISIT:  New Prescriptions   No medications on file     Note:  This document was prepared using Dragon voice recognition software and may include unintentional dictation  errors.    Rebecka Apley, MD 04/05/16 [phone]

## 2016-03-28 NOTE — Plan of Care (Signed)
Problem: Pain Managment: Goal: General experience of comfort will improve Outcome: Progressing Morphine and versed  Drip pt comfort care. Family at bedside  Problem: Activity: Goal: Risk for activity intolerance will decrease Outcome: Progressing bedrest  Problem: Nutrition: Goal: Adequate nutrition will be maintained Outcome: Not Progressing Not able to eat  Problem: Role Relationship: Goal: Familys ability to cope with current situation will improve Outcome: Progressing Pall  Spoke with pts family

## 2016-03-28 DEATH — deceased

## 2016-04-25 NOTE — Discharge Summary (Signed)
[facility] [place] [facility] Date of Admission: Apr 25, 2016  2:19 AM  Date of death: 04/25/16  Admitting diagnosis:   Acute respiratory failure Back pain due to compression fracture COPD    Diagnosis at time of death  1.acute respiratory failure due to chf 2.compression fractures 3. Chronic Copd  4. Chronic CHF type unknown 5. Atrial fibrillation chronic 6. UTI present on [facility] course  The patient with past medical history of CHF, atrial fibrillation and emphysema presents emergency department due to shortness of breath. The patient also complains of pain in her back and her hips. She is barely verbal secondary to generalized debility but her sons are with her who are her caregivers with whom she communicated when she was somewhat stronger. Her son who is a nurse states the patient has appeared air hungry in the recently.          TOTAL TIME TAKING CARE OF THIS PATIENT:35 minutes.    Auburn Bilberry M.D on 03/29/2016 at 5:53 PM  Between 7am to 6pm - Pager - [phone]  After 6pm go to [email or web address] - password [email or web address] [facility]  [facility]  Office  [phone]  CC: Primary care physician; Lynnea Ferrier, MD

## 2017-03-30 IMAGING — CT CT T SPINE W/O CM
3 series · 10 of 33 positions shown, 11 images · non-contrast
Comparison: Thoracic radiographs 03/03/2016

CLINICAL DATA: Fall.  Back pain.  Fracture thoracic spine

EXAM:
CT THORACIC SPINE WITHOUT CONTRAST
TECHNIQUE: Multidetector CT images of the thoracic were obtained using the
standard protocol without intravenous contrast.

[Series 4: t spine soft · axial · 0.33mm/px · z∈[-48,+100]mm · 2 of 160 slices shown, 3 images]
[im 49/160  soft-tissue]
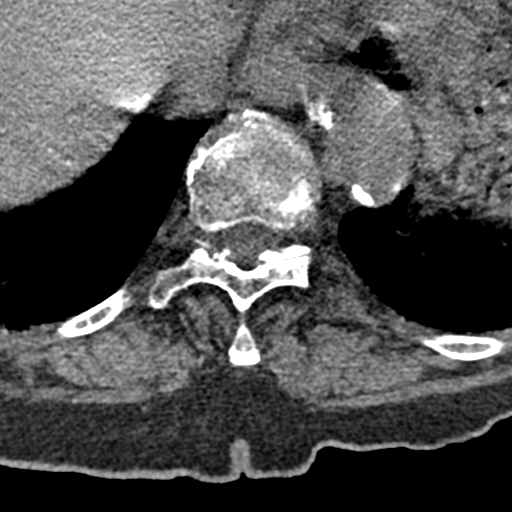
[im 49/160  bone]
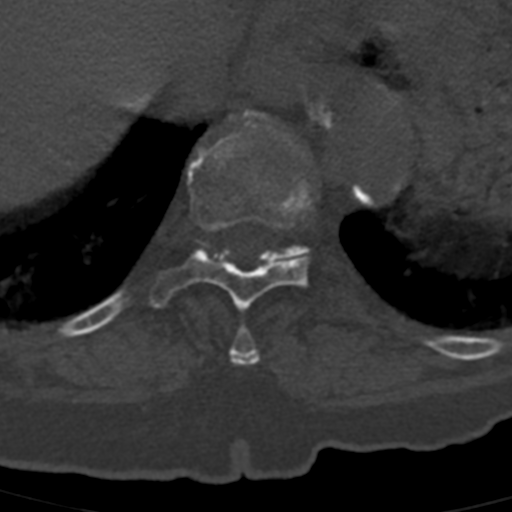
[im 123/160  bone]
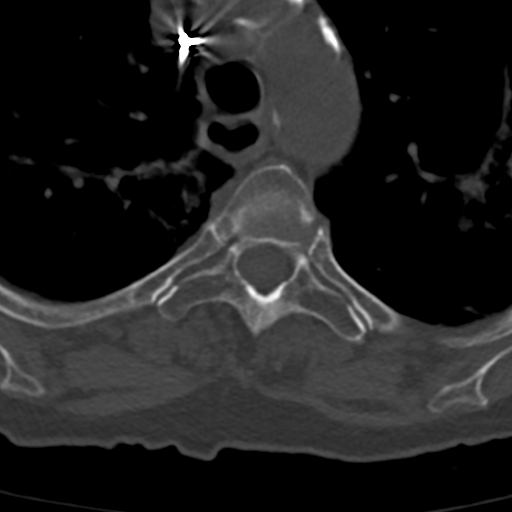

[Series 6: sagittal bone · sagittal · 0.33mm/px · 5 of 63 slices shown]
[im 21/63  bone]
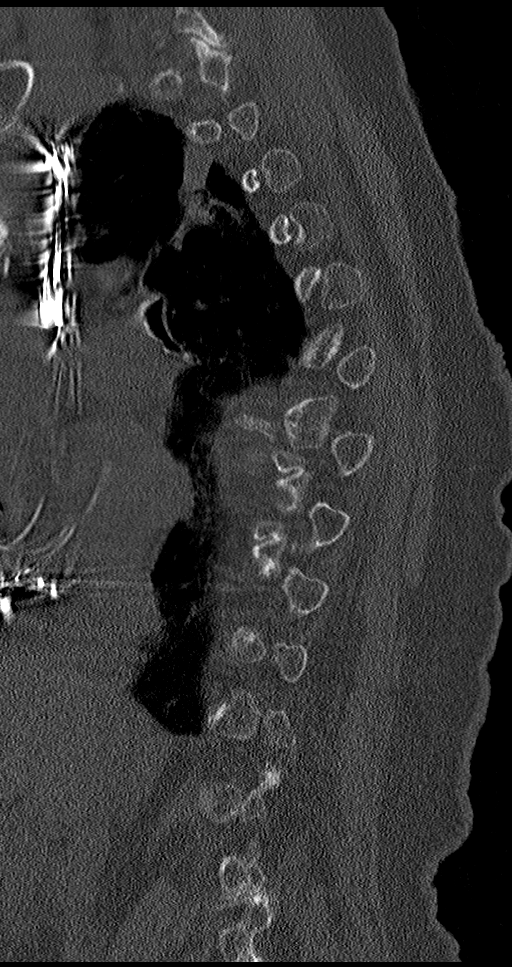
[im 26/63  bone]
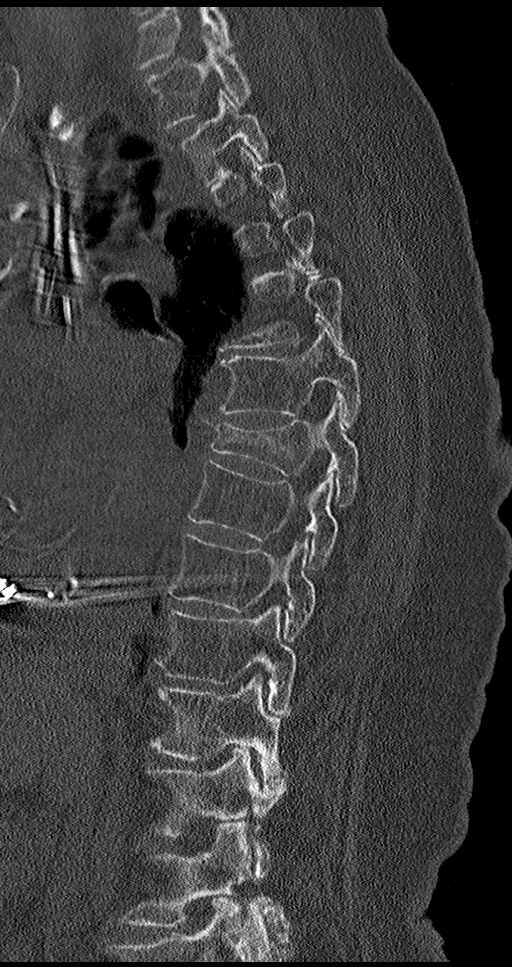
[im 32/63  bone]
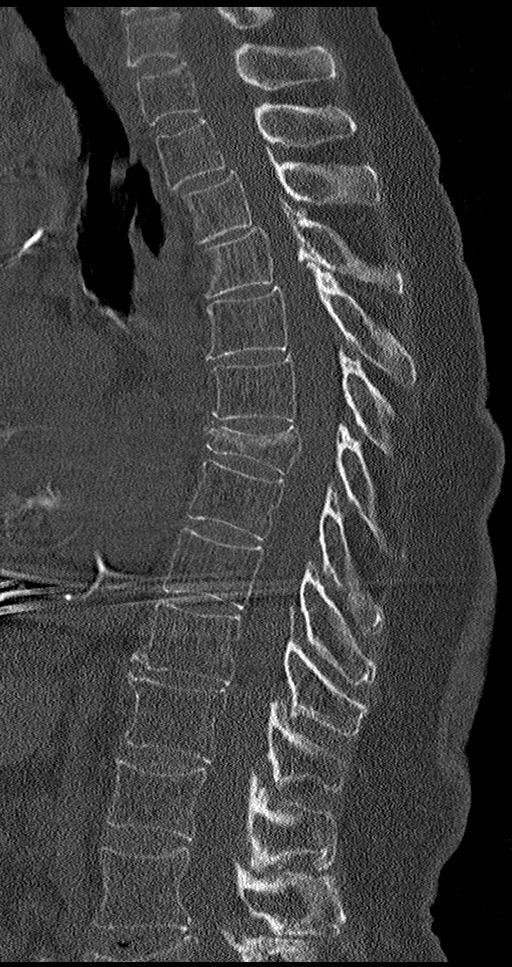
[im 37/63  bone]
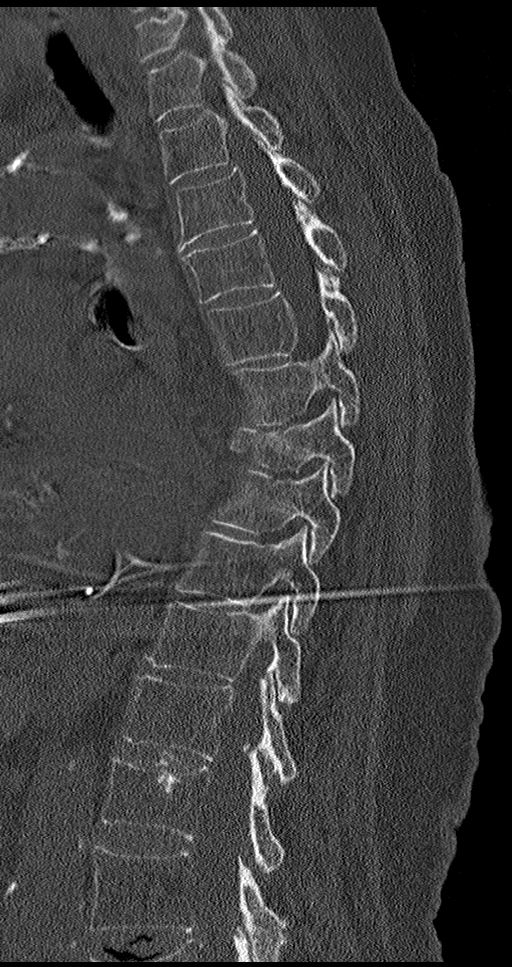
[im 42/63  bone]
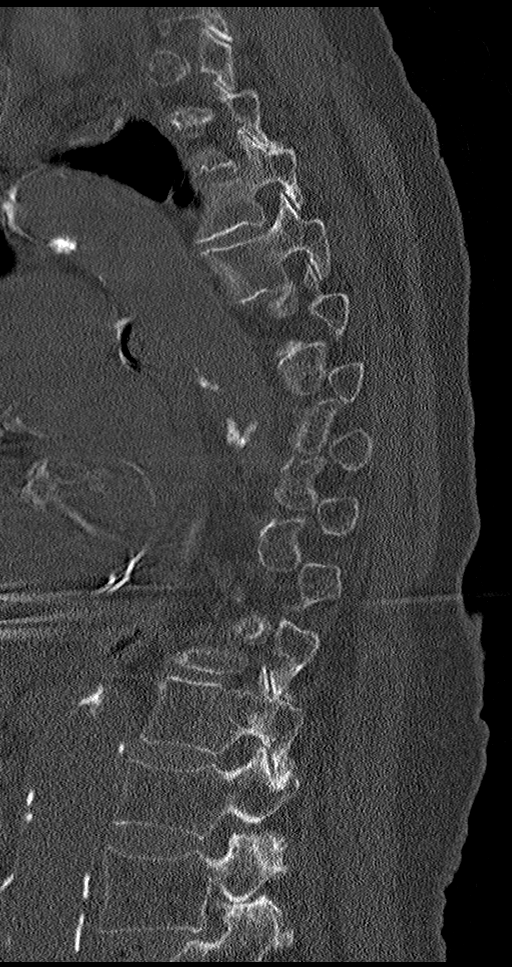

[Series 7: coronal bone · coronal · 0.24mm/px · 3 of 86 slices shown]
[im 18/86  bone]
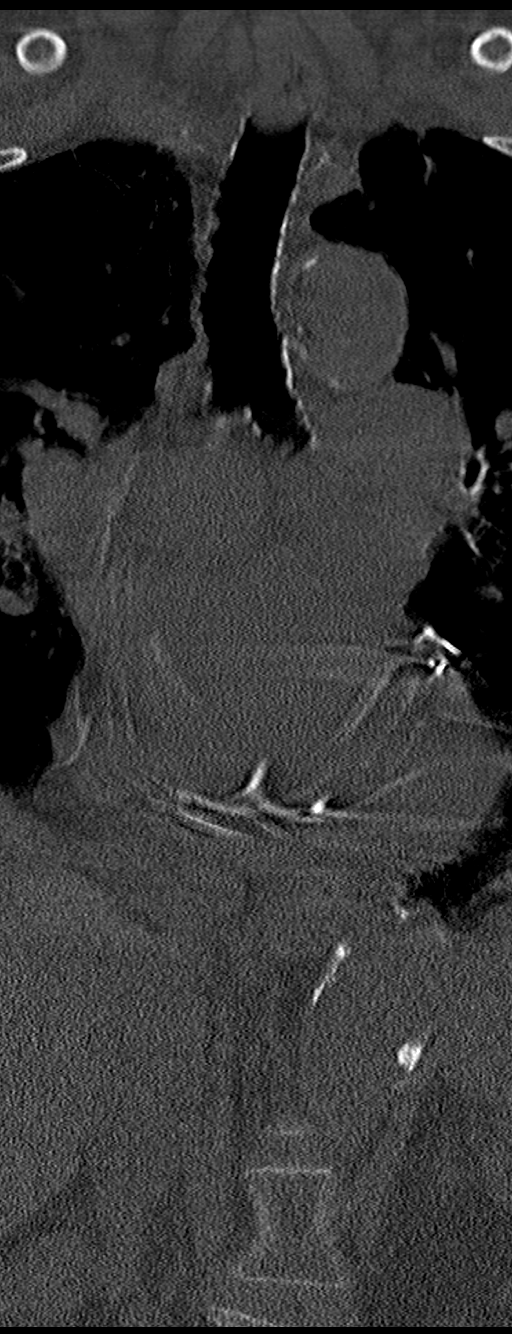
[im 35/86  bone]
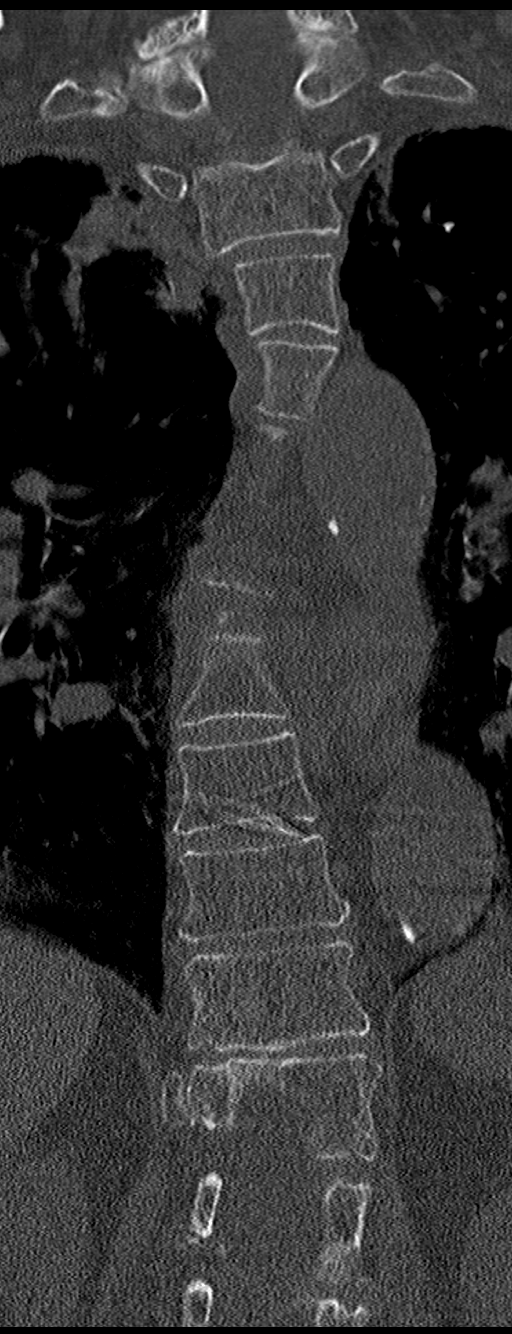
[im 52/86  bone]
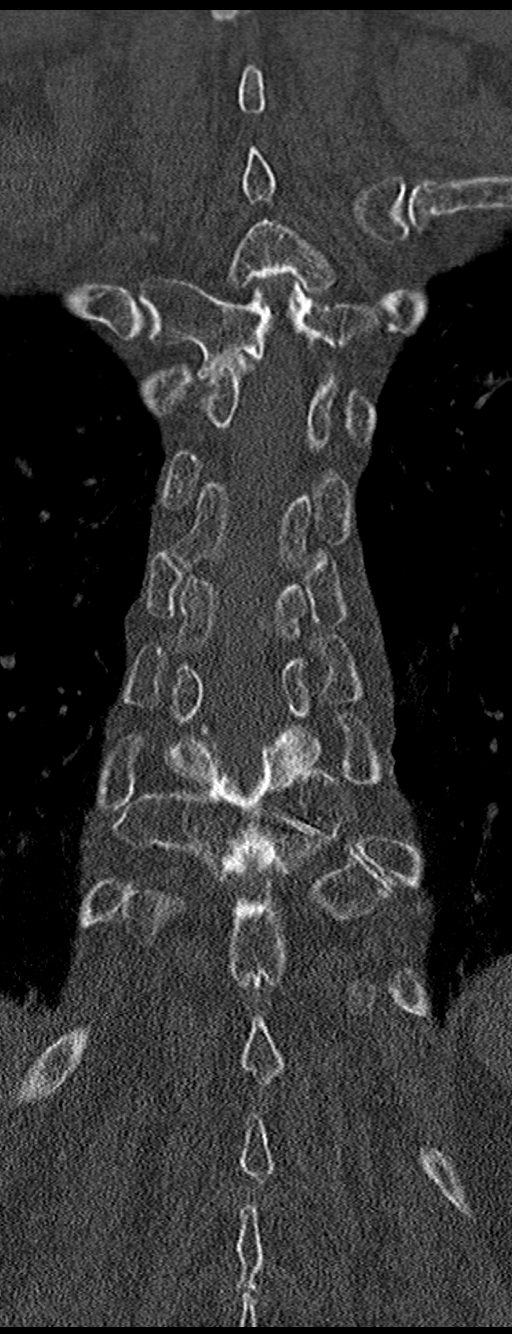

[10 of 33 positions shown; findings below may reference images not displayed]

FINDINGS: Alignment: Normal alignment.  Thoracic scoliosis.

Vertebrae: Moderately severe fracture T7 which is probably acute.
There is paraspinous soft tissue swelling compatible with hemorrhage
from recent fracture. Mild irregularity of the endplates. Mild
retropulsion of bone into the canal without significant spinal
stenosis. No other fracture

Paraspinal and other soft tissues: Mild paraspinous soft tissue
thickening around the T7 fracture compatible with edema and
hemorrhage from recent fracture.

Disc levels: Mild thoracic disc degeneration and facet degeneration.
No significant spinal stenosis.

Apical emphysema and scarring in the lungs. No pleural effusion.
Atherosclerotic aorta. Pacemaker leads.
IMPRESSION: Moderate compression fracture T7 appears acute. Mild retropulsion of
bone into the canal without significant spinal stenosis. No other
fracture.

Thoracic scoliosis
